# Patient Record
Sex: Male | Born: 1935 | Race: White | Hispanic: No | Marital: Married | Smoking: Former smoker
Health system: Southern US, Community
[De-identification: ages and names within clinical notes are randomized; demographics above are authoritative.]

## PROBLEM LIST (undated history)

## (undated) DIAGNOSIS — K219 Gastro-esophageal reflux disease without esophagitis: Secondary | ICD-10-CM

## (undated) DIAGNOSIS — I639 Cerebral infarction, unspecified: Secondary | ICD-10-CM

## (undated) DIAGNOSIS — R011 Cardiac murmur, unspecified: Secondary | ICD-10-CM

## (undated) DIAGNOSIS — F0390 Unspecified dementia without behavioral disturbance: Secondary | ICD-10-CM

## (undated) DIAGNOSIS — R319 Hematuria, unspecified: Secondary | ICD-10-CM

## (undated) DIAGNOSIS — R911 Solitary pulmonary nodule: Secondary | ICD-10-CM

## (undated) DIAGNOSIS — C801 Malignant (primary) neoplasm, unspecified: Secondary | ICD-10-CM

## (undated) HISTORY — DX: Solitary pulmonary nodule: R91.1

---

## 1968-08-31 HISTORY — PX: HERNIA REPAIR: SHX51

## 2004-08-31 HISTORY — PX: OTHER SURGICAL HISTORY: SHX169

## 2011-11-06 DIAGNOSIS — M5430 Sciatica, unspecified side: Secondary | ICD-10-CM | POA: Diagnosis not present

## 2011-11-06 DIAGNOSIS — M999 Biomechanical lesion, unspecified: Secondary | ICD-10-CM | POA: Diagnosis not present

## 2011-11-18 DIAGNOSIS — M9981 Other biomechanical lesions of cervical region: Secondary | ICD-10-CM | POA: Diagnosis not present

## 2011-11-18 DIAGNOSIS — M545 Low back pain: Secondary | ICD-10-CM | POA: Diagnosis not present

## 2011-11-18 DIAGNOSIS — M999 Biomechanical lesion, unspecified: Secondary | ICD-10-CM | POA: Diagnosis not present

## 2012-01-20 DIAGNOSIS — F068 Other specified mental disorders due to known physiological condition: Secondary | ICD-10-CM | POA: Diagnosis not present

## 2012-01-20 DIAGNOSIS — R454 Irritability and anger: Secondary | ICD-10-CM | POA: Diagnosis not present

## 2012-01-20 DIAGNOSIS — R5381 Other malaise: Secondary | ICD-10-CM | POA: Diagnosis not present

## 2012-01-21 DIAGNOSIS — C61 Malignant neoplasm of prostate: Secondary | ICD-10-CM | POA: Diagnosis not present

## 2012-01-21 DIAGNOSIS — R310 Gross hematuria: Secondary | ICD-10-CM | POA: Diagnosis not present

## 2012-04-06 DIAGNOSIS — H269 Unspecified cataract: Secondary | ICD-10-CM | POA: Diagnosis not present

## 2012-04-21 DIAGNOSIS — C61 Malignant neoplasm of prostate: Secondary | ICD-10-CM | POA: Diagnosis not present

## 2012-04-22 DIAGNOSIS — M999 Biomechanical lesion, unspecified: Secondary | ICD-10-CM | POA: Diagnosis not present

## 2012-04-22 DIAGNOSIS — M9981 Other biomechanical lesions of cervical region: Secondary | ICD-10-CM | POA: Diagnosis not present

## 2012-04-22 DIAGNOSIS — M545 Low back pain: Secondary | ICD-10-CM | POA: Diagnosis not present

## 2012-04-28 DIAGNOSIS — N529 Male erectile dysfunction, unspecified: Secondary | ICD-10-CM | POA: Diagnosis not present

## 2012-04-28 DIAGNOSIS — C61 Malignant neoplasm of prostate: Secondary | ICD-10-CM | POA: Diagnosis not present

## 2012-05-10 DIAGNOSIS — H2510 Age-related nuclear cataract, unspecified eye: Secondary | ICD-10-CM | POA: Diagnosis not present

## 2012-05-10 DIAGNOSIS — H52229 Regular astigmatism, unspecified eye: Secondary | ICD-10-CM | POA: Diagnosis not present

## 2012-05-23 DIAGNOSIS — H2510 Age-related nuclear cataract, unspecified eye: Secondary | ICD-10-CM | POA: Diagnosis not present

## 2012-06-02 DIAGNOSIS — H2510 Age-related nuclear cataract, unspecified eye: Secondary | ICD-10-CM | POA: Diagnosis not present

## 2012-06-29 DIAGNOSIS — M545 Low back pain: Secondary | ICD-10-CM | POA: Diagnosis not present

## 2012-06-29 DIAGNOSIS — M999 Biomechanical lesion, unspecified: Secondary | ICD-10-CM | POA: Diagnosis not present

## 2012-06-29 DIAGNOSIS — M9981 Other biomechanical lesions of cervical region: Secondary | ICD-10-CM | POA: Diagnosis not present

## 2012-09-09 DIAGNOSIS — N390 Urinary tract infection, site not specified: Secondary | ICD-10-CM | POA: Diagnosis not present

## 2012-09-09 DIAGNOSIS — E7800 Pure hypercholesterolemia, unspecified: Secondary | ICD-10-CM | POA: Diagnosis not present

## 2012-09-21 DIAGNOSIS — E785 Hyperlipidemia, unspecified: Secondary | ICD-10-CM | POA: Diagnosis not present

## 2012-09-21 DIAGNOSIS — Z0000 Encounter for general adult medical examination without abnormal findings: Secondary | ICD-10-CM | POA: Diagnosis not present

## 2012-09-21 DIAGNOSIS — R319 Hematuria, unspecified: Secondary | ICD-10-CM | POA: Diagnosis not present

## 2012-09-21 DIAGNOSIS — F068 Other specified mental disorders due to known physiological condition: Secondary | ICD-10-CM | POA: Diagnosis not present

## 2012-10-21 DIAGNOSIS — H01019 Ulcerative blepharitis unspecified eye, unspecified eyelid: Secondary | ICD-10-CM | POA: Diagnosis not present

## 2012-11-17 DIAGNOSIS — R42 Dizziness and giddiness: Secondary | ICD-10-CM | POA: Diagnosis not present

## 2012-11-17 DIAGNOSIS — R300 Dysuria: Secondary | ICD-10-CM | POA: Diagnosis not present

## 2012-11-17 DIAGNOSIS — N390 Urinary tract infection, site not specified: Secondary | ICD-10-CM | POA: Diagnosis not present

## 2013-01-03 DIAGNOSIS — H6980 Other specified disorders of Eustachian tube, unspecified ear: Secondary | ICD-10-CM | POA: Diagnosis not present

## 2013-01-04 DIAGNOSIS — H15059 Scleromalacia perforans, unspecified eye: Secondary | ICD-10-CM | POA: Diagnosis not present

## 2013-01-13 DIAGNOSIS — R269 Unspecified abnormalities of gait and mobility: Secondary | ICD-10-CM | POA: Diagnosis not present

## 2013-01-13 DIAGNOSIS — H903 Sensorineural hearing loss, bilateral: Secondary | ICD-10-CM | POA: Diagnosis not present

## 2013-01-13 DIAGNOSIS — J309 Allergic rhinitis, unspecified: Secondary | ICD-10-CM | POA: Diagnosis not present

## 2013-02-07 DIAGNOSIS — R1310 Dysphagia, unspecified: Secondary | ICD-10-CM | POA: Diagnosis not present

## 2013-02-20 DIAGNOSIS — L821 Other seborrheic keratosis: Secondary | ICD-10-CM | POA: Diagnosis not present

## 2013-02-20 DIAGNOSIS — D235 Other benign neoplasm of skin of trunk: Secondary | ICD-10-CM | POA: Diagnosis not present

## 2013-02-20 DIAGNOSIS — D485 Neoplasm of uncertain behavior of skin: Secondary | ICD-10-CM | POA: Diagnosis not present

## 2013-03-13 DIAGNOSIS — F0280 Dementia in other diseases classified elsewhere without behavioral disturbance: Secondary | ICD-10-CM | POA: Diagnosis not present

## 2013-03-25 DIAGNOSIS — H1130 Conjunctival hemorrhage, unspecified eye: Secondary | ICD-10-CM | POA: Diagnosis not present

## 2013-03-25 DIAGNOSIS — H01029 Squamous blepharitis unspecified eye, unspecified eyelid: Secondary | ICD-10-CM | POA: Diagnosis not present

## 2013-04-19 DIAGNOSIS — C61 Malignant neoplasm of prostate: Secondary | ICD-10-CM | POA: Diagnosis not present

## 2013-04-26 DIAGNOSIS — C61 Malignant neoplasm of prostate: Secondary | ICD-10-CM | POA: Diagnosis not present

## 2013-04-26 DIAGNOSIS — R310 Gross hematuria: Secondary | ICD-10-CM | POA: Diagnosis not present

## 2013-04-26 DIAGNOSIS — R351 Nocturia: Secondary | ICD-10-CM | POA: Diagnosis not present

## 2013-04-26 DIAGNOSIS — N529 Male erectile dysfunction, unspecified: Secondary | ICD-10-CM | POA: Diagnosis not present

## 2013-05-08 DIAGNOSIS — R1310 Dysphagia, unspecified: Secondary | ICD-10-CM | POA: Diagnosis not present

## 2013-05-09 DIAGNOSIS — Z23 Encounter for immunization: Secondary | ICD-10-CM | POA: Diagnosis not present

## 2013-05-15 DIAGNOSIS — H01009 Unspecified blepharitis unspecified eye, unspecified eyelid: Secondary | ICD-10-CM | POA: Diagnosis not present

## 2013-06-05 DIAGNOSIS — Z23 Encounter for immunization: Secondary | ICD-10-CM | POA: Diagnosis not present

## 2013-06-14 DIAGNOSIS — M545 Low back pain: Secondary | ICD-10-CM | POA: Diagnosis not present

## 2013-06-14 DIAGNOSIS — M9981 Other biomechanical lesions of cervical region: Secondary | ICD-10-CM | POA: Diagnosis not present

## 2013-06-14 DIAGNOSIS — M999 Biomechanical lesion, unspecified: Secondary | ICD-10-CM | POA: Diagnosis not present

## 2013-09-11 DIAGNOSIS — E7800 Pure hypercholesterolemia, unspecified: Secondary | ICD-10-CM | POA: Diagnosis not present

## 2013-09-20 DIAGNOSIS — R3129 Other microscopic hematuria: Secondary | ICD-10-CM | POA: Diagnosis not present

## 2013-09-20 DIAGNOSIS — R310 Gross hematuria: Secondary | ICD-10-CM | POA: Diagnosis not present

## 2013-09-20 DIAGNOSIS — C61 Malignant neoplasm of prostate: Secondary | ICD-10-CM | POA: Diagnosis not present

## 2013-09-20 DIAGNOSIS — R339 Retention of urine, unspecified: Secondary | ICD-10-CM | POA: Diagnosis not present

## 2013-09-25 DIAGNOSIS — G252 Other specified forms of tremor: Secondary | ICD-10-CM | POA: Diagnosis not present

## 2013-09-25 DIAGNOSIS — G250 Essential tremor: Secondary | ICD-10-CM | POA: Diagnosis not present

## 2013-09-25 DIAGNOSIS — E785 Hyperlipidemia, unspecified: Secondary | ICD-10-CM | POA: Diagnosis not present

## 2013-09-25 DIAGNOSIS — R011 Cardiac murmur, unspecified: Secondary | ICD-10-CM | POA: Diagnosis not present

## 2013-09-25 DIAGNOSIS — Z0000 Encounter for general adult medical examination without abnormal findings: Secondary | ICD-10-CM | POA: Diagnosis not present

## 2013-10-18 DIAGNOSIS — H01009 Unspecified blepharitis unspecified eye, unspecified eyelid: Secondary | ICD-10-CM | POA: Diagnosis not present

## 2013-12-25 DIAGNOSIS — R011 Cardiac murmur, unspecified: Secondary | ICD-10-CM | POA: Diagnosis not present

## 2013-12-25 DIAGNOSIS — E7800 Pure hypercholesterolemia, unspecified: Secondary | ICD-10-CM | POA: Diagnosis not present

## 2013-12-25 DIAGNOSIS — F0280 Dementia in other diseases classified elsewhere without behavioral disturbance: Secondary | ICD-10-CM | POA: Diagnosis not present

## 2013-12-25 DIAGNOSIS — G309 Alzheimer's disease, unspecified: Secondary | ICD-10-CM | POA: Diagnosis not present

## 2014-01-17 DIAGNOSIS — H01009 Unspecified blepharitis unspecified eye, unspecified eyelid: Secondary | ICD-10-CM | POA: Diagnosis not present

## 2014-01-23 DIAGNOSIS — H16229 Keratoconjunctivitis sicca, not specified as Sjogren's, unspecified eye: Secondary | ICD-10-CM | POA: Diagnosis not present

## 2014-04-27 ENCOUNTER — Other Ambulatory Visit: Payer: Self-pay | Admitting: Geriatric Medicine

## 2014-04-27 DIAGNOSIS — R1310 Dysphagia, unspecified: Secondary | ICD-10-CM | POA: Diagnosis not present

## 2014-04-30 ENCOUNTER — Ambulatory Visit
Admission: RE | Admit: 2014-04-30 | Discharge: 2014-04-30 | Disposition: A | Discharge status: Home / Self Care | Payer: Medicare Other | Source: Ambulatory Visit | Attending: Geriatric Medicine | Admitting: Geriatric Medicine

## 2014-04-30 DIAGNOSIS — K449 Diaphragmatic hernia without obstruction or gangrene: Secondary | ICD-10-CM | POA: Diagnosis not present

## 2014-04-30 DIAGNOSIS — R1310 Dysphagia, unspecified: Secondary | ICD-10-CM

## 2014-06-19 DIAGNOSIS — C61 Malignant neoplasm of prostate: Secondary | ICD-10-CM | POA: Diagnosis not present

## 2014-06-26 DIAGNOSIS — E780 Pure hypercholesterolemia: Secondary | ICD-10-CM | POA: Diagnosis not present

## 2014-06-26 DIAGNOSIS — Z79899 Other long term (current) drug therapy: Secondary | ICD-10-CM | POA: Diagnosis not present

## 2014-06-26 DIAGNOSIS — Z0000 Encounter for general adult medical examination without abnormal findings: Secondary | ICD-10-CM | POA: Diagnosis not present

## 2014-06-26 DIAGNOSIS — F0280 Dementia in other diseases classified elsewhere without behavioral disturbance: Secondary | ICD-10-CM | POA: Diagnosis not present

## 2014-06-26 DIAGNOSIS — R319 Hematuria, unspecified: Secondary | ICD-10-CM | POA: Diagnosis not present

## 2014-06-26 DIAGNOSIS — C61 Malignant neoplasm of prostate: Secondary | ICD-10-CM | POA: Diagnosis not present

## 2014-06-26 DIAGNOSIS — G301 Alzheimer's disease with late onset: Secondary | ICD-10-CM | POA: Diagnosis not present

## 2014-06-26 DIAGNOSIS — K219 Gastro-esophageal reflux disease without esophagitis: Secondary | ICD-10-CM | POA: Diagnosis not present

## 2014-06-26 DIAGNOSIS — Z1389 Encounter for screening for other disorder: Secondary | ICD-10-CM | POA: Diagnosis not present

## 2014-06-26 DIAGNOSIS — N210 Calculus in bladder: Secondary | ICD-10-CM | POA: Diagnosis not present

## 2014-06-27 ENCOUNTER — Other Ambulatory Visit: Payer: Self-pay | Admitting: Gastroenterology

## 2014-06-27 DIAGNOSIS — Z23 Encounter for immunization: Secondary | ICD-10-CM | POA: Diagnosis not present

## 2014-06-27 DIAGNOSIS — K222 Esophageal obstruction: Secondary | ICD-10-CM | POA: Diagnosis not present

## 2014-07-03 ENCOUNTER — Ambulatory Visit (HOSPITAL_COMMUNITY)
Admission: RE | Admit: 2014-07-03 | Discharge: 2014-07-03 | Disposition: A | Discharge status: Home / Self Care | Payer: Medicare Other | Source: Ambulatory Visit | Attending: Gastroenterology | Admitting: Gastroenterology

## 2014-07-03 ENCOUNTER — Encounter (HOSPITAL_COMMUNITY)
Admission: RE | Disposition: A | Discharge status: Home / Self Care | Payer: Self-pay | Source: Ambulatory Visit | Attending: Gastroenterology

## 2014-07-03 ENCOUNTER — Encounter (HOSPITAL_COMMUNITY): Payer: Self-pay | Admitting: *Deleted

## 2014-07-03 DIAGNOSIS — K222 Esophageal obstruction: Secondary | ICD-10-CM | POA: Diagnosis not present

## 2014-07-03 DIAGNOSIS — R1319 Other dysphagia: Secondary | ICD-10-CM | POA: Diagnosis not present

## 2014-07-03 DIAGNOSIS — G309 Alzheimer's disease, unspecified: Secondary | ICD-10-CM | POA: Diagnosis not present

## 2014-07-03 DIAGNOSIS — K219 Gastro-esophageal reflux disease without esophagitis: Secondary | ICD-10-CM | POA: Diagnosis not present

## 2014-07-03 DIAGNOSIS — K449 Diaphragmatic hernia without obstruction or gangrene: Secondary | ICD-10-CM | POA: Insufficient documentation

## 2014-07-03 DIAGNOSIS — Z8546 Personal history of malignant neoplasm of prostate: Secondary | ICD-10-CM | POA: Diagnosis not present

## 2014-07-03 DIAGNOSIS — F0280 Dementia in other diseases classified elsewhere without behavioral disturbance: Secondary | ICD-10-CM | POA: Insufficient documentation

## 2014-07-03 DIAGNOSIS — E780 Pure hypercholesterolemia: Secondary | ICD-10-CM | POA: Insufficient documentation

## 2014-07-03 HISTORY — PX: ESOPHAGOGASTRODUODENOSCOPY: SHX5428

## 2014-07-03 HISTORY — DX: Gastro-esophageal reflux disease without esophagitis: K21.9

## 2014-07-03 HISTORY — DX: Unspecified dementia, unspecified severity, without behavioral disturbance, psychotic disturbance, mood disturbance, and anxiety: F03.90

## 2014-07-03 HISTORY — DX: Cardiac murmur, unspecified: R01.1

## 2014-07-03 HISTORY — DX: Cerebral infarction, unspecified: I63.9

## 2014-07-03 HISTORY — DX: Malignant (primary) neoplasm, unspecified: C80.1

## 2014-07-03 HISTORY — PX: BALLOON DILATION: SHX5330

## 2014-07-03 SURGERY — EGD (ESOPHAGOGASTRODUODENOSCOPY)
Anesthesia: Moderate Sedation

## 2014-07-03 MED ORDER — MIDAZOLAM HCL 10 MG/2ML IJ SOLN
INTRAMUSCULAR | Status: AC
  Filled 2014-07-03: qty 2

## 2014-07-03 MED ORDER — FENTANYL CITRATE 0.05 MG/ML IJ SOLN
INTRAMUSCULAR | Status: DC | PRN
  Administered 2014-07-03: 25 ug via INTRAVENOUS

## 2014-07-03 MED ORDER — BUTAMBEN-TETRACAINE-BENZOCAINE 2-2-14 % EX AERO
INHALATION_SPRAY | CUTANEOUS | Status: DC | PRN
Start: 2014-07-03 — End: 2014-07-03
  Administered 2014-07-03: 2 via TOPICAL

## 2014-07-03 MED ORDER — SODIUM CHLORIDE 0.9 % IV SOLN
INTRAVENOUS | Status: DC
Start: 2014-07-03 — End: 2014-07-03

## 2014-07-03 MED ORDER — MIDAZOLAM HCL 10 MG/2ML IJ SOLN
INTRAMUSCULAR | Status: DC | PRN
Start: 2014-07-03 — End: 2014-07-03
  Administered 2014-07-03: 2.5 mg via INTRAVENOUS

## 2014-07-03 MED ORDER — FENTANYL CITRATE 0.05 MG/ML IJ SOLN
INTRAMUSCULAR | Status: AC
  Filled 2014-07-03: qty 2

## 2014-07-03 NOTE — H&P (Signed)
  Problem: Esophageal dysphagia. 04/30/2014 barium esophagram showed a distal esophageal stricture above a hiatal hernia. Alzheimer's type dementia.  History: The patient is an 80 year old male born in 09/03/1925. He has Alzheimer's type dementia. He required esophageal dilation over 15 years ago.  When the patient developed esophageal dysphagia without odynophagia, he was referred to the radiology suite for a barium esophagram which showed a distal esophageal stricture which the 13 mm barium tablet did not traverse.  The patient is scheduled to undergo diagnostic esophagogastroduodenoscopy with esophageal stricture dilation.  Past medical history: Hypercholesterolemia. Alzheimer's type dementia. Gastroesophageal reflux. Prostate cancer. Bilateral inguinal hernia repair. Rotator cuff surgery. Prostate cancer surgery. Traumatic loss of the left third finger.  Medication allergies: None  Exam: The patient is alert and lying comfortably on the endoscopy stretcher. Lungs are clear to auscultation. Cardiac exam reveals a regular rhythm. Abdomen is soft and nontender to palpation.  Plan: Proceed with diagnostic esophagogastroduodenoscopy with esophageal stricture dilation

## 2014-07-03 NOTE — Op Note (Signed)
Problem: Esophageal dysphagia. 04/30/2014 barium esophagram showed a distal esophageal stricture  Endoscopist: Earle Gell  Premedication: Versed 2.5 mg. Fentanyl 25 g.  Procedure: Diagnostic esophagogastroduodenoscopy with esophageal balloon dilation of the benign stricture at the esophagogastric junction The patient was placed in the left lateral decubitus position. The Pentax gastroscope was passed through the posterior hypopharynx into the proximal esophagus without difficulty. The hypopharynx, larynx, and vocal cords appeared normal.  Esophagoscopy: The proximal and mid segments of the esophageal mucosa appeared normal. The squamocolumnar junction was noted at 40 cm from the incisor teeth. At the esophagogastric junction there was a benign stricture extending less than 1 cm in length. I was easily able to traverse the stricture with the gastroscope and entered the stomach.  Gastroscopy: There was a small hiatal hernia. Retroflexed view of the gastric cardia and fundus was normal. The gastric body, antrum, and pylorus appeared normal.  Duodenoscopy: The duodenal bulb and descending duodenum appeared normal.  Esophageal stricture dilation. Using the esophageal balloon dilator, the benign stricture at the esophagogastric junction was dilated from 15 mm to 16.5 mm without apparent complications.  Assessment: Hiatal hernia associated with a benign stricture at the esophagogastric junction dilated to 16.5 mm using the esophageal balloon dilator.

## 2014-07-03 NOTE — Op Note (Signed)
Procedure: Diagnostic esophagogastroduodenoscopy to be sure the patient did not swallow his partial dentures  Endoscopist: Earle Gell  Premedication: None  Procedure: The patient was placed in the left lateral decubitus position. The Pentax gastroscope was passed through the posterior hypopharynx into the proximal esophagus without difficulty. The hypopharynx, larynx, and vocal cords appeared normal. Close inspection of the form sinuses showed no foreign bodies.  Esophagoscopy: The proximal, mid, and lower segments of the esophageal mucosa appeared normal post dilation of the benign stricture at the esophagogastric junction noted at 40 cm from the incisor teeth. No active  bleeding or apparent perforation at the dilation site.  Gastroscopy: There is a small hiatal hernia. Retroflexed view of the gastric cardia and fundus was normal. The gastric body, antrum, and pylorus appeared normal. No foreign bodies were noted in the stomach.  Duodenoscopy: The duodenal bulb and descending duodenum appeared normal.  Assessment: Normal esophagogastroduodenoscopy post benign distal esophageal stricture dilation.

## 2014-07-03 NOTE — Discharge Instructions (Signed)
Esophagogastroduodenoscopy °Care After °Refer to this sheet in the next few weeks. These instructions provide you with information on caring for yourself after your procedure. Your caregiver may also give you more specific instructions. Your treatment has been planned according to current medical practices, but problems sometimes occur. Call your caregiver if you have any problems or questions after your procedure.  °HOME CARE INSTRUCTIONS °· Do not eat or drink anything until the numbing medicine (local anesthetic) has worn off and your gag reflex has returned. You will know that the local anesthetic has worn off when you can swallow comfortably. °· Do not drive for 12 hours after the procedure or as directed by your caregiver. °· Only take medicines as directed by your caregiver. °SEEK MEDICAL CARE IF:  °· You cannot stop coughing. °· You are not urinating at all or less than usual. °SEEK IMMEDIATE MEDICAL CARE IF: °· You have difficulty swallowing. °· You cannot eat or drink. °· You have worsening throat or chest pain. °· You have dizziness, lightheadedness, or you faint. °· You have nausea or vomiting. °· You have chills. °· You have a fever. °· You have severe abdominal pain. °· You have black, tarry, or bloody stools. °Document Released: 08/03/2012 Document Reviewed: 08/03/2012 °[employer]® Patient Information ©2015 [employer]. This information is not intended to replace advice given to you by your health care provider. Make sure you discuss any questions you have with your health care provider. ° °

## 2014-07-04 ENCOUNTER — Encounter (HOSPITAL_COMMUNITY): Payer: Self-pay | Admitting: Gastroenterology

## 2014-08-06 DIAGNOSIS — C61 Malignant neoplasm of prostate: Secondary | ICD-10-CM | POA: Diagnosis not present

## 2014-08-06 DIAGNOSIS — R310 Gross hematuria: Secondary | ICD-10-CM | POA: Diagnosis not present

## 2014-08-10 DIAGNOSIS — L821 Other seborrheic keratosis: Secondary | ICD-10-CM | POA: Diagnosis not present

## 2014-08-10 DIAGNOSIS — L720 Epidermal cyst: Secondary | ICD-10-CM | POA: Diagnosis not present

## 2014-08-17 ENCOUNTER — Observation Stay (HOSPITAL_COMMUNITY): Payer: Medicare Other

## 2014-08-17 ENCOUNTER — Observation Stay (HOSPITAL_COMMUNITY)
Admission: EM | Admit: 2014-08-17 | Discharge: 2014-08-19 | Disposition: A | Discharge status: Home Health | Payer: Medicare Other | Attending: Internal Medicine | Admitting: Internal Medicine

## 2014-08-17 ENCOUNTER — Emergency Department (HOSPITAL_COMMUNITY): Payer: Medicare Other

## 2014-08-17 ENCOUNTER — Encounter (HOSPITAL_COMMUNITY): Payer: Self-pay | Admitting: Emergency Medicine

## 2014-08-17 DIAGNOSIS — R911 Solitary pulmonary nodule: Secondary | ICD-10-CM

## 2014-08-17 DIAGNOSIS — K219 Gastro-esophageal reflux disease without esophagitis: Secondary | ICD-10-CM | POA: Diagnosis not present

## 2014-08-17 DIAGNOSIS — I350 Nonrheumatic aortic (valve) stenosis: Secondary | ICD-10-CM | POA: Diagnosis not present

## 2014-08-17 DIAGNOSIS — R001 Bradycardia, unspecified: Secondary | ICD-10-CM | POA: Diagnosis not present

## 2014-08-17 DIAGNOSIS — Z66 Do not resuscitate: Secondary | ICD-10-CM | POA: Insufficient documentation

## 2014-08-17 DIAGNOSIS — R55 Syncope and collapse: Secondary | ICD-10-CM | POA: Diagnosis not present

## 2014-08-17 DIAGNOSIS — R42 Dizziness and giddiness: Secondary | ICD-10-CM | POA: Insufficient documentation

## 2014-08-17 DIAGNOSIS — R61 Generalized hyperhidrosis: Secondary | ICD-10-CM | POA: Diagnosis not present

## 2014-08-17 DIAGNOSIS — Z8546 Personal history of malignant neoplasm of prostate: Secondary | ICD-10-CM | POA: Insufficient documentation

## 2014-08-17 DIAGNOSIS — E43 Unspecified severe protein-calorie malnutrition: Secondary | ICD-10-CM | POA: Diagnosis not present

## 2014-08-17 DIAGNOSIS — F0280 Dementia in other diseases classified elsewhere without behavioral disturbance: Secondary | ICD-10-CM | POA: Insufficient documentation

## 2014-08-17 DIAGNOSIS — F0390 Unspecified dementia without behavioral disturbance: Secondary | ICD-10-CM | POA: Diagnosis not present

## 2014-08-17 DIAGNOSIS — R634 Abnormal weight loss: Secondary | ICD-10-CM | POA: Diagnosis not present

## 2014-08-17 DIAGNOSIS — Z8673 Personal history of transient ischemic attack (TIA), and cerebral infarction without residual deficits: Secondary | ICD-10-CM | POA: Insufficient documentation

## 2014-08-17 DIAGNOSIS — I6782 Cerebral ischemia: Secondary | ICD-10-CM | POA: Diagnosis not present

## 2014-08-17 DIAGNOSIS — R918 Other nonspecific abnormal finding of lung field: Secondary | ICD-10-CM | POA: Diagnosis not present

## 2014-08-17 DIAGNOSIS — R0602 Shortness of breath: Secondary | ICD-10-CM | POA: Diagnosis not present

## 2014-08-17 DIAGNOSIS — G309 Alzheimer's disease, unspecified: Secondary | ICD-10-CM | POA: Insufficient documentation

## 2014-08-17 HISTORY — DX: Solitary pulmonary nodule: R91.1

## 2014-08-17 LAB — BASIC METABOLIC PANEL
Anion gap: 10 (ref 5–15)
BUN: 12 mg/dL (ref 6–23)
CO2: 28 meq/L (ref 19–32)
CREATININE: 0.71 mg/dL (ref 0.50–1.35)
Calcium: 9.6 mg/dL (ref 8.4–10.5)
Chloride: 104 mEq/L (ref 96–112)
GFR calc Af Amer: >90 mL/min (ref >90)
GFR calc non Af Amer: 83 mL/min — ABNORMAL LOW (ref >90)
Glucose, Bld: 94 mg/dL (ref 70–99)
POTASSIUM: 4 meq/L (ref 3.7–5.3)
Sodium: 142 mEq/L (ref 137–147)

## 2014-08-17 LAB — CBC WITH DIFFERENTIAL/PLATELET
Basophils Absolute: 0 10*3/uL (ref 0.0–0.1)
Basophils Relative: 0 % (ref 0–1)
EOS PCT: 1 % (ref 0–5)
Eosinophils Absolute: 0 10*3/uL (ref 0.0–0.7)
HCT: 47.7 % (ref 39.0–52.0)
Hemoglobin: 15.9 g/dL (ref 13.0–17.0)
LYMPHS ABS: 1.2 10*3/uL (ref 0.7–4.0)
LYMPHS PCT: 18 % (ref 12–46)
MCH: 30.2 pg (ref 26.0–34.0)
MCHC: 33.3 g/dL (ref 30.0–36.0)
MCV: 90.7 fL (ref 78.0–100.0)
Monocytes Absolute: 0.5 10*3/uL (ref 0.1–1.0)
Monocytes Relative: 7 % (ref 3–12)
NEUTROS ABS: 4.8 10*3/uL (ref 1.7–7.7)
NEUTROS PCT: 74 % (ref 43–77)
Platelets: 161 10*3/uL (ref 150–400)
RBC: 5.26 MIL/uL (ref 4.22–5.81)
RDW: 13.1 % (ref 11.5–15.5)
WBC: 6.5 10*3/uL (ref 4.0–10.5)

## 2014-08-17 LAB — URINALYSIS, ROUTINE W REFLEX MICROSCOPIC
Bilirubin Urine: NEGATIVE
Glucose, UA: NEGATIVE mg/dL
KETONES UR: NEGATIVE mg/dL
LEUKOCYTES UA: NEGATIVE
Nitrite: NEGATIVE
PH: 7 (ref 5.0–8.0)
Protein, ur: NEGATIVE mg/dL
Specific Gravity, Urine: 1.011 (ref 1.005–1.030)
Urobilinogen, UA: 0.2 mg/dL (ref 0.0–1.0)

## 2014-08-17 LAB — URINE MICROSCOPIC-ADD ON

## 2014-08-17 LAB — I-STAT TROPONIN, ED: Troponin i, poc: 0 ng/mL (ref 0.00–0.08)

## 2014-08-17 LAB — TSH: TSH: 3.19 u[IU]/mL (ref 0.350–4.500)

## 2014-08-17 LAB — TROPONIN I: Troponin I: <0.3 ng/mL (ref <0.30)

## 2014-08-17 MED ORDER — ENOXAPARIN SODIUM 40 MG/0.4ML ~~LOC~~ SOLN
40.0000 mg | SUBCUTANEOUS | Status: DC
  Administered 2014-08-18: 40 mg via SUBCUTANEOUS
  Filled 2014-08-17 (×2): qty 0.4

## 2014-08-17 MED ORDER — MEMANTINE HCL 10 MG PO TABS
10.0000 mg | ORAL_TABLET | Freq: Two times a day (BID) | ORAL | Status: DC
  Administered 2014-08-17 – 2014-08-19 (×4): 10 mg via ORAL
  Filled 2014-08-17 (×5): qty 1

## 2014-08-17 MED ORDER — SODIUM CHLORIDE 0.9 % IJ SOLN: 3.0000 mL | INTRAMUSCULAR | Status: DC | PRN

## 2014-08-17 MED ORDER — ACETAMINOPHEN 325 MG PO TABS: 650.0000 mg | ORAL_TABLET | Freq: Four times a day (QID) | ORAL | Status: DC | PRN

## 2014-08-17 MED ORDER — SODIUM CHLORIDE 0.9 % IJ SOLN
3.0000 mL | Freq: Two times a day (BID) | INTRAMUSCULAR | Status: DC
  Administered 2014-08-17 – 2014-08-18 (×3): 3 mL via INTRAVENOUS

## 2014-08-17 MED ORDER — ALBUTEROL SULFATE (2.5 MG/3ML) 0.083% IN NEBU: 2.5000 mg | INHALATION_SOLUTION | RESPIRATORY_TRACT | Status: DC | PRN

## 2014-08-17 MED ORDER — VITAMIN B-12 100 MCG PO TABS
100.0000 ug | ORAL_TABLET | Freq: Every day | ORAL | Status: DC
  Administered 2014-08-18 – 2014-08-19 (×2): 100 ug via ORAL
  Filled 2014-08-17 (×2): qty 1

## 2014-08-17 MED ORDER — SODIUM CHLORIDE 0.9 % IJ SOLN
3.0000 mL | Freq: Two times a day (BID) | INTRAMUSCULAR | Status: DC
  Administered 2014-08-17 – 2014-08-19 (×2): 3 mL via INTRAVENOUS

## 2014-08-17 MED ORDER — ENOXAPARIN SODIUM 40 MG/0.4ML ~~LOC~~ SOLN
40.0000 mg | SUBCUTANEOUS | Status: DC
  Administered 2014-08-17: 40 mg via SUBCUTANEOUS
  Filled 2014-08-17: qty 0.4

## 2014-08-17 MED ORDER — SODIUM CHLORIDE 0.9 % IV SOLN: 250.0000 mL | INTRAVENOUS | Status: DC | PRN

## 2014-08-17 MED ORDER — PANTOPRAZOLE SODIUM 40 MG PO TBEC
40.0000 mg | DELAYED_RELEASE_TABLET | Freq: Every day | ORAL | Status: DC
  Administered 2014-08-18 – 2014-08-19 (×2): 40 mg via ORAL
  Filled 2014-08-17 (×2): qty 1

## 2014-08-17 MED ORDER — ONDANSETRON HCL 4 MG/2ML IJ SOLN: 4.0000 mg | Freq: Four times a day (QID) | INTRAMUSCULAR | Status: DC | PRN

## 2014-08-17 MED ORDER — SIMVASTATIN 40 MG PO TABS
40.0000 mg | ORAL_TABLET | Freq: Every day | ORAL | Status: DC
  Administered 2014-08-17 – 2014-08-18 (×2): 40 mg via ORAL
  Filled 2014-08-17 (×4): qty 1

## 2014-08-17 MED ORDER — ONDANSETRON HCL 4 MG PO TABS: 4.0000 mg | ORAL_TABLET | Freq: Four times a day (QID) | ORAL | Status: DC | PRN

## 2014-08-17 MED ORDER — ACETAMINOPHEN 650 MG RE SUPP: 650.0000 mg | Freq: Four times a day (QID) | RECTAL | Status: DC | PRN

## 2014-08-17 NOTE — ED Notes (Signed)
Admitting MD at bedside.

## 2014-08-17 NOTE — ED Notes (Signed)
Pt c/o dizziness and diaphoresis with htn today after lunch

## 2014-08-17 NOTE — ED Provider Notes (Signed)
CSN: [id number]     Arrival date & time 08/17/14  1312 History   First MD Initiated Contact with Patient 08/17/14 1348     Chief Complaint  Patient presents with  . Dizziness   Level V caveat due to dementia  (Consider location/radiation/quality/duration/timing/severity/associated sxs/prior Treatment) Patient is a 80 y.o. male presenting with dizziness. The history is provided by the patient.  Dizziness patient with episode of flushing of his face and diaphoresis while he was eating today.  Reportedly had high blood pressure with it. Did not feel like his previous vertigo. No chest pain. No lightheadedness. Reportedly had his face wiped and had to ring out the rag. No numbness weakness. He feels better now. It lasted around an hour. He's been doing well otherwise. He does have a history of Alzheimer's.  Past Medical History  Diagnosis Date  . GERD (gastroesophageal reflux disease)   . Heart murmur     systolic murmur no further eval d/t Alzheimers dx  . Stroke     TIA  . Dementia     Alzheimers  . Cancer     Prostate   Past Surgical History  Procedure Laterality Date  . Esophagogastroduodenoscopy [name] 07/03/2014    Procedure: ESOPHAGOGASTRODUODENOSCOPY (EGD);  Surgeon: Garlan Fair, MD;  Location: [facility];  Service: Endoscopy;  Laterality: [name];  . Balloon dilation [name] 07/03/2014    Procedure: BALLOON DILATION;  Surgeon: Garlan Fair, MD;  Location: [facility];  Service: Endoscopy;  Laterality: [name];   History reviewed. No pertinent family history. History  Substance Use Topics  . Smoking status: Never Smoker   . Smokeless tobacco: Not on file  . Alcohol Use: No    Review of Systems  Unable to perform ROS Neurological: Positive for dizziness.      Allergies  Review of patient's allergies indicates no known allergies.  Home Medications   Prior to Admission medications   Medication Sig Start Date End Date Taking? Authorizing Provider  beta carotene  w/minerals (OCUVITE) tablet Take 1 tablet by mouth daily.   Yes Historical Provider, MD  co-enzyme Q-10 30 MG capsule Take 30 mg by mouth daily.   Yes Historical Provider, MD  cyanocobalamin 1000 MCG tablet Take 100 mcg by mouth daily.   Yes Historical Provider, MD  donepezil (ARICEPT) 10 MG tablet Take 10 mg by mouth at bedtime.   Yes Historical Provider, MD  LOTEMAX 0.5 % GEL Place 1 drop into the left eye as needed (pain).  08/05/14  Yes Historical Provider, MD  memantine (NAMENDA) 10 MG tablet Take 10 mg by mouth 2 (two) times daily.   Yes Historical Provider, MD  omeprazole (PRILOSEC) 20 MG capsule Take 20 mg by mouth daily.   Yes Historical Provider, MD  simvastatin (ZOCOR) 80 MG tablet Take 40 mg by mouth daily.  08/06/14  Yes Historical Provider, MD   BP 150/69 mmHg  Pulse 57  Temp(Src) 97.3 F (36.3 C) (Oral)  Resp 18  SpO2 100% Physical Exam  Constitutional: He appears well-developed and well-nourished.  HENT:  Head: Normocephalic.  Neck: Neck supple.  Cardiovascular:  Mild bradycardia  Pulmonary/Chest: Effort normal.  Abdominal: Soft. Bowel sounds are normal.  Musculoskeletal: Normal range of motion.  Neurological: He is alert.  Awake and appropriate with some dementia.  Skin: Skin is warm.    ED Course  Procedures (including critical care time) Labs Review Labs Reviewed  BASIC METABOLIC PANEL - Abnormal; Notable for the following:    GFR calc  non Af Amer 83 (*)    All other components within normal limits  CBC WITH DIFFERENTIAL  URINALYSIS, ROUTINE W REFLEX MICROSCOPIC  I-STAT TROPOININ, ED    Imaging Review Dg Chest 2 View  08/17/2014   CLINICAL DATA:  Dizziness, no chest complaints, shortness of breath or cough at times. Diaphoresis.  EXAM: CHEST  2 VIEW  COMPARISON:  None.  FINDINGS: 8 mm nodular opacity projecting over the left lower lung. There is no focal parenchymal opacity, pleural effusion, or pneumothorax. The heart and mediastinal contours are  unremarkable.  The osseous structures are unremarkable.  IMPRESSION: No active cardiopulmonary disease.  8 mm nodular opacity projecting over the left lower lung. Recommend further evaluation with a nonemergent CT of the chest.   Electronically Signed   By: Kathreen Devoid   On: 08/17/2014 15:13     EKG Interpretation   Date/Time:  Friday August 17 2014 13:29:59 EST Ventricular Rate:  49 PR Interval:  218 QRS Duration: 86 QT Interval:  442 QTC Calculation: 399 R Axis:   40 Text Interpretation:  Sinus bradycardia with 1st degree A-V block  Otherwise normal ECG Confirmed by Alvino Chapel  MD, Ovid Curd [phone]) on  08/17/2014 1:47:41 PM      MDM   Final diagnoses:  Diaphoresis    Patient with diaphoresis. EKg reassuring. No real reason found. Labs reassuring. Will obs patient for monitoring.    Jasper Riling. Alvino Chapel, MD 08/17/14 [phone]

## 2014-08-17 NOTE — H&P (Signed)
[facility] History and Physical  PACO CISLO ZOX:[id number] DOB: Jan 30, 1926 DOA: 08/17/2014   PCP: Mathews Argyle, MD  Specialists: Patient is followed by urology for history of prostate cancer. Followed by gastroenterology. He underwent EGD with esophageal dilatation for stricture in November.  Chief Complaint: "I had a hot flash"  HPI: [name] is a 80 y.o. male with a past history of for dementia, who lives with his wife. And, who goes to an adult care facility for exercises and social activities. He was in this facility this morning when he was sitting and then he felt as if he was having a hot flash. He felt diaphoretic. He was sweating. He got up. Didn't feel any better. Felt dizzy. Felt as if he was going to pass out. He sat back down. Denies any chest pain or shortness of breath. No focal weakness. History is limited due to his dementia. He also mentioned that he at times he saw flashing lights. Denies any other visual disturbances. No headaches. No nausea, vomiting. No discomfort with urination. He does have a history of hematuria for which he has been seen by urology in the past. He says he did not miss his breakfast this morning. Denies any fever or chills. No recent travel. No sick contacts. No palpitations. Denies having had this kind of problem before. He is accompanied by his wife. He and his wife mentioned that he's lost about 6-7 pounds over the last few months.  Home Medications: Prior to Admission medications   Medication Sig Start Date End Date Taking? Authorizing Provider  beta carotene w/minerals (OCUVITE) tablet Take 1 tablet by mouth daily.   Yes Historical Provider, MD  co-enzyme Q-10 30 MG capsule Take 30 mg by mouth daily.   Yes Historical Provider, MD  cyanocobalamin 1000 MCG tablet Take 100 mcg by mouth daily.   Yes Historical Provider, MD  donepezil (ARICEPT) 10 MG tablet Take 10 mg by mouth at bedtime.   Yes Historical Provider, MD  LOTEMAX  0.5 % GEL Place 1 drop into the left eye as needed (pain).  08/05/14  Yes Historical Provider, MD  memantine (NAMENDA) 10 MG tablet Take 10 mg by mouth 2 (two) times daily.   Yes Historical Provider, MD  omeprazole (PRILOSEC) 20 MG capsule Take 20 mg by mouth daily.   Yes Historical Provider, MD  simvastatin (ZOCOR) 80 MG tablet Take 40 mg by mouth daily.  08/06/14  Yes Historical Provider, MD    Allergies: No Known Allergies  Past Medical History: Past Medical History  Diagnosis Date  . GERD (gastroesophageal reflux disease)   . Heart murmur     systolic murmur no further eval d/t Alzheimers dx  . Stroke     TIA  . Dementia     Alzheimers  . Cancer     Prostate    Past Surgical History  Procedure Laterality Date  . Esophagogastroduodenoscopy [name] 07/03/2014    Procedure: ESOPHAGOGASTRODUODENOSCOPY (EGD);  Surgeon: Garlan Fair, MD;  Location: [facility];  Service: Endoscopy;  Laterality: [name];  . Balloon dilation [name] 07/03/2014    Procedure: BALLOON DILATION;  Surgeon: Garlan Fair, MD;  Location: [facility];  Service: Endoscopy;  Laterality: [name];    Social History: He lives in [place] with his wife. Quit smoking about 40-50 years ago. Very occasional beer intake. Doesn't need any assistive devices, but his level of activity has reduced over the last many months. He does have advance directives and is a  DO NOT RESUSCITATE. He has living will as well.  Family History:  Family History  Problem Relation Age of Onset  . Heart disease Mother      Review of Systems - unable to do due to his dementia  Physical Examination  Temperature 97.3. Heart rate 57. Respiratory rate 18. Blood pressure 150/69. Saturations 100% on room air.  General appearance: alert, cooperative, appears stated age, distracted and no distress Head: Normocephalic, without obvious abnormality, atraumatic Eyes: conjunctivae/corneas clear. PERRL, EOM's intact. Throat: lips, mucosa, and tongue  normal; teeth and gums normal Neck: no adenopathy, no carotid bruit, no JVD, supple, symmetrical, trachea midline and thyroid not enlarged, symmetric, no tenderness/mass/nodules Resp: clear to auscultation bilaterally Cardio: S1, S2, is bradycardic, regular. No S3, S4. Systolic murmur appreciated over the precordium. No pedal edema. No rubs, or bruit. GI: soft, non-tender; bowel sounds normal; no masses,  no organomegaly Extremities: extremities normal, atraumatic, no cyanosis or edema Pulses: 2+ and symmetric Skin: Skin color, texture, turgor normal. No rashes or lesions Neurologic: He is alert. He is oriented to his wife. No cranial nerve deficits 2-12. Motor strength is equal bilateral upper and lower extremities.  Laboratory Data: Results for orders placed or performed during the hospital encounter of 08/17/14 (from the past 48 hour(s))  Basic metabolic panel     Status: Abnormal   Collection Time: 08/17/14  1:27 PM  Result Value Ref Range   Sodium 142 137 - 147 mEq/L   Potassium 4.0 3.7 - 5.3 mEq/L   Chloride 104 96 - 112 mEq/L   CO2 28 19 - 32 mEq/L   Glucose, Bld 94 70 - 99 mg/dL   BUN 12 6 - 23 mg/dL   Creatinine, Ser 0.71 0.50 - 1.35 mg/dL   Calcium 9.6 8.4 - 10.5 mg/dL   GFR calc non Af Amer 83 (L) >90 mL/min   GFR calc Af Amer >90 >90 mL/min    Comment: (NOTE) The eGFR has been calculated using the CKD EPI equation. This calculation has not been validated in all clinical situations. eGFR's persistently <90 mL/min signify possible Chronic Kidney Disease.    Anion gap 10 5 - 15  CBC with Differential     Status: None   Collection Time: 08/17/14  1:27 PM  Result Value Ref Range   WBC 6.5 4.0 - 10.5 K/uL   RBC 5.26 4.22 - 5.81 MIL/uL   Hemoglobin 15.9 13.0 - 17.0 g/dL   HCT 47.7 39.0 - 52.0 %   MCV 90.7 78.0 - 100.0 fL   MCH 30.2 26.0 - 34.0 pg   MCHC 33.3 30.0 - 36.0 g/dL   RDW 13.1 11.5 - 15.5 %   Platelets 161 150 - 400 K/uL   Neutrophils Relative % 74 43 - 77 %     Neutro Abs 4.8 1.7 - 7.7 K/uL   Lymphocytes Relative 18 12 - 46 %   Lymphs Abs 1.2 0.7 - 4.0 K/uL   Monocytes Relative 7 3 - 12 %   Monocytes Absolute 0.5 0.1 - 1.0 K/uL   Eosinophils Relative 1 0 - 5 %   Eosinophils Absolute 0.0 0.0 - 0.7 K/uL   Basophils Relative 0 0 - 1 %   Basophils Absolute 0.0 0.0 - 0.1 K/uL  I-stat troponin, ED (not at [facility])     Status: None   Collection Time: 08/17/14  2:20 PM  Result Value Ref Range   Troponin i, poc 0.00 0.00 - 0.08 ng/mL   Comment 3  Comment: Due to the release kinetics of cTnI, a negative result within the first hours of the onset of symptoms does not rule out myocardial infarction with certainty. If myocardial infarction is still suspected, repeat the test at appropriate intervals.   Urinalysis, Routine w reflex microscopic     Status: Abnormal   Collection Time: 08/17/14  3:20 PM  Result Value Ref Range   Color, Urine YELLOW YELLOW   APPearance CLEAR CLEAR   Specific Gravity, Urine 1.011 1.005 - 1.030   pH 7.0 5.0 - 8.0   Glucose, UA NEGATIVE NEGATIVE mg/dL   Hgb urine dipstick LARGE (A) NEGATIVE   Bilirubin Urine NEGATIVE NEGATIVE   Ketones, ur NEGATIVE NEGATIVE mg/dL   Protein, ur NEGATIVE NEGATIVE mg/dL   Urobilinogen, UA 0.2 0.0 - 1.0 mg/dL   Nitrite NEGATIVE NEGATIVE   Leukocytes, UA NEGATIVE NEGATIVE  Urine microscopic-add on     Status: None   Collection Time: 08/17/14  3:20 PM  Result Value Ref Range   Squamous Epithelial / LPF RARE RARE   WBC, UA 0-2 <3 WBC/hpf   RBC / HPF 21-50 <3 RBC/hpf   Bacteria, UA RARE RARE    Radiology Reports: Dg Chest 2 View  08/17/2014   CLINICAL DATA:  Dizziness, no chest complaints, shortness of breath or cough at times. Diaphoresis.  EXAM: CHEST  2 VIEW  COMPARISON:  None.  FINDINGS: 8 mm nodular opacity projecting over the left lower lung. There is no focal parenchymal opacity, pleural effusion, or pneumothorax. The heart and mediastinal contours are unremarkable.  The  osseous structures are unremarkable.  IMPRESSION: No active cardiopulmonary disease.  8 mm nodular opacity projecting over the left lower lung. Recommend further evaluation with a nonemergent CT of the chest.   Electronically Signed   By: Kathreen Devoid   On: 08/17/2014 15:13    Electrocardiogram: Sinus bradycardia at 49 bpm. First degree AV block. Normal axis. No concerning ST or T-wave changes are noted.  Problem List  Principal Problem:   Near syncope Active Problems:   Diaphoresis   Sinus bradycardia   Dementia   Assessment: This is a 80 year old Caucasian male with a past medical history of dementia who presents with what appears to be a possible near syncopal episode. Exact circumstances of this event is not known due to his dementia. There are reports that he was eating lunch when this happened, but the patient denies eating at the lunch table at that time. And he said that he was actually sitting down elsewhere when this occurred. Orthostatic hypotension cannot be ruled out either. He also reports flashing lights, significance of which is unclear. He does not have any focal neurological deficits at this time. He is noted to be bradycardic. Bradycardia could have accounted for his symptoms. He is on Aricept, which can cause AV block and bradycardia.  Plan: #1 Near syncope: Etiology unclear. Will check orthostatics. Check echocardiogram. Due to these vague symptoms of flashing lights we'll also get a CT head. We'll monitor him on telemetry due to bradycardia. Have him evaluated by PT and OT. Unlikely this is ACS. Check Troponins.  #2 Sinus bradycardia: Blood pressure is stable currently. Neither he nor his wife know if his heart rate typically runs slow. I don't have any previous notes in our electronic health record with the mention of his heart rate in the past. Aricept is known to cause bradycardia. We will hold this medication for now. We will check TSH. Echocardiogram may provide some  input as well.  #3 history of dementia: Continue just with Namenda. Hold his Aricept.  #4 Microscopic hematuria: According to his wife this has been evaluated by his urologist. He does have a history of prostate cancer and is followed closely. Do not anticipate any further workup while he is hospitalized  #5 Left Pulmonary Nodule: Incidentally noted on chest x-ray. Considering his dementia will defer further management to his PCP.  #6 Weight Loss: Nutritionist to see. Defer further management to PCP.  DVT Prophylaxis: Lovenox Code Status: DO NOT RESUSCITATE Family Communication: Discussed with the patient and his wife  Disposition Plan: Observe to telemetry   Further management decisions will depend on results of further testing and patient's response to treatment.   [facility]  [facility] Pager [phone]  If 7PM-7AM, please contact night-coverage [email or web address] Password [facility]  08/17/2014, 4:43 PM

## 2014-08-18 DIAGNOSIS — F0390 Unspecified dementia without behavioral disturbance: Secondary | ICD-10-CM | POA: Diagnosis not present

## 2014-08-18 DIAGNOSIS — E43 Unspecified severe protein-calorie malnutrition: Secondary | ICD-10-CM | POA: Diagnosis not present

## 2014-08-18 DIAGNOSIS — R55 Syncope and collapse: Secondary | ICD-10-CM | POA: Diagnosis not present

## 2014-08-18 DIAGNOSIS — I359 Nonrheumatic aortic valve disorder, unspecified: Secondary | ICD-10-CM | POA: Diagnosis not present

## 2014-08-18 DIAGNOSIS — R001 Bradycardia, unspecified: Secondary | ICD-10-CM | POA: Diagnosis not present

## 2014-08-18 LAB — COMPREHENSIVE METABOLIC PANEL
ALBUMIN: 3.1 g/dL — AB (ref 3.5–5.2)
ALK PHOS: 57 U/L (ref 39–117)
ALT: 15 U/L (ref 0–53)
AST: 19 U/L (ref 0–37)
Anion gap: 9 (ref 5–15)
BILIRUBIN TOTAL: 0.4 mg/dL (ref 0.3–1.2)
BUN: 13 mg/dL (ref 6–23)
CO2: 25 mEq/L (ref 19–32)
CREATININE: 0.81 mg/dL (ref 0.50–1.35)
Calcium: 8.8 mg/dL (ref 8.4–10.5)
Chloride: 107 mEq/L (ref 96–112)
GFR calc Af Amer: >90 mL/min (ref >90)
GFR calc non Af Amer: 78 mL/min — ABNORMAL LOW (ref >90)
Glucose, Bld: 91 mg/dL (ref 70–99)
POTASSIUM: 4.3 meq/L (ref 3.7–5.3)
SODIUM: 141 meq/L (ref 137–147)
Total Protein: 5.8 g/dL — ABNORMAL LOW (ref 6.0–8.3)

## 2014-08-18 LAB — TROPONIN I
Troponin I: <0.3 ng/mL (ref <0.30)
Troponin I: <0.3 ng/mL (ref <0.30)

## 2014-08-18 LAB — CBC
HEMATOCRIT: 42.6 % (ref 39.0–52.0)
Hemoglobin: 14.2 g/dL (ref 13.0–17.0)
MCH: 30.5 pg (ref 26.0–34.0)
MCHC: 33.3 g/dL (ref 30.0–36.0)
MCV: 91.6 fL (ref 78.0–100.0)
Platelets: 153 10*3/uL (ref 150–400)
RBC: 4.65 MIL/uL (ref 4.22–5.81)
RDW: 13.1 % (ref 11.5–15.5)
WBC: 5.3 10*3/uL (ref 4.0–10.5)

## 2014-08-18 MED ORDER — BOOST PLUS PO LIQD
237.0000 mL | Freq: Two times a day (BID) | ORAL | Status: DC
  Administered 2014-08-18 – 2014-08-19 (×3): 237 mL via ORAL
  Filled 2014-08-18 (×6): qty 237

## 2014-08-18 NOTE — Progress Notes (Signed)
UR completed 

## 2014-08-18 NOTE — Evaluation (Signed)
Physical Therapy Evaluation Patient Details Name: [name] MRN: [id number] DOB: 1926/03/07 Today's Date: 08/18/2014   History of Present Illness  80 y.o. male with history of dementia admitted with near syncopal episode, and sinus bradycardia.   Clinical Impression  Pt admitted with the above. Pt currently with functional limitations due to the deficits listed below (see PT Problem List). Ambulates up to 300 feet today without an assistive device, requiring min assist for loss of balance on occasion; will greatly benefit from cane use. Reported feeling "wobbly" while ambulating; BP 123/87 HR 78 in standing after ambulating (orthostatics available in vitals tab). Mod I with bed mobility and transfers. Pt will benefit from skilled PT to increase their independence and safety with mobility to allow discharge to the venue listed below.       Follow Up Recommendations Home health PT;Supervision/Assistance - 24 hour    Equipment Recommendations  Cane    Recommendations for Other Services       Precautions / Restrictions Precautions Precautions: Fall Precaution Comments: near syncope Restrictions Weight Bearing Restrictions: No      Mobility  Bed Mobility Overal bed mobility: Modified Independent                Transfers Overall transfer level: Modified independent                  Ambulation/Gait Ambulation/Gait assistance: Min assist Ambulation Distance (Feet): 300 Feet Assistive device: None Gait Pattern/deviations: Step-through pattern;Decreased stride length;Scissoring;Staggering left;Staggering right;Drifts right/left;Narrow base of support Gait velocity: decreased   General Gait Details: Required min assist intermittently for loss of balance to right and left. States he does feel "wobbly." VC for forward gaze. Cues to keep wide base of support to prevent scissoring at times.  Stairs            Wheelchair Mobility    Modified Rankin (Stroke  Patients Only)       Balance Overall balance assessment: Needs assistance Sitting-balance support: No upper extremity supported;Feet supported Sitting balance-Leahy Scale: Good     Standing balance support: No upper extremity supported Standing balance-Leahy Scale: Fair                               Pertinent Vitals/Pain Pain Assessment: No/denies pain    Home Living Family/patient expects to be discharged to:: Private residence Living Arrangements: Spouse/significant other Available Help at Discharge: Family;Available 24 hours/day Type of Home: House Home Access: Level entry     Home Layout: One level Home Equipment: Other (comment) (possibly has shower seat but unsure)      Prior Function Level of Independence: Independent               Hand Dominance   Dominant Hand: Right    Extremity/Trunk Assessment   Upper Extremity Assessment: Defer to OT evaluation           Lower Extremity Assessment: Overall WFL for tasks assessed         Communication   Communication: No difficulties  Cognition Arousal/Alertness: Awake/alert Behavior During Therapy: WFL for tasks assessed/performed Overall Cognitive Status: History of cognitive impairments - at baseline                      General Comments General comments (skin integrity, edema, etc.): normal heel to shin and finger to nose test.    Exercises        Assessment/Plan  PT Assessment Patient needs continued PT services  PT Diagnosis Difficulty walking;Abnormality of gait   PT Problem List Decreased activity tolerance;Decreased balance;Decreased mobility;Decreased knowledge of use of DME  PT Treatment Interventions DME instruction;Gait training;Functional mobility training;Therapeutic activities;Therapeutic exercise;Balance training;Neuromuscular re-education;Patient/family education   PT Goals (Current goals can be found in the Care Plan section) Acute Rehab PT  Goals Patient Stated Goal: none stated PT Goal Formulation: With patient Time For Goal Achievement: 09/01/14 Potential to Achieve Goals: Good    Frequency Min 3X/week   Barriers to discharge        Co-evaluation               End of Session Equipment Utilized During Treatment: Gait belt Activity Tolerance: Patient tolerated treatment well Patient left: in chair;with call bell/phone within reach Nurse Communication: Mobility status    Functional Assessment Tool Used: clinical observation Functional Limitation: Mobility: Walking and moving around Mobility: Walking and Moving Around Current Status [phone]): At least 1 percent but less than 20 percent impaired, limited or restricted Mobility: Walking and Moving Around Goal Status [phone]): 0 percent impaired, limited or restricted    Time: 1013-1045 PT Time Calculation (min) (ACUTE ONLY): 32 min   Charges:   PT Evaluation $Initial PT Evaluation Tier I: 1 Procedure PT Treatments $Gait Training: 8-22 mins   PT G Codes:   Functional Assessment Tool Used: clinical observation Functional Limitation: Mobility: Walking and moving around    Ellouise Newer 08/18/2014, 11:15 AM Elayne Snare, [place]

## 2014-08-18 NOTE — Progress Notes (Signed)
INITIAL NUTRITION ASSESSMENT  Pt meets criteria for SEVERE MALNUTRITION in the context of chronic illness as evidenced by severe fat and muscle mass loss.  DOCUMENTATION CODES Per approved criteria  -Severe malnutrition in the context of chronic illness   INTERVENTION: Provide Boost Plus po BID, each supplement provides 360 kcal and 14 grams of protein.  Encourage adequate PO intake.  NUTRITION DIAGNOSIS: Malnutrition related to chronic illness as evidenced by severe fat and muscle mass loss.   Goal: Pt to meet >/= 90% of their estimated nutrition needs   Monitor:  PO intake, weight trends, labs, I/O's  Reason for Assessment: MD consult  80 y.o. male  Admitting Dx: Near syncope  ASSESSMENT: Pt with a past history prostate cancer, dementia. He was in this facility this morning when he was sitting and then he felt as if he was having a hot flash. Felt as if he was going to pass out. He and his wife mentioned that he's lost about 6-7 pounds over the last few months.  Pt reports his appetite is good currently and PTA at home eating 3 meals a day. Diet recall includes cereal and milk for breakfast and a meal consisting of protein, starch, and vegetable at both lunch and dinner. Current meal completion this AM was 75%. Pt does report weight loss with his usual body weight of 157 lbs where he reports last weighing around 2 months ago. He reports the weight loss is from having difficulties swallowing (esophageal stricture) with associated vomiting. Pt had currently underwent EDG esophageal dilation in November and reports he has no difficulties now. Pt drinks Boost at home once daily and would like some ordered. RD to order. Pt was educated to continue oral supplement drinks at home to aid in calorie and protein needs to further prevent weight loss. Pt encouraged to eat his food at meals.  Nutrition Focused Physical Exam:  Subcutaneous Fat:  Orbital Region: [name] Upper Arm Region: Severe  depletion Thoracic and Lumbar Region: Severe depletion  Muscle:  Temple Region: [name] Clavicle Bone Region: Severe depletion Clavicle and Acromion Bone Region: Severe depletion Scapular Bone Region: [name] Dorsal Hand: [name] Patellar Region: WNL Anterior Thigh Region: WNL Posterior Calf Region: Moderate depletion  Edema: none   Height: Ht Readings from Last 1 Encounters:  07/03/14 5\' 8"  (1.727 m)    Weight: Wt Readings from Last 1 Encounters:  07/03/14 147 lb (66.679 kg)  Pt reports he currently weight ~142 lbs.   Ideal Body Weight: 154 lbs  % Ideal Body Weight: 95%  Wt Readings from Last 10 Encounters:  07/03/14 147 lb (66.679 kg)    Usual Body Weight: 157 lbs (pt reports 2 months ago)  % Usual Body Weight: 94% (6.3% weight loss in 2 months)  BMI:  Body Mass Index: 22.57 kg/(m^2)  Estimated Nutritional Needs: Kcal: 1850-2050 Protein: 80-90 grams Fluid: 1.85 - 2.05 L/day  Skin: intact  Diet Order: Diet regular  EDUCATION NEEDS: -Education needs addressed   Intake/Output Summary (Last 24 hours) at 08/18/14 0913 Last data filed at 08/18/14 0700  Gross per 24 hour  Intake    120 ml  Output      0 ml  Net    120 ml    Last BM: 12/16  Labs:   Recent Labs Lab 08/17/14 1327 08/18/14 0528  NA 142 141  K 4.0 4.3  CL 104 107  CO2 28 25  BUN 12 13  CREATININE 0.71 0.81  CALCIUM 9.6 8.8  GLUCOSE 94 91    CBG (last 3)  No results for input(s): GLUCAP in the last 72 hours.  Scheduled Meds: . enoxaparin (LOVENOX) injection  40 mg Subcutaneous Q24H  . memantine  10 mg Oral BID  . pantoprazole  40 mg Oral Daily  . simvastatin  40 mg Oral q1800  . sodium chloride  3 mL Intravenous Q12H  . sodium chloride  3 mL Intravenous Q12H  . cyanocobalamin  100 mcg Oral Daily    Continuous Infusions:   Past Medical History  Diagnosis Date  . GERD (gastroesophageal reflux disease)   . Heart murmur     systolic murmur no further eval d/t Alzheimers dx  .  Stroke     TIA  . Dementia     Alzheimers  . Cancer     Prostate    Past Surgical History  Procedure Laterality Date  . Esophagogastroduodenoscopy [name] 07/03/2014    Procedure: ESOPHAGOGASTRODUODENOSCOPY (EGD);  Surgeon: Garlan Fair, MD;  Location: [facility];  Service: Endoscopy;  Laterality: [name];  . Balloon dilation [name] 07/03/2014    Procedure: BALLOON DILATION;  Surgeon: Garlan Fair, MD;  Location: [facility];  Service: Endoscopy;  Laterality: [name];    Kallie Locks, MS, RD, LDN Pager # [phone] After hours/ weekend pager # [phone]

## 2014-08-18 NOTE — Progress Notes (Signed)
PROGRESS NOTE  [name] WJX:[id number] DOB: [birth year] DOA: 08/17/2014 PCP: Mathews Argyle, MD  Assessment/Plan: Near syncope: Etiology unclear.  -orthostatics. -echocardiogram. -CT head ok.  -bradycardia improved.  PT/OT eval.  Sinus bradycardia: Blood pressure is stable currently. Neither he nor his wife know if his heart rate typically runs slow. I don't have any previous notes in our electronic health record with the mention of his heart rate in the past. Aricept is known to cause bradycardia. We will hold this medication for now.  TSH ok - Echocardiogram may provide some input as well.  history of dementia: Continue just with Namenda. Hold his Aricept.  Microscopic hematuria: According to his wife this has been evaluated by his urologist. He does have a history of prostate cancer and is followed closely. Do not anticipate any further workup while he is hospitalized  Left Pulmonary Nodule: Incidentally noted on chest x-ray. Considering his dementia will defer further management to his PCP.  Weight Loss with severe malnutrition:  Nutritionist to see. Defer further management to PCP.  Code Status: DNR Family Communication: called wife- [phone]- no answer Disposition Plan:    Consultants:     HPI/Subjective: Patient was a little unsteady when he got up with PT per patient No further "hot flashes"  Objective: Filed Vitals:   08/18/14 0703  BP: 100/55  Pulse: 61  Temp: 98.2 F (36.8 C)  Resp: 18    Intake/Output Summary (Last 24 hours) at 08/18/14 1012 Last data filed at 08/18/14 0700  Gross per 24 hour  Intake    120 ml  Output      0 ml  Net    120 ml   There were no vitals filed for this visit.  Exam:   General:  Pleasant/cooperative  Cardiovascular: rrr  Respiratory: clear  Abdomen: +BS, soft  Musculoskeletal: no edema   Data Reviewed: Basic Metabolic Panel:  Recent Labs Lab 08/17/14 1327 08/18/14 0528  NA 142 141  K 4.0  4.3  CL 104 107  CO2 28 25  GLUCOSE 94 91  BUN 12 13  CREATININE 0.71 0.81  CALCIUM 9.6 8.8   Liver Function Tests:  Recent Labs Lab 08/18/14 0528  AST 19  ALT 15  ALKPHOS 57  BILITOT 0.4  PROT 5.8*  ALBUMIN 3.1*   No results for input(s): LIPASE, AMYLASE in the last 168 hours. No results for input(s): AMMONIA in the last 168 hours. CBC:  Recent Labs Lab 08/17/14 1327 08/18/14 0528  WBC 6.5 5.3  NEUTROABS 4.8  --   HGB 15.9 14.2  HCT 47.7 42.6  MCV 90.7 91.6  PLT 161 153   Cardiac Enzymes:  Recent Labs Lab 08/17/14 1851 08/17/14 2325 08/18/14 0528  TROPONINI <0.30 <0.30 <0.30   BNP (last 3 results) No results for input(s): PROBNP in the last 8760 hours. CBG: No results for input(s): GLUCAP in the last 168 hours.  No results found for this or any previous visit (from the past 240 hour(s)).   Studies: Dg Chest 2 View  08/17/2014   CLINICAL DATA:  Dizziness, no chest complaints, shortness of breath or cough at times. Diaphoresis.  EXAM: CHEST  2 VIEW  COMPARISON:  None.  FINDINGS: 8 mm nodular opacity projecting over the left lower lung. There is no focal parenchymal opacity, pleural effusion, or pneumothorax. The heart and mediastinal contours are unremarkable.  The osseous structures are unremarkable.  IMPRESSION: No active cardiopulmonary disease.  8 mm nodular opacity projecting over the left  lower lung. Recommend further evaluation with a nonemergent CT of the chest.   Electronically Signed   By: Kathreen Devoid   On: 08/17/2014 15:13   Ct Head Wo Contrast  08/17/2014   CLINICAL DATA:  Single episode of near-syncope around noon today. Initial encounter.  EXAM: CT HEAD WITHOUT CONTRAST  TECHNIQUE: Contiguous axial images were obtained from the base of the skull through the vertex without intravenous contrast.  COMPARISON:  None.  FINDINGS: There is no evidence of acute infarction, mass lesion, or intra- or extra-axial hemorrhage on CT.  Prominence of the  ventricles and sulci reflects moderate cortical volume loss. Diffuse periventricular and subcortical white matter change likely reflects small vessel ischemic microangiopathy cerebellar atrophy is noted.  The brainstem and fourth ventricle are within normal limits. The basal ganglia are unremarkable in appearance. The cerebral hemispheres demonstrate grossly normal gray-white differentiation. No mass effect or midline shift is seen.  There is no evidence of fracture; visualized osseous structures are unremarkable in appearance. The orbits are within normal limits. The paranasal sinuses and mastoid air cells are well-aerated. No significant soft tissue abnormalities are seen.  IMPRESSION: 1. No acute intracranial pathology seen on CT. 2. Moderate cortical volume loss and diffuse small vessel ischemic microangiopathy.   Electronically Signed   By: Garald Balding M.D.   On: 08/17/2014 21:15    Scheduled Meds: . enoxaparin (LOVENOX) injection  40 mg Subcutaneous Q24H  . memantine  10 mg Oral BID  . pantoprazole  40 mg Oral Daily  . simvastatin  40 mg Oral q1800  . sodium chloride  3 mL Intravenous Q12H  . sodium chloride  3 mL Intravenous Q12H  . cyanocobalamin  100 mcg Oral Daily   Continuous Infusions:  Antibiotics Given (last 72 hours)    None      Principal Problem:   Near syncope Active Problems:   Diaphoresis   Sinus bradycardia   Dementia   Pulmonary nodule    Time spent: 35 min    VANN, [facility] Pager [phone]. If 7PM-7AM, please contact night-coverage at [email or web address], password [facility] 08/18/2014, 10:12 AM  LOS: 1 day

## 2014-08-18 NOTE — Progress Notes (Signed)
  Echocardiogram 2D Echocardiogram has been performed.  [name] 08/18/2014, 1:24 PM

## 2014-08-19 DIAGNOSIS — E43 Unspecified severe protein-calorie malnutrition: Secondary | ICD-10-CM | POA: Diagnosis not present

## 2014-08-19 DIAGNOSIS — R55 Syncope and collapse: Secondary | ICD-10-CM | POA: Diagnosis not present

## 2014-08-19 DIAGNOSIS — F0390 Unspecified dementia without behavioral disturbance: Secondary | ICD-10-CM | POA: Diagnosis not present

## 2014-08-19 DIAGNOSIS — R911 Solitary pulmonary nodule: Secondary | ICD-10-CM | POA: Diagnosis not present

## 2014-08-19 NOTE — Progress Notes (Signed)
Utilization Review Completed.   Anyjah Roundtree, RN, BSN Nurse Case Manager  

## 2014-08-19 NOTE — Discharge Summary (Signed)
Physician Discharge Summary  [name] HTD:[id number] DOB: Jul 20, 1926 DOA: 08/17/2014  PCP: Mathews Argyle, MD  Admit date: 08/17/2014 Discharge date: 08/19/2014  Time spent: 35 minutes  Recommendations for Outpatient Follow-up:  1. D/c'd aricept due to bradycardia Home health PT 24 hour assistance Left pulm nodule-please follow  Discharge Diagnoses:  Principal Problem:   Near syncope Active Problems:   Diaphoresis   Sinus bradycardia   Dementia   Pulmonary nodule   Protein-calorie malnutrition, severe   Discharge Condition: improved  Diet recommendation: regular  There were no vitals filed for this visit.  History of present illness:  [name] is a 80 y.o. male with a past history of for dementia, who lives with his wife. And, who goes to an adult care facility for exercises and social activities. He was in this facility this morning when he was sitting and then he felt as if he was having a hot flash. He felt diaphoretic. He was sweating. He got up. Didn't feel any better. Felt dizzy. Felt as if he was going to pass out. He sat back down. Denies any chest pain or shortness of breath. No focal weakness. History is limited due to his dementia. He also mentioned that he at times he saw flashing lights. Denies any other visual disturbances. No headaches. No nausea, vomiting. No discomfort with urination. He does have a history of hematuria for which he has been seen by urology in the past. He says he did not miss his breakfast this morning. Denies any fever or chills. No recent travel. No sick contacts. No palpitations. Denies having had this kind of problem before. He is accompanied by his wife. He and his wife mentioned that he's lost about 6-7 pounds over the last few months  Hospital Course:  Near syncope: Etiology unclear.  -orthostatics ok -patient feels better -echocardiogram as below -CT head ok.  -bradycardia improved off aricept PT/OT eval- home  health PT- 24 hour supervision  Sinus bradycardia:   Aricept is known to cause bradycardia. We will hold this medication for now.  TSH ok - Echocardiogram below  history of dementia: Continue just with Namenda. Hold his Aricept.  Microscopic hematuria: According to his wife this has been evaluated by his urologist. He does have a history of prostate cancer and is followed closely.   Left Pulmonary Nodule: Incidentally noted on chest x-ray. Considering his dementia will defer further management to his PCP.  Weight Loss with severe malnutrition: Nutritionist to see. Defer further management to PCP  Procedures:  Echo: Study Conclusions  - Left ventricle: The cavity size was normal. Wall thickness was increased in a pattern of mild LVH. Systolic function was vigorous. The estimated ejection fraction was in the range of 65% to 70%. Wall motion was normal; there were no regional wall motion abnormalities. Doppler parameters are consistent with abnormal left ventricular relaxation (grade 1 diastolic dysfunction). The E/e&' ratio is between 8-15, suggesting indeterminate LV filling pressure. - Aortic valve: Mildly calcified leaflets. Peak and mean gradients of 34 mmHg and 19 mmHg. The calculated AVA is 1.8 cm2, consistent with mild aortic stenosis, based on an LVOT diameter of 2.5 cm. There was trivial regurgitation. Valve area (VTI): 2.12 cm^2. Valve area (Vmax): 1.76 cm^2. Valve area (Vmean): 1.83 cm^2. - Mitral valve: Calcified annulus. Mildly thickened leaflets . There was mild regurgitation. - Left atrium: The atrium was at the upper limits of normal in size.  Impressions:  - LVEF 65-70%, mild LVH, diastolic dysfunction,  indeterminate filling pressure, mild aortic stenosis AVA 1.8 cm2.  Consultations:  none  Discharge Exam: Filed Vitals:   08/18/14 2100  BP: 126/64  Pulse: 51  Temp: 98 F (36.7 C)  Resp: 18    General:  pleasant/cooperative- called wife, NA Cardiovascular: rrr Respiratory: clear  Discharge Instructions You were cared for by a hospitalist during your hospital stay. If you have any questions about your discharge medications or the care you received while you were in the hospital after you are discharged, you can call the unit and asked to speak with the hospitalist on call if the hospitalist that took care of you is not available. Once you are discharged, your primary care physician will handle any further medical issues. Please note that NO REFILLS for any discharge medications will be authorized once you are discharged, as it is imperative that you return to your primary care physician (or establish a relationship with a primary care physician if you do not have one) for your aftercare needs so that they can reassess your need for medications and monitor your lab values.  Discharge Instructions    Diet general    Complete by:  As directed      Discharge instructions    Complete by:  As directed   Home health PT 24 hour supervision     Increase activity slowly    Complete by:  As directed           Current Discharge Medication List    CONTINUE these medications which have NOT CHANGED   Details  beta carotene w/minerals (OCUVITE) tablet Take 1 tablet by mouth daily.    co-enzyme Q-10 30 MG capsule Take 30 mg by mouth daily.    cyanocobalamin 1000 MCG tablet Take 100 mcg by mouth daily.    LOTEMAX 0.5 % GEL Place 1 drop into the left eye as needed (pain).     memantine (NAMENDA) 10 MG tablet Take 10 mg by mouth 2 (two) times daily.    omeprazole (PRILOSEC) 20 MG capsule Take 20 mg by mouth daily.    simvastatin (ZOCOR) 80 MG tablet Take 40 mg by mouth daily.       STOP taking these medications     donepezil (ARICEPT) 10 MG tablet        No Known Allergies Follow-up Information    Follow up with Mathews Argyle, MD In 1 week.   Specialty:  Internal Medicine   Contact  information:   [address] [place] [address]        The results of significant diagnostics from this hospitalization (including imaging, microbiology, ancillary and laboratory) are listed below for reference.    Significant Diagnostic Studies: Dg Chest 2 View  08/17/2014   CLINICAL DATA:  Dizziness, no chest complaints, shortness of breath or cough at times. Diaphoresis.  EXAM: CHEST  2 VIEW  COMPARISON:  None.  FINDINGS: 8 mm nodular opacity projecting over the left lower lung. There is no focal parenchymal opacity, pleural effusion, or pneumothorax. The heart and mediastinal contours are unremarkable.  The osseous structures are unremarkable.  IMPRESSION: No active cardiopulmonary disease.  8 mm nodular opacity projecting over the left lower lung. Recommend further evaluation with a nonemergent CT of the chest.   Electronically Signed   By: Kathreen Devoid   On: 08/17/2014 15:13   Ct Head Wo Contrast  08/17/2014   CLINICAL DATA:  Single episode of near-syncope around noon today. Initial encounter.  EXAM: CT HEAD WITHOUT CONTRAST  TECHNIQUE: Contiguous axial images were obtained from the base of the skull through the vertex without intravenous contrast.  COMPARISON:  None.  FINDINGS: There is no evidence of acute infarction, mass lesion, or intra- or extra-axial hemorrhage on CT.  Prominence of the ventricles and sulci reflects moderate cortical volume loss. Diffuse periventricular and subcortical white matter change likely reflects small vessel ischemic microangiopathy cerebellar atrophy is noted.  The brainstem and fourth ventricle are within normal limits. The basal ganglia are unremarkable in appearance. The cerebral hemispheres demonstrate grossly normal gray-white differentiation. No mass effect or midline shift is seen.  There is no evidence of fracture; visualized osseous structures are unremarkable in appearance. The orbits are within normal limits. The paranasal sinuses  and mastoid air cells are well-aerated. No significant soft tissue abnormalities are seen.  IMPRESSION: 1. No acute intracranial pathology seen on CT. 2. Moderate cortical volume loss and diffuse small vessel ischemic microangiopathy.   Electronically Signed   By: Garald Balding M.D.   On: 08/17/2014 21:15    Microbiology: No results found for this or any previous visit (from the past 240 hour(s)).   Labs: Basic Metabolic Panel:  Recent Labs Lab 08/17/14 1327 08/18/14 0528  NA 142 141  K 4.0 4.3  CL 104 107  CO2 28 25  GLUCOSE 94 91  BUN 12 13  CREATININE 0.71 0.81  CALCIUM 9.6 8.8   Liver Function Tests:  Recent Labs Lab 08/18/14 0528  AST 19  ALT 15  ALKPHOS 57  BILITOT 0.4  PROT 5.8*  ALBUMIN 3.1*   No results for input(s): LIPASE, AMYLASE in the last 168 hours. No results for input(s): AMMONIA in the last 168 hours. CBC:  Recent Labs Lab 08/17/14 1327 08/18/14 0528  WBC 6.5 5.3  NEUTROABS 4.8  --   HGB 15.9 14.2  HCT 47.7 42.6  MCV 90.7 91.6  PLT 161 153   Cardiac Enzymes:  Recent Labs Lab 08/17/14 1851 08/17/14 2325 08/18/14 0528  TROPONINI <0.30 <0.30 <0.30   BNP: BNP (last 3 results) No results for input(s): PROBNP in the last 8760 hours. CBG: No results for input(s): GLUCAP in the last 168 hours.     SignedEulogio Bear  [facility] 08/19/2014, 10:21 AM

## 2014-08-19 NOTE — Care Management Note (Addendum)
  Page 2 of 2   08/19/2014     12:08:54 PM CARE MANAGEMENT NOTE 08/19/2014  Patient:  [name], [name]   Account Number:  [id number]  Date Initiated:  08/19/2014  Documentation initiated by:  [email or web address],PVXY  Subjective/Objective Assessment:   Near syncope     Action/Plan:   Discharge Planning   Anticipated DC Date:  08/19/2014   Anticipated DC Plan:  [place]  CM consult      [facility] Choice  HOME HEALTH   Choice offered to / List presented to:  C-3 Spouse   DME arranged  [facility]      DME agency  [place] arranged  [place].   Status of service:  Completed, signed off Medicare Important Message given?   (If response is "NO", the following Medicare IM given date fields will be blank) Date Medicare IM given:   Medicare IM given by:   Date Additional Medicare IM given:   Additional Medicare IM given by:    Discharge Disposition:  [place]  Per UR Regulation:    If discussed at Long Length of Stay Meetings, dates discussed:    Comments:  Jamse Arn RN  BSN 11.10AM   Met patient at bedside.[employer]pgraph[employer] verified in EPIC.Patient to be discharged home with a cane / PT services.   He resides with his wife at home.Choice list provided to patient so Home health PT can be set up for DME needs.Physical  Therapy recommend a cane and PT services at home.Patient provided his verabl consent for this CM to call his wife to  Discuss patients discharge plan.CM spoke with patients wife and clarified the discharge plan.Patient and wife elect [facility]   To provide DME/ Physical therapy services.CM contacted [facility] rep to place DME referral .Kasandra Knudsen will be delivered  To patients room prior to patients discharge home.CM spoke with [facility] regarding Physical therapy order -DME  No further CM needs at this time.  Jamse Arn RN/ BSN [phone].

## 2014-08-20 DIAGNOSIS — R911 Solitary pulmonary nodule: Secondary | ICD-10-CM | POA: Diagnosis not present

## 2014-08-20 DIAGNOSIS — R001 Bradycardia, unspecified: Secondary | ICD-10-CM | POA: Diagnosis not present

## 2014-08-20 NOTE — Progress Notes (Signed)
Assessment unchanged. Discussed D/C instructions with pt including f/u appointments and new medications. Verbalized understanding. RX given to pt. IV and tele removed. Pt left with belongings accompanied by RN.

## 2014-08-22 DIAGNOSIS — R634 Abnormal weight loss: Secondary | ICD-10-CM | POA: Diagnosis not present

## 2014-08-22 DIAGNOSIS — R911 Solitary pulmonary nodule: Secondary | ICD-10-CM | POA: Diagnosis not present

## 2014-08-22 DIAGNOSIS — E43 Unspecified severe protein-calorie malnutrition: Secondary | ICD-10-CM | POA: Diagnosis not present

## 2014-08-22 DIAGNOSIS — F0391 Unspecified dementia with behavioral disturbance: Secondary | ICD-10-CM | POA: Diagnosis not present

## 2014-08-22 DIAGNOSIS — T506X5S Adverse effect of antidotes and chelating agents, sequela: Secondary | ICD-10-CM | POA: Diagnosis not present

## 2014-08-28 DIAGNOSIS — R319 Hematuria, unspecified: Secondary | ICD-10-CM | POA: Diagnosis not present

## 2014-08-28 DIAGNOSIS — R310 Gross hematuria: Secondary | ICD-10-CM | POA: Diagnosis not present

## 2014-08-28 DIAGNOSIS — K449 Diaphragmatic hernia without obstruction or gangrene: Secondary | ICD-10-CM | POA: Diagnosis not present

## 2014-08-30 DIAGNOSIS — R911 Solitary pulmonary nodule: Secondary | ICD-10-CM | POA: Diagnosis not present

## 2014-08-30 DIAGNOSIS — F0391 Unspecified dementia with behavioral disturbance: Secondary | ICD-10-CM | POA: Diagnosis not present

## 2014-08-30 DIAGNOSIS — T506X5S Adverse effect of antidotes and chelating agents, sequela: Secondary | ICD-10-CM | POA: Diagnosis not present

## 2014-08-30 DIAGNOSIS — R634 Abnormal weight loss: Secondary | ICD-10-CM | POA: Diagnosis not present

## 2014-08-30 DIAGNOSIS — E43 Unspecified severe protein-calorie malnutrition: Secondary | ICD-10-CM | POA: Diagnosis not present

## 2014-08-31 DIAGNOSIS — E43 Unspecified severe protein-calorie malnutrition: Secondary | ICD-10-CM | POA: Diagnosis not present

## 2014-08-31 DIAGNOSIS — R911 Solitary pulmonary nodule: Secondary | ICD-10-CM | POA: Diagnosis not present

## 2014-08-31 DIAGNOSIS — T506X5S Adverse effect of antidotes and chelating agents, sequela: Secondary | ICD-10-CM | POA: Diagnosis not present

## 2014-08-31 DIAGNOSIS — F0391 Unspecified dementia with behavioral disturbance: Secondary | ICD-10-CM | POA: Diagnosis not present

## 2014-08-31 DIAGNOSIS — R634 Abnormal weight loss: Secondary | ICD-10-CM | POA: Diagnosis not present

## 2014-09-05 DIAGNOSIS — E43 Unspecified severe protein-calorie malnutrition: Secondary | ICD-10-CM | POA: Diagnosis not present

## 2014-09-05 DIAGNOSIS — R911 Solitary pulmonary nodule: Secondary | ICD-10-CM | POA: Diagnosis not present

## 2014-09-05 DIAGNOSIS — T506X5S Adverse effect of antidotes and chelating agents, sequela: Secondary | ICD-10-CM | POA: Diagnosis not present

## 2014-09-05 DIAGNOSIS — F0391 Unspecified dementia with behavioral disturbance: Secondary | ICD-10-CM | POA: Diagnosis not present

## 2014-09-05 DIAGNOSIS — R634 Abnormal weight loss: Secondary | ICD-10-CM | POA: Diagnosis not present

## 2014-09-13 DIAGNOSIS — R911 Solitary pulmonary nodule: Secondary | ICD-10-CM | POA: Diagnosis not present

## 2014-09-13 DIAGNOSIS — T506X5S Adverse effect of antidotes and chelating agents, sequela: Secondary | ICD-10-CM | POA: Diagnosis not present

## 2014-09-13 DIAGNOSIS — R634 Abnormal weight loss: Secondary | ICD-10-CM | POA: Diagnosis not present

## 2014-09-13 DIAGNOSIS — E43 Unspecified severe protein-calorie malnutrition: Secondary | ICD-10-CM | POA: Diagnosis not present

## 2014-09-13 DIAGNOSIS — F0391 Unspecified dementia with behavioral disturbance: Secondary | ICD-10-CM | POA: Diagnosis not present

## 2014-09-20 DIAGNOSIS — C61 Malignant neoplasm of prostate: Secondary | ICD-10-CM | POA: Diagnosis not present

## 2014-09-20 DIAGNOSIS — N200 Calculus of kidney: Secondary | ICD-10-CM | POA: Diagnosis not present

## 2014-09-20 DIAGNOSIS — R310 Gross hematuria: Secondary | ICD-10-CM | POA: Diagnosis not present

## 2014-10-18 DIAGNOSIS — H04123 Dry eye syndrome of bilateral lacrimal glands: Secondary | ICD-10-CM | POA: Diagnosis not present

## 2014-10-18 DIAGNOSIS — H01001 Unspecified blepharitis right upper eyelid: Secondary | ICD-10-CM | POA: Diagnosis not present

## 2014-10-18 DIAGNOSIS — H01004 Unspecified blepharitis left upper eyelid: Secondary | ICD-10-CM | POA: Diagnosis not present

## 2014-10-18 DIAGNOSIS — H02052 Trichiasis without entropian right lower eyelid: Secondary | ICD-10-CM | POA: Diagnosis not present

## 2014-10-20 ENCOUNTER — Emergency Department (HOSPITAL_COMMUNITY)
Admission: EM | Admit: 2014-10-20 | Discharge: 2014-10-20 | Disposition: A | Discharge status: Home / Self Care | Payer: Medicare Other | Attending: Emergency Medicine | Admitting: Emergency Medicine

## 2014-10-20 ENCOUNTER — Encounter (HOSPITAL_COMMUNITY): Payer: Self-pay | Admitting: Emergency Medicine

## 2014-10-20 DIAGNOSIS — G309 Alzheimer's disease, unspecified: Secondary | ICD-10-CM | POA: Diagnosis not present

## 2014-10-20 DIAGNOSIS — R319 Hematuria, unspecified: Secondary | ICD-10-CM | POA: Diagnosis present

## 2014-10-20 DIAGNOSIS — Z79899 Other long term (current) drug therapy: Secondary | ICD-10-CM | POA: Diagnosis not present

## 2014-10-20 DIAGNOSIS — R011 Cardiac murmur, unspecified: Secondary | ICD-10-CM | POA: Insufficient documentation

## 2014-10-20 DIAGNOSIS — K219 Gastro-esophageal reflux disease without esophagitis: Secondary | ICD-10-CM | POA: Insufficient documentation

## 2014-10-20 DIAGNOSIS — N3091 Cystitis, unspecified with hematuria: Secondary | ICD-10-CM | POA: Diagnosis not present

## 2014-10-20 DIAGNOSIS — F0280 Dementia in other diseases classified elsewhere without behavioral disturbance: Secondary | ICD-10-CM | POA: Insufficient documentation

## 2014-10-20 DIAGNOSIS — Z8546 Personal history of malignant neoplasm of prostate: Secondary | ICD-10-CM | POA: Insufficient documentation

## 2014-10-20 LAB — URINALYSIS, ROUTINE W REFLEX MICROSCOPIC
Bilirubin Urine: NEGATIVE
GLUCOSE, UA: NEGATIVE mg/dL
Ketones, ur: NEGATIVE mg/dL
NITRITE: NEGATIVE
SPECIFIC GRAVITY, URINE: 1.025 (ref 1.005–1.030)
Urobilinogen, UA: 1 mg/dL (ref 0.0–1.0)
pH: 6.5 (ref 5.0–8.0)

## 2014-10-20 LAB — URINE MICROSCOPIC-ADD ON

## 2014-10-20 MED ORDER — CEPHALEXIN 500 MG PO CAPS: 500.0000 mg | ORAL_CAPSULE | Freq: Three times a day (TID) | ORAL | Status: DC

## 2014-10-20 MED ORDER — LIDOCAINE HCL 1 % IJ SOLN
INTRAMUSCULAR | Status: AC
  Administered 2014-10-20: 2.1 mL
  Filled 2014-10-20: qty 20

## 2014-10-20 MED ORDER — CEFTRIAXONE SODIUM 1 G IJ SOLR
1.0000 g | Freq: Once | INTRAMUSCULAR | Status: AC
  Administered 2014-10-20: 1 g via INTRAMUSCULAR
  Filled 2014-10-20: qty 10

## 2014-10-20 NOTE — ED Provider Notes (Signed)
CSN: [id number]     Arrival date & time 10/20/14  1241 History   First MD Initiated Contact with Patient 10/20/14 1321     Chief Complaint  Patient presents with  . Hematuria     (Consider location/radiation/quality/duration/timing/severity/associated sxs/prior Treatment) Patient is a 81 y.o. male presenting with hematuria. The history is provided by the patient and the spouse. The history is limited by the condition of the patient.  Hematuria Pertinent negatives include no chest pain, no abdominal pain, no headaches and no shortness of breath.  pt with hx dementia, hx uti/hematuria, c/o hematuria onset this morning. ?mild dysuria. No back or flank pain. Pt indicates he feels he is able to empty bladder. No abd distension. No nv. Having normal bms. No other abn bruising or bleeding. No anticoag use. No scrotal or testicular pain. Normal appetite. No nv. No recent injury, trauma or fall.      Past Medical History  Diagnosis Date  . GERD (gastroesophageal reflux disease)   . Heart murmur     systolic murmur no further eval d/t Alzheimers dx  . Stroke     TIA  . Dementia     Alzheimers  . Cancer     Prostate   Past Surgical History  Procedure Laterality Date  . Esophagogastroduodenoscopy [name] 07/03/2014    Procedure: ESOPHAGOGASTRODUODENOSCOPY (EGD);  Surgeon: Garlan Fair, MD;  Location: [facility];  Service: Endoscopy;  Laterality: [name];  . Balloon dilation [name] 07/03/2014    Procedure: BALLOON DILATION;  Surgeon: Garlan Fair, MD;  Location: [facility];  Service: Endoscopy;  Laterality: [name];   Family History  Problem Relation Age of Onset  . Heart disease Mother    History  Substance Use Topics  . Smoking status: Never Smoker   . Smokeless tobacco: Not on file  . Alcohol Use: No    Review of Systems  Constitutional: Negative for fever and chills.  HENT: Negative for nosebleeds.   Eyes: Negative for redness.  Respiratory: Negative for shortness of breath.    Cardiovascular: Negative for chest pain.  Gastrointestinal: Negative for vomiting, abdominal pain, diarrhea and blood in stool.  Endocrine: Negative for polyuria.  Genitourinary: Positive for hematuria. Negative for flank pain.  Musculoskeletal: Negative for back pain and neck pain.  Skin: Negative for rash.  Neurological: Negative for syncope, light-headedness and headaches.  Hematological: Does not bruise/bleed easily.  Psychiatric/Behavioral: Negative for confusion.      Allergies  Review of patient's allergies indicates no known allergies.  Home Medications   Prior to Admission medications   Medication Sig Start Date End Date Taking? Authorizing Provider  beta carotene w/minerals (OCUVITE) tablet Take 1 tablet by mouth every morning.    Yes Historical Provider, MD  co-enzyme Q-10 30 MG capsule Take 30 mg by mouth every morning.    Yes Historical Provider, MD  cyanocobalamin 1000 MCG tablet Take 100 mcg by mouth every morning.    Yes Historical Provider, MD  LOTEMAX 0.5 % GEL Place 1 drop into the left eye as needed (pain).  08/05/14  Yes Historical Provider, MD  memantine (NAMENDA) 10 MG tablet Take 5 mg by mouth 2 (two) times daily.    Yes Historical Provider, MD  omeprazole (PRILOSEC) 20 MG capsule Take 20 mg by mouth every morning.    Yes Historical Provider, MD  simvastatin (ZOCOR) 40 MG tablet Take 40 mg by mouth every evening.   Yes Historical Provider, MD   BP 158/115 mmHg  Pulse 78  Temp(Src) 97.9 F (36.6 C) (Oral)  Resp 18  SpO2 100% Physical Exam  Constitutional: He appears well-developed and well-nourished. No distress.  HENT:  Mouth/Throat: Oropharynx is clear and moist.  Eyes: Conjunctivae are normal.  Neck: Neck supple. No tracheal deviation present.  Cardiovascular: Normal rate, regular rhythm, normal heart sounds and intact distal pulses.   Pulmonary/Chest: Effort normal and breath sounds normal. No accessory muscle usage. No respiratory distress.   Abdominal: Soft. Bowel sounds are normal. He exhibits no distension and no mass. There is no tenderness. There is no rebound and no guarding.  Genitourinary:  Normal ext genitalia. No scrotal or testicular pain or tenderness. No cva tenderness.   Musculoskeletal: Normal range of motion. He exhibits no edema or tenderness.  Neurological: He is alert.  Skin: Skin is warm and dry. No rash noted.  No petechia.   Psychiatric: He has a normal mood and affect.  Nursing note and vitals reviewed.   ED Course  Procedures (including critical care time) Labs Review  Results for orders placed or performed during the hospital encounter of 10/20/14  Urinalysis, Routine w reflex microscopic  Result Value Ref Range   Color, Urine RED (A) YELLOW   APPearance TURBID (A) CLEAR   Specific Gravity, Urine 1.025 1.005 - 1.030   pH 6.5 5.0 - 8.0   Glucose, UA NEGATIVE NEGATIVE mg/dL   Hgb urine dipstick LARGE (A) NEGATIVE   Bilirubin Urine NEGATIVE NEGATIVE   Ketones, ur NEGATIVE NEGATIVE mg/dL   Protein, ur >300 (A) NEGATIVE mg/dL   Urobilinogen, UA 1.0 0.0 - 1.0 mg/dL   Nitrite NEGATIVE NEGATIVE   Leukocytes, UA TRACE (A) NEGATIVE  Urine microscopic-add on  Result Value Ref Range   RBC / HPF TOO NUMEROUS TO COUNT <3 RBC/hpf   Urine-Other URINALYSIS PERFORMED ON SUPERNATANT       MDM   Labs.  Foley cath placed by nursing, bladder drained, only approximately 100 cc, bloody urine. No large clots.  Pt/spouse note hx similar symptoms in past associated w uti.    Pt feels he is able to urinate on own and empty bladder. No suprapubic or abd distension.  No fever or chills.   Pt able to urinate on own. No recurrent earlier symptoms.   Pt currently appears stable for d/c.   u cx pending.      Mirna Mires, MD 10/20/14 [phone]

## 2014-10-20 NOTE — ED Notes (Addendum)
Patient comes from [place] walk-in clinic with c/o hematuria.  Patient states he has been able to pass urine today.  Wife confirms this, but states patient has had little output.  According to wife, patient "soaked," his underwear and jeans and had to change into paper scrubs at [place].  Patient denies trauma to penis or scrotum.  Patient denies pain.  Patient denies dizziness, SOB, chest pain and N/V/D and fever.  On exam, patient's abdomen is soft and non-tender to palpation.  Bladder not palpable.  Bowel sounds hypoactive.  No blood noted from meatus.  No trauma noted to penis or scrotum.  Lung sounds clear in all lobes, heart sounds S1/S2, no murmur.  +2 radial pulses, +1 pedal pulses.  No peripheral edema noted.  Buccal mucosa and lower inner conjunctiva appear well vascularized.

## 2014-10-20 NOTE — Discharge Instructions (Signed)
It was our pleasure to provide your ER care today - we hope that you feel better.  Drink plenty of fluids.  Take keflex (antibiotic) as prescribed.  Follow up with your urologist in the next couple days - call office Monday to arrange follow up appointment.  A urine culture was sent the results of which will be back in 2-3 days time - have your doctor follow up on that result then.  Return to ER right away if worse, unable to void, fevers, vomiting, weak/faint, severe abdominal pain, other concern.        Hematuria Hematuria is blood in your urine. It can be caused by a bladder infection, kidney infection, prostate infection, kidney stone, or cancer of your urinary tract. Infections can usually be treated with medicine, and a kidney stone usually will pass through your urine. If neither of these is the cause of your hematuria, further workup to find out the reason may be needed. It is very important that you tell your health care provider about any blood you see in your urine, even if the blood stops without treatment or happens without causing pain. Blood in your urine that happens and then stops and then happens again can be a symptom of a very serious condition. Also, pain is not a symptom in the initial stages of many urinary cancers. HOME CARE INSTRUCTIONS   Drink lots of fluid, 3-4 quarts a day. If you have been diagnosed with an infection, cranberry juice is especially recommended, in addition to large amounts of water.  Avoid caffeine, tea, and carbonated beverages because they tend to irritate the bladder.  Avoid alcohol because it may irritate the prostate.  Take all medicines as directed by your health care provider.  If you were prescribed an antibiotic medicine, finish it all even if you start to feel better.  If you have been diagnosed with a kidney stone, follow your health care provider's instructions regarding straining your urine to catch the stone.  Empty your  bladder often. Avoid holding urine for long periods of time.  After a bowel movement, women should cleanse front to back. Use each tissue only once.  Empty your bladder before and after sexual intercourse if you are a male. SEEK MEDICAL CARE IF:  You develop back pain.  You have a fever.  You have a feeling of sickness in your stomach (nausea) or vomiting.  Your symptoms are not better in 3 days. Return sooner if you are getting worse. SEEK IMMEDIATE MEDICAL CARE IF:   You develop severe vomiting and are unable to keep the medicine down.  You develop severe back or abdominal pain despite taking your medicines.  You begin passing a large amount of blood or clots in your urine.  You feel extremely weak or faint, or you pass out. MAKE SURE YOU:   Understand these instructions.  Will watch your condition.  Will get help right away if you are not doing well or get worse. Document Released: 08/17/2005 Document Revised: 01/01/2014 Document Reviewed: 04/17/2013 [facility] Patient Information 2015 [place], [place]. This information is not intended to replace advice given to you by your health care provider. Make sure you discuss any questions you have with your health care provider.    Urinary Tract Infection Urinary tract infections (UTIs) can develop anywhere along your urinary tract. Your urinary tract is your body's drainage system for removing wastes and extra water. Your urinary tract includes two kidneys, two ureters, a bladder, and a urethra.  Your kidneys are a pair of bean-shaped organs. Each kidney is about the size of your fist. They are located below your ribs, one on each side of your spine. CAUSES Infections are caused by microbes, which are microscopic organisms, including fungi, viruses, and bacteria. These organisms are so small that they can only be seen through a microscope. Bacteria are the microbes that most commonly cause UTIs. SYMPTOMS  Symptoms of UTIs may vary  by age and gender of the patient and by the location of the infection. Symptoms in young women typically include a frequent and intense urge to urinate and a painful, burning feeling in the bladder or urethra during urination. Older women and men are more likely to be tired, shaky, and weak and have muscle aches and abdominal pain. A fever may mean the infection is in your kidneys. Other symptoms of a kidney infection include pain in your back or sides below the ribs, nausea, and vomiting. DIAGNOSIS To diagnose a UTI, your caregiver will ask you about your symptoms. Your caregiver also will ask to provide a urine sample. The urine sample will be tested for bacteria and white blood cells. White blood cells are made by your body to help fight infection. TREATMENT  Typically, UTIs can be treated with medication. Because most UTIs are caused by a bacterial infection, they usually can be treated with the use of antibiotics. The choice of antibiotic and length of treatment depend on your symptoms and the type of bacteria causing your infection. HOME CARE INSTRUCTIONS  If you were prescribed antibiotics, take them exactly as your caregiver instructs you. Finish the medication even if you feel better after you have only taken some of the medication.  Drink enough water and fluids to keep your urine clear or pale yellow.  Avoid caffeine, tea, and carbonated beverages. They tend to irritate your bladder.  Empty your bladder often. Avoid holding urine for long periods of time.  Empty your bladder before and after sexual intercourse.  After a bowel movement, women should cleanse from front to back. Use each tissue only once. SEEK MEDICAL CARE IF:   You have back pain.  You develop a fever.  Your symptoms do not begin to resolve within 3 days. SEEK IMMEDIATE MEDICAL CARE IF:   You have severe back pain or lower abdominal pain.  You develop chills.  You have nausea or vomiting.  You have continued  burning or discomfort with urination. MAKE SURE YOU:   Understand these instructions.  Will watch your condition.  Will get help right away if you are not doing well or get worse. Document Released: 05/27/2005 Document Revised: 02/16/2012 Document Reviewed: 09/25/2011 [facility] Patient Information 2015 [place], [place]. This information is not intended to replace advice given to you by your health care provider. Make sure you discuss any questions you have with your health care provider.

## 2014-10-20 NOTE — ED Notes (Addendum)
Pt from home c/o bleeding from penis since this am. Also c/o dysuria. HX of prostate CA.

## 2014-10-20 NOTE — ED Notes (Signed)
Bladder scan performed at bedside.  Patient has @ 139 ml in bladder post cath.  In and out completed - blood clots noted.

## 2014-10-21 LAB — URINE CULTURE
Colony Count: NO GROWTH
Culture: NO GROWTH

## 2014-10-30 ENCOUNTER — Other Ambulatory Visit: Payer: Self-pay | Admitting: Urology

## 2014-10-30 DIAGNOSIS — C61 Malignant neoplasm of prostate: Secondary | ICD-10-CM | POA: Diagnosis not present

## 2014-10-30 DIAGNOSIS — N200 Calculus of kidney: Secondary | ICD-10-CM | POA: Diagnosis not present

## 2014-10-30 DIAGNOSIS — R310 Gross hematuria: Secondary | ICD-10-CM | POA: Diagnosis not present

## 2014-10-31 NOTE — Patient Instructions (Addendum)
COUGAR IMEL  10/31/2014   Your procedure is scheduled on: Tuesday February 9th, 2016  Report to [facility]  Entrance and follow signs to               [place] at 100 PM  Call this number if you have problems the morning of surgery [phone]   Remember:  Do not eat food  :After Midnight, may have clear liquids from midnight night before surgery until 900 am day of surgery, no clear after 900 am day of surgery.     Take these medicines the morning of surgery with A SIP OF WATER: Prilosec, Namenda, eye drop if needed                               You may not have any metal on your body including hair pins and              piercings  Do not wear jewelry, make-up, lotions, powders or perfumes.             Do not wear nail polish.  Do not shave  48 hours prior to surgery.              Men may shave face and neck.   Do not bring valuables to the hospital. [place].  Contacts, dentures or bridgework may not be worn into surgery.  Leave suitcase in the car. After surgery it may be brought to your room.                  Please read over the following fact sheets you were given: _____________________________________________________________________                CLEAR LIQUID DIET   Foods Allowed                                                                     Foods Excluded  Coffee and tea, regular and decaf                             liquids that you cannot  Plain Jell-O in any flavor                                             see through such as: Fruit ices (not with fruit pulp)                                     milk, soups, orange juice  Iced Popsicles                                    All solid food Carbonated  beverages, regular and diet                                    Cranberry, grape and apple juices Sports drinks like Gatorade Lightly seasoned clear broth or consume(fat  free) Sugar, honey syrup  Sample Menu Breakfast                                Lunch                                     Supper Cranberry juice                    Beef broth                            Chicken broth Jell-O                                     Grape juice                           Apple juice Coffee or tea                        Jell-O                                      Popsicle                                                Coffee or tea                        Coffee or tea  _____________________________________________________________________  [facility] - Preparing for Surgery Before surgery, you can play an important role.  Because skin is not sterile, your skin needs to be as free of germs as possible.  You can reduce the number of germs on your skin by washing with CHG (chlorahexidine gluconate) soap before surgery.  CHG is an antiseptic cleaner which kills germs and bonds with the skin to continue killing germs even after washing. Please DO NOT use if you have an allergy to CHG or antibacterial soaps.  If your skin becomes reddened/irritated stop using the CHG and inform your nurse when you arrive at Short Stay. Do not shave (including legs and underarms) for at least 48 hours prior to the first CHG shower.  You may shave your face/neck. Please follow these instructions carefully:  1.  Shower with CHG Soap the night before surgery and the  morning of Surgery.  2.  If you choose to wash your hair, wash your hair first as usual with your  normal  shampoo.  3.  After you shampoo, rinse your hair and body thoroughly to remove the  shampoo.  4.  Use CHG as you would any other liquid soap.  You can apply chg directly  to the skin and wash                       Gently with a scrungie or clean washcloth.  5.  Apply the CHG Soap to your body ONLY FROM THE NECK DOWN.   Do not use on face/ open                           Wound or open sores. Avoid contact  with eyes, ears mouth and genitals (private parts).                       Wash face,  Genitals (private parts) with your normal soap.             6.  Wash thoroughly, paying special attention to the area where your surgery  will be performed.  7.  Thoroughly rinse your body with warm water from the neck down.  8.  DO NOT shower/wash with your normal soap after using and rinsing off  the CHG Soap.                9.  Pat yourself dry with a clean towel.            10.  Wear clean pajamas.            11.  Place clean sheets on your bed the night of your first shower and do not  sleep with pets. Day of Surgery : Do not apply any lotions/deodorants the morning of surgery.  Please wear clean clothes to the hospital/surgery center.  FAILURE TO FOLLOW THESE INSTRUCTIONS MAY RESULT IN THE CANCELLATION OF YOUR SURGERY PATIENT SIGNATURE_________________________________  NURSE SIGNATURE__________________________________  ________________________________________________________________________

## 2014-10-31 NOTE — Progress Notes (Signed)
Chest xray 08-17-14 epic 2 d echo 10-19-13 epic ekg 09-17-13 epic

## 2014-11-02 ENCOUNTER — Encounter (HOSPITAL_COMMUNITY)
Admission: RE | Admit: 2014-11-02 | Discharge: 2014-11-02 | Disposition: A | Discharge status: Home / Self Care | Payer: Medicare Other | Source: Ambulatory Visit | Attending: Urology | Admitting: Urology

## 2014-11-02 ENCOUNTER — Encounter (HOSPITAL_COMMUNITY): Payer: Self-pay

## 2014-11-02 DIAGNOSIS — Z01812 Encounter for preprocedural laboratory examination: Secondary | ICD-10-CM | POA: Diagnosis not present

## 2014-11-02 DIAGNOSIS — N3090 Cystitis, unspecified without hematuria: Secondary | ICD-10-CM | POA: Insufficient documentation

## 2014-11-02 HISTORY — DX: Hematuria, unspecified: R31.9

## 2014-11-02 LAB — BASIC METABOLIC PANEL
Anion gap: 6 (ref 5–15)
BUN: 13 mg/dL (ref 6–23)
CALCIUM: 9.6 mg/dL (ref 8.4–10.5)
CO2: 31 mmol/L (ref 19–32)
CREATININE: 0.79 mg/dL (ref 0.50–1.35)
Chloride: 106 mmol/L (ref 96–112)
GFR calc Af Amer: >90 mL/min (ref >90)
GFR calc non Af Amer: 79 mL/min — ABNORMAL LOW (ref >90)
Glucose, Bld: 90 mg/dL (ref 70–99)
Potassium: 4.5 mmol/L (ref 3.5–5.1)
Sodium: 143 mmol/L (ref 135–145)

## 2014-11-02 LAB — CBC
HCT: 46.6 % (ref 39.0–52.0)
Hemoglobin: 15.3 g/dL (ref 13.0–17.0)
MCH: 30.7 pg (ref 26.0–34.0)
MCHC: 32.8 g/dL (ref 30.0–36.0)
MCV: 93.6 fL (ref 78.0–100.0)
PLATELETS: 160 10*3/uL (ref 150–400)
RBC: 4.98 MIL/uL (ref 4.22–5.81)
RDW: 13.2 % (ref 11.5–15.5)
WBC: 6.8 10*3/uL (ref 4.0–10.5)

## 2014-11-05 ENCOUNTER — Encounter (HOSPITAL_COMMUNITY): Payer: Self-pay

## 2014-11-06 MED ORDER — GENTAMICIN SULFATE 40 MG/ML IJ SOLN
5.0000 mg/kg | INTRAMUSCULAR | Status: AC
  Administered 2014-11-07: 340 mg via INTRAVENOUS
  Filled 2014-11-06: qty 8.5

## 2014-11-07 ENCOUNTER — Ambulatory Visit (HOSPITAL_COMMUNITY): Payer: Medicare Other | Admitting: Registered Nurse

## 2014-11-07 ENCOUNTER — Encounter (HOSPITAL_COMMUNITY): Payer: Self-pay | Admitting: *Deleted

## 2014-11-07 ENCOUNTER — Encounter (HOSPITAL_COMMUNITY)
Admission: AD | Disposition: A | Discharge status: Home / Self Care | Payer: Self-pay | Source: Ambulatory Visit | Attending: Urology

## 2014-11-07 ENCOUNTER — Ambulatory Visit (HOSPITAL_COMMUNITY)
Admission: AD | Admit: 2014-11-07 | Discharge: 2014-11-08 | Disposition: A | Discharge status: Home / Self Care | Payer: Medicare Other | Source: Ambulatory Visit | Attending: Urology | Admitting: Urology

## 2014-11-07 DIAGNOSIS — Z8546 Personal history of malignant neoplasm of prostate: Secondary | ICD-10-CM | POA: Diagnosis not present

## 2014-11-07 DIAGNOSIS — R310 Gross hematuria: Secondary | ICD-10-CM | POA: Diagnosis not present

## 2014-11-07 DIAGNOSIS — R319 Hematuria, unspecified: Secondary | ICD-10-CM | POA: Diagnosis present

## 2014-11-07 DIAGNOSIS — N210 Calculus in bladder: Secondary | ICD-10-CM | POA: Diagnosis not present

## 2014-11-07 DIAGNOSIS — Z8673 Personal history of transient ischemic attack (TIA), and cerebral infarction without residual deficits: Secondary | ICD-10-CM | POA: Insufficient documentation

## 2014-11-07 DIAGNOSIS — Z87891 Personal history of nicotine dependence: Secondary | ICD-10-CM | POA: Insufficient documentation

## 2014-11-07 DIAGNOSIS — N3041 Irradiation cystitis with hematuria: Secondary | ICD-10-CM | POA: Insufficient documentation

## 2014-11-07 DIAGNOSIS — Z79899 Other long term (current) drug therapy: Secondary | ICD-10-CM | POA: Diagnosis not present

## 2014-11-07 DIAGNOSIS — N400 Enlarged prostate without lower urinary tract symptoms: Secondary | ICD-10-CM | POA: Diagnosis not present

## 2014-11-07 DIAGNOSIS — K219 Gastro-esophageal reflux disease without esophagitis: Secondary | ICD-10-CM | POA: Diagnosis not present

## 2014-11-07 DIAGNOSIS — F0280 Dementia in other diseases classified elsewhere without behavioral disturbance: Secondary | ICD-10-CM | POA: Insufficient documentation

## 2014-11-07 DIAGNOSIS — N3289 Other specified disorders of bladder: Secondary | ICD-10-CM | POA: Diagnosis not present

## 2014-11-07 DIAGNOSIS — G309 Alzheimer's disease, unspecified: Secondary | ICD-10-CM | POA: Insufficient documentation

## 2014-11-07 DIAGNOSIS — F0390 Unspecified dementia without behavioral disturbance: Secondary | ICD-10-CM | POA: Diagnosis not present

## 2014-11-07 DIAGNOSIS — N3040 Irradiation cystitis without hematuria: Secondary | ICD-10-CM | POA: Diagnosis not present

## 2014-11-07 HISTORY — PX: CYSTOSCOPY: SHX5120

## 2014-11-07 HISTORY — PX: TRANSURETHRAL RESECTION OF PROSTATE: SHX73

## 2014-11-07 SURGERY — TRANSURETHRAL RESECTION OF THE PROSTATE WITH GYRUS INSTRUMENTS
Anesthesia: Spinal

## 2014-11-07 MED ORDER — FENTANYL CITRATE 0.05 MG/ML IJ SOLN
INTRAMUSCULAR | Status: AC
  Filled 2014-11-07: qty 2

## 2014-11-07 MED ORDER — TRAMADOL HCL 50 MG PO TABS
50.0000 mg | ORAL_TABLET | Freq: Four times a day (QID) | ORAL | Status: DC | PRN
Start: 2014-11-07 — End: 2014-11-08

## 2014-11-07 MED ORDER — ONDANSETRON HCL 4 MG/2ML IJ SOLN: 4.0000 mg | INTRAMUSCULAR | Status: DC | PRN

## 2014-11-07 MED ORDER — PROPOFOL 10 MG/ML IV BOLUS
INTRAVENOUS | Status: AC
Start: 2014-11-07 — End: 2014-11-07
  Filled 2014-11-07: qty 20

## 2014-11-07 MED ORDER — STERILE WATER FOR IRRIGATION IR SOLN
Status: DC | PRN
  Administered 2014-11-07: 500 mL

## 2014-11-07 MED ORDER — 0.9 % SODIUM CHLORIDE (POUR BTL) OPTIME
TOPICAL | Status: DC | PRN
  Administered 2014-11-07: 1000 mL

## 2014-11-07 MED ORDER — MEMANTINE HCL 5 MG PO TABS
5.0000 mg | ORAL_TABLET | Freq: Two times a day (BID) | ORAL | Status: DC
  Administered 2014-11-08: 5 mg via ORAL
  Filled 2014-11-07 (×2): qty 1

## 2014-11-07 MED ORDER — VITAMIN B-12 1000 MCG PO TABS
1000.0000 ug | ORAL_TABLET | Freq: Every morning | ORAL | Status: DC
  Administered 2014-11-08: 1000 ug via ORAL
  Filled 2014-11-07: qty 1

## 2014-11-07 MED ORDER — SIMVASTATIN 20 MG PO TABS
20.0000 mg | ORAL_TABLET | Freq: Every evening | ORAL | Status: DC
  Filled 2014-11-07 (×2): qty 1

## 2014-11-07 MED ORDER — PROPOFOL 10 MG/ML IV BOLUS
INTRAVENOUS | Status: AC
  Filled 2014-11-07: qty 20

## 2014-11-07 MED ORDER — PROPOFOL 10 MG/ML IV BOLUS
INTRAVENOUS | Status: DC | PRN
  Administered 2014-11-07: 20 mg via INTRAVENOUS

## 2014-11-07 MED ORDER — HYDROMORPHONE HCL 1 MG/ML IJ SOLN: 0.5000 mg | INTRAMUSCULAR | Status: DC | PRN

## 2014-11-07 MED ORDER — KCL IN DEXTROSE-NACL 20-5-0.45 MEQ/L-%-% IV SOLN
INTRAVENOUS | Status: DC
  Administered 2014-11-07: 18:00:00 via INTRAVENOUS
  Filled 2014-11-07 (×2): qty 1000

## 2014-11-07 MED ORDER — PANTOPRAZOLE SODIUM 40 MG PO TBEC
40.0000 mg | DELAYED_RELEASE_TABLET | Freq: Every day | ORAL | Status: DC
  Administered 2014-11-08: 40 mg via ORAL
  Filled 2014-11-07: qty 1

## 2014-11-07 MED ORDER — LACTATED RINGERS IV SOLN
INTRAVENOUS | Status: DC
  Administered 2014-11-07: 1000 mL via INTRAVENOUS

## 2014-11-07 MED ORDER — FENTANYL CITRATE 0.05 MG/ML IJ SOLN: 25.0000 ug | INTRAMUSCULAR | Status: DC | PRN

## 2014-11-07 MED ORDER — SENNOSIDES-DOCUSATE SODIUM 8.6-50 MG PO TABS
1.0000 | ORAL_TABLET | Freq: Two times a day (BID) | ORAL | Status: DC
  Administered 2014-11-07 – 2014-11-08 (×2): 1 via ORAL
  Filled 2014-11-07 (×2): qty 1

## 2014-11-07 MED ORDER — FENTANYL CITRATE 0.05 MG/ML IJ SOLN
INTRAMUSCULAR | Status: DC | PRN
  Administered 2014-11-07: 50 ug via INTRAVENOUS

## 2014-11-07 MED ORDER — SODIUM CHLORIDE 0.9 % IR SOLN
Status: DC | PRN
  Administered 2014-11-07: 12000 mL via INTRAVESICAL

## 2014-11-07 MED ORDER — ACETAMINOPHEN 325 MG PO TABS
650.0000 mg | ORAL_TABLET | ORAL | Status: DC | PRN
  Administered 2014-11-07: 650 mg via ORAL
  Filled 2014-11-07: qty 2

## 2014-11-07 MED ORDER — BUPIVACAINE IN DEXTROSE 0.75-8.25 % IT SOLN
INTRATHECAL | Status: DC | PRN
  Administered 2014-11-07: 1.6 mL via INTRATHECAL

## 2014-11-07 MED ORDER — PROPOFOL INFUSION 10 MG/ML OPTIME
INTRAVENOUS | Status: DC | PRN
  Administered 2014-11-07: 75 ug/kg/min via INTRAVENOUS

## 2014-11-07 SURGICAL SUPPLY — 20 items
BAG URINE DRAINAGE (UROLOGICAL SUPPLIES) ×3 IMPLANT
BAG URO CATCHER STRL LF (DRAPE) ×3 IMPLANT
CATH FOLEY 3WAY 30CC 22FR (CATHETERS) ×3 IMPLANT
CLOTH BEACON ORANGE TIMEOUT ST (SAFETY) ×3 IMPLANT
DRAPE CAMERA CLOSED 9X96 (DRAPES) ×3 IMPLANT
ELECT BIVAP BIPO 22/24 DONUT (ELECTROSURGICAL) ×3
ELECTRD BIVAP BIPO 22/24 DONUT (ELECTROSURGICAL) ×1 IMPLANT
GLOVE BIOGEL M STRL SZ7.5 (GLOVE) ×3 IMPLANT
GOWN STRL REUS W/TWL LRG LVL3 (GOWN DISPOSABLE) ×6 IMPLANT
HOLDER FOLEY CATH W/STRAP (MISCELLANEOUS) IMPLANT
IV NS IRRIG 3000ML ARTHROMATIC (IV SOLUTION) ×6 IMPLANT
KIT ASPIRATION TUBING (SET/KITS/TRAYS/PACK) IMPLANT
LOOP CUT BIPOLAR 24F LRG (ELECTROSURGICAL) ×3 IMPLANT
MANIFOLD NEPTUNE II (INSTRUMENTS) ×3 IMPLANT
PACK CYSTO (CUSTOM PROCEDURE TRAY) ×3 IMPLANT
PLUG CATH AND CAP STER (CATHETERS) ×3 IMPLANT
SYR 30ML LL (SYRINGE) IMPLANT
SYRINGE IRR TOOMEY STRL 70CC (SYRINGE) ×3 IMPLANT
TUBING CONNECTING 10 (TUBING) ×2 IMPLANT
TUBING CONNECTING 10' (TUBING) ×1

## 2014-11-07 NOTE — H&P (Signed)
[name] is an 81 y.o. male.    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 Very remote smoker. Seeing blood on / off x several mos. CT Urogram 08/2014 unremarkable (brachy prostate seeds in good position). Cysto 08/2014 with radiation prostatitis changes, no masses. UCX negative during episode.   2 - Prostate Cancer - s/p brachytherapy in [facility] years ago. Unknown grade / stage, this was performed after TURP. Recent Surveillance: 07/2014 DRE 40gm / PSA 0.22  3 - Lower Urinary Tract Symptoms - s/p TURP 1998 per report, now minimal bother.  4 - Nephrolithiasis - s/p prior endoscopic removal x 1 in 1990s per report. CT 2016 stone free.   PMH sig for advanced Alzheimer's Dementia (on meds, adult daycare 3 days/week, wife take primary care of him). His PCP is Jacob Manes MD with [facility] at [place].  Today "[name]" is seen to proceed with transurethral vaporization / ablation of radiation cystitis lesions for goal of decreasing his recurrent gross hematuria. Most recent UCX negative.   Past Medical History  Diagnosis Date  . GERD (gastroesophageal reflux disease)   . Heart murmur     systolic murmur no further eval d/t Alzheimers dx  . Dementia     Alzheimers  . Cancer     Prostate  . Stroke     TIA  . Hematuria     Past Surgical History  Procedure Laterality Date  . Esophagogastroduodenoscopy [name] 07/03/2014    Procedure: ESOPHAGOGASTRODUODENOSCOPY (EGD);  Surgeon: Garlan Fair, MD;  Location: [facility];  Service: Endoscopy;  Laterality: [name];  . Balloon dilation [name] 07/03/2014    Procedure: BALLOON DILATION;  Surgeon: Garlan Fair, MD;  Location: [facility];  Service: Endoscopy;  Laterality: [name];  . Seed implant for prostate cancer  2006  . Hernia repair  1970    double    Family History  Problem Relation  Age of Onset  . Heart disease Mother    Social History:  reports that he quit smoking about 46 years ago. His smoking use included Cigarettes. He has a 10 pack-year smoking history. He has never used smokeless tobacco. He reports that he does not drink alcohol or use illicit drugs.  Allergies: No Known Allergies  No prescriptions prior to admission    No results found for this or any previous visit (from the past 48 hour(s)). No results found.  Review of Systems  Constitutional: Negative.  Negative for fever and chills.  HENT: Negative.   Eyes: Negative.   Respiratory: Negative.   Cardiovascular: Negative.   Gastrointestinal: Negative.   Genitourinary: Negative.   Musculoskeletal: Negative.   Skin: Negative.   Neurological: Negative.   Endo/Heme/Allergies: Negative.   Psychiatric/Behavioral: Negative.     There were no vitals taken for this visit. Physical Exam  Constitutional: He appears well-developed.  HENT:  Head: Normocephalic.  Eyes: Pupils are equal, round, and reactive to light.  Neck: Normal range of motion.  Cardiovascular: Normal rate.   Respiratory: Effort normal.  GI: Soft.  Genitourinary: Penis normal.  Musculoskeletal: Normal range of motion.  Neurological: He is alert.  Stigmata of moderate dementia, non-combative  Skin: Skin is warm.  Psychiatric: He has a normal mood and affect. His behavior is normal. Judgment and thought content normal.     Assessment/Plan       1- Gross Hematuria /  Radiation Cystitis- eval wtih labs, exam, imaging, cysto with prostate radiation changes as most likley source. Rediscussed options of observation v. transurethral fulgeration / channel TURP / vaporization v. Hyperbaric O2.   As his hematuria is now recurrent and prompting ER visits, I recommend transurethral vaporization of prostate to cauterize mucosal surface / friable vessels and biopsy any abnormal tissue, they want to proceed. Risks including bleeding,  infection, incontinence, non-cure, need for tubes / catheters, as well as DVT, PE, MI, CVA, Mortality reiterated. Pt and wife voiced understanding and desire to proceed as planned today.   2 - Prostate Cancer - excellent biochemical control, continue yearly surveillance.   3 - Lower Urinary Tract Symptoms - excellent function s/p prior TURP, some regrowth by cysto 2016 but not highly obsctructive.   4 - Nephrolithiasis - stone free by most recent imaging, observe.   [name] 11/07/2014, 6:23 AM

## 2014-11-07 NOTE — Transfer of Care (Signed)
Immediate Anesthesia Transfer of Care Note  Patient: CORRY IHNEN  Procedure(s) Performed: Procedure(s) (LRB): TRANSURETHRAL VAPORIZATION OF THE PROSTATE AND BLADDER LESIONS WITH ERBE (NEW GYRUS) REMOVAL OF BLADDER STONE; PROSTATIC URETHRA BIOPSY ([name]) CYSTOSCOPY ([name])  Patient Location: PACU  Anesthesia Type: Spinal  Level of Consciousness: sedated, patient cooperative and responds to stimulation  Airway & Oxygen Therapy: Patient Spontanous Breathing and Patient connected to face mask oxgen  Post-op Assessment: Report given to PACU RN and Post -op Vital signs reviewed and stable  Post vital signs: Reviewed and stable  Complications: No apparent anesthesia complications

## 2014-11-07 NOTE — Brief Op Note (Signed)
11/07/2014  3:20 PM  PATIENT:  Deniece Portela  81 y.o. male  PRE-OPERATIVE DIAGNOSIS:  RADIATION CYSTITIS, HEMATURIA  POST-OPERATIVE DIAGNOSIS:  RADIATION CYSTITIS, HEMATURIA, Bladder Stone  PROCEDURE:  Procedure(s): TRANSURETHRAL VAPORIZATION OF THE PROSTATE AND BLADDER LESIONS WITH ERBE (NEW GYRUS) REMOVAL OF BLADDER STONE; PROSTATIC URETHRA BIOPSY ([name]) CYSTOSCOPY ([name])  SURGEON:  Surgeon(s) and Role:    * Alexis Frock, MD - Primary  PHYSICIAN ASSISTANT:   ASSISTANTS: none   ANESTHESIA:   spinal  EBL:     BLOOD ADMINISTERED:none  DRAINS: 19F foley to NS irrigation   LOCAL MEDICATIONS USED:  NONE  SPECIMEN:  Source of Specimen:  1 - bladder stone, 2 - prostatic urethra  DISPOSITION OF SPECIMEN:  PATHOLOGY  COUNTS:  YES  TOURNIQUET:  * No tourniquets in log *  DICTATION: .Other Dictation: Dictation Number [id number]  PLAN OF CARE: Admit for overnight observation  PATIENT DISPOSITION:  PACU - hemodynamically stable.   Delay start of Pharmacological VTE agent (>24hrs) due to surgical blood loss or risk of bleeding: yes

## 2014-11-07 NOTE — Anesthesia Preprocedure Evaluation (Addendum)
Anesthesia Evaluation  Patient identified by MRN, date of birth, ID band Patient awake    Reviewed: Allergy & Precautions, NPO status , Patient's Chart, lab work & pertinent test results  Airway Mallampati: II  TM Distance: >3 FB Neck ROM: Full    Dental no notable dental hx.    Pulmonary former smoker,  breath sounds clear to auscultation  Pulmonary exam normal       Cardiovascular negative cardio ROS  Rhythm:Regular Rate:Normal  ECHO: 08-18-14: Normal EF, diastolic dysfunction, mild aortic stenosis.  ECG: SB 49, First degree AVB   Neuro/Psych PSYCHIATRIC DISORDERS CVA    GI/Hepatic Neg liver ROS, GERD-  Medicated,  Endo/Other  negative endocrine ROS  Renal/GU negative Renal ROS  negative genitourinary   Musculoskeletal negative musculoskeletal ROS (+)   Abdominal   Peds negative pediatric ROS (+)  Hematology negative hematology ROS (+)   Anesthesia Other Findings   Reproductive/Obstetrics negative OB ROS                            Anesthesia Physical Anesthesia Plan  ASA: III  Anesthesia Plan: Spinal   Post-op Pain Management:    Induction: Intravenous  Airway Management Planned:   Additional Equipment:   Intra-op Plan:   Post-operative Plan: Extubation in OR  Informed Consent: I have reviewed the patients History and Physical, chart, labs and discussed the procedure including the risks, benefits and alternatives for the proposed anesthesia with the patient or authorized representative who has indicated his/her understanding and acceptance.   Dental advisory given  Plan Discussed with: CRNA  Anesthesia Plan Comments: (Platelets 160K. Discussed spinal and general with patient and patient's wife. She prefers spinal, due to his dementia, I think that a spinal is reasonable.  Discussed risks/benefits of spinal including headache, backache, failure, bleeding, infection, and  nerve damage. Patient consents to spinal. Questions answered. Coagulation studies and platelet count acceptable.)       Anesthesia Quick Evaluation

## 2014-11-07 NOTE — Anesthesia Procedure Notes (Signed)
Spinal Patient location during procedure: OR End time: 11/07/2014 2:37 PM Staffing Anesthesiologist: Franne Grip Resident/CRNA: Lajuana Carry E Performed by: resident/CRNA  Preanesthetic Checklist Completed: patient identified, site marked, surgical consent, pre-op evaluation, timeout performed, IV checked, risks and benefits discussed and monitors and equipment checked Spinal Block Patient position: sitting Prep: Betadine Patient monitoring: heart rate, continuous pulse ox and blood pressure Location: L3-4 Injection technique: single-shot Needle Needle type: Spinocan  Needle gauge: 22 G Needle length: 9 cm Assessment Sensory level: T6 Additional Notes Expiration date of kit checked and confirmed. Second attempt with clear CSF, neg heme, neg paresthesia. Patient tolerated procedure well, without complications, return to supine and moved to OR table with assistance.

## 2014-11-07 NOTE — Anesthesia Postprocedure Evaluation (Signed)
  Anesthesia Post-op Note  Patient: [name]  Procedure(s) Performed: Procedure(s) (LRB): TRANSURETHRAL VAPORIZATION OF THE PROSTATE AND BLADDER LESIONS WITH ERBE (NEW GYRUS) REMOVAL OF BLADDER STONE; PROSTATIC URETHRA BIOPSY ([name]) CYSTOSCOPY ([name])  Patient Location: PACU  Anesthesia Type: Spinal  Level of Consciousness: awake and alert   Airway and Oxygen Therapy: Patient Spontanous Breathing  Post-op Pain: mild  Post-op Assessment: Post-op Vital signs reviewed, Patient's Cardiovascular Status Stable, Respiratory Function Stable, Patent Airway and No signs of Nausea or vomiting  Last Vitals:  Filed Vitals:   11/07/14 1708  BP: 157/69  Pulse: 52  Temp: 36.4 C  Resp: 16    Post-op Vital Signs: stable   Complications: No apparent anesthesia complications

## 2014-11-08 ENCOUNTER — Encounter (HOSPITAL_COMMUNITY): Payer: Self-pay | Admitting: Urology

## 2014-11-08 DIAGNOSIS — N3041 Irradiation cystitis with hematuria: Secondary | ICD-10-CM | POA: Diagnosis not present

## 2014-11-08 MED ORDER — TRAMADOL HCL 50 MG PO TABS: 50.0000 mg | ORAL_TABLET | Freq: Four times a day (QID) | ORAL | Status: DC | PRN

## 2014-11-08 NOTE — Op Note (Signed)
[name], [name] NO.:  [id number]  MEDICAL RECORD NO.:  [id number]  LOCATION:  5409                         FACILITY:  [facility]  PHYSICIAN:  Alexis Frock, MD     DATE OF BIRTH:  November 11, 1925  DATE OF PROCEDURE:  11/07/2014 DATE OF DISCHARGE:                              OPERATIVE REPORT   PREOPERATIVE DIAGNOSES:  Recurrent gross hematuria, radiation cystitis, prostatic regrowth.  POSTOPERATIVE DIAGNOSES:  Recurrent gross hematuria, radiation cystitis, prostatic regrowth plus small bladder stone.  PROCEDURE: 1. Cystolitholapaxy stone less than 2 cm. 2. Transurethral vaporization of prostate. 3. Bladder biopsy with fulguration.  ESTIMATED BLOOD LOSS:  less than 100 mL.  COMPLICATIONS:  None.  SPECIMENS: 1. Bladder/prostatic urethral stone for identification only. 2. Prostatic urethral bladder neck biopsy.  DRAINS:  24-French 3-way Foley catheter normal saline irrigation, efflux light pink.  FINDINGS: 1. Friable erythema at the bladder neck and prostatic urethra     consistent with radiation cystitis. 2. Small bladder stone approximately 1 cm. 3. Some regrowth of lateral prostatic lobes after prior TURP 4. Complete vaporization of all erythematous bladder neck and     prostatic urethral tissue, wide open channel from the verumontanum     to the bladder neck. 5. No evidence of bladder perforation or damage to ureteral orifices     following above.  ANESTHESIA:  Spinal plus sedation.  INDICATION:  [name] is an 81 year old gentleman with advanced dementia, history of prostate cancer status post brachytherapy as well as prostatic hypertrophy status post prior transurethral resection of the prostate.  He presented with recurrent gross hematuria with clots that was initially very infrequent and small volume.  He underwent office cystoscopy that corroborated likely radiation cystitis changes, unfortunately symptoms progressed requiring several ER  visits with worsening hematuria and clots as well as progression of his radiation cystitis.  Options were discussed for management including surveillance only versus medical therapy versus hyperbaric oxygen versus transurethral ablation and biopsy, and he wished to proceed with the latter.  Informed consent was obtained and placed in medical record.  PROCEDURE IN DETAIL:  The patient being [name] was verified. Procedure being transurethral resection of the prostate with biopsy was confirmed.  Procedure was carried out.  Time-out was performed. Intravenous antibiotics were administered.  Spinal anesthesia with sedation was administered.  The patient was placed into a low lithotomy position.  Sterile field was created by prepping the patient's penis, perineum, and proximal thighs using iodine x3.  Next, cystourethroscopy was performed using 26-French resectoscope sheath with visual obturator and 30-degree offset lens.  Inspection of anterior and posterior urethra revealed friable erythematous tissue in the area of prostatic urethra and bladder neck.  There was also a small stone in this location approximately 1 cm.  There was also some lateral lobe growth of the prostate.  There were no obvious focal masses.  The bladder stone was grasped with flexible graspers and removed in its entirety, set aside for ID only.  On biopsy, there is an area of the erythematous tissue and prostatic urethra was also performed using cold cup forceps, this was set aside, labeled prostatic urethral biopsy, history of radiation to the  prostate, this bladder neck and prostatic urethral tissue was quite friable and felt to be likely the source of this recurrent hematuria, also aggravated by the small stone, which had not been previously noted. As such, the button electrode was used and using bipolar current and normal saline irrigation very careful vaporization was performed of the entire prostatic urethral  mucosa as well as some purposeful ablation of the lateral lobe regrowth.  Additional coagulation current was applied to several areas of radiation cystitis changes of the bladder neck taking great care to avoid any injury to the ureteral orifices, which did not occur.  Following these maneuvers, there was excellent hemostasis, complete resolution of all erythematous likely reactive tissue, there was wide open urinary channel from the membranous urethra towards the bladder neck.  There was no evidence of any bladder injury, perforation, or injury to the perforation of the prostatic urethra.  As such, the resectoscope was exchanged for a new 24-French 3-way Foley catheter with 10 mL of sterile water in the balloon.  This connected to normal saline irrigation with the efflux being essentially clear. Procedure was terminated.  The patient tolerated the procedure well. There were no immediate periprocedural complications.  The patient was taken to the postanesthesia care unit in stable condition.          ______________________________ Alexis Frock, MD     TM/MEDQ  D:  11/07/2014  T:  11/08/2014  Job:  [id number]

## 2014-11-08 NOTE — Discharge Summary (Signed)
Physician Discharge Summary  Patient ID: [name] MRN: [id number] DOB/AGE: 79-Nov-1927 79 y.o.  Admit date: 11/07/2014 Discharge date: 11/08/2014  Admission Diagnoses: Radiation Cystitis, Bladder Stone, Prostate Cancer  Discharge Diagnoses:   Radiation Cystitis, Bladder Stone, Prostate Cancer  Discharged Condition: good  Hospital Course:   1- Radiation Cystitis, Bladder Stone, Prostate Cancer - s/p cysto, transurethral vaporization of prostate, bladder biopsy, and cystolithalopexy on 11/07/14, the day of admission, without acute complications. On POD 1, the day of discharge, he is ambulatory, tollerating diet, pain controlled on PO meds and passed voiding trial with instillation of 200cc NS and voiding back 150cc.     Consults: None  Significant Diagnostic Studies: labs: bladder biopsy - penidng  Treatments: surgery: cysto, transurethral vaporization of prostate, bladder biopsy, and cystolithalopexy on 11/07/14  Discharge Exam: Blood pressure 118/63, pulse 54, temperature 98.3 F (36.8 C), temperature source Oral, resp. rate 16, height 5\' 8"  (1.727 m), weight 67.388 kg (148 lb 9 oz), SpO2 98 %. General appearance: alert, cooperative, appears stated age and wife at bedsie, at baseline in terms of moderate dementia, non-combative.  Eyes: negative Throat: lips, mucosa, and tongue normal; teeth and gums normal Neck: supple, symmetrical, trachea midline Back: symmetric, no curvature. ROM normal. No CVA tenderness. Resp: clear to auscultation bilaterally Cardio: Nl rate GI: soft, non-tender; bowel sounds normal; no masses,  no organomegaly Male genitalia: normal, fill and pull perofremd as per above.  Extremities: extremities normal, atraumatic, no cyanosis or edema Lymph nodes: Cervical, supraclavicular, and axillary nodes normal. Neurologic: Grossly normal  Disposition: 01-Home or Self Care     Medication List    ASK your doctor about these medications        beta  carotene w/minerals tablet  Take 1 tablet by mouth every morning.     cephALEXin 500 MG capsule  Commonly known as:  KEFLEX  Take 1 capsule (500 mg total) by mouth 3 (three) times daily.     Co Q 10 100 MG Caps  Take 1 capsule by mouth daily.     cyanocobalamin 1000 MCG tablet  Take 1,000 mcg by mouth every morning.     LOTEMAX 0.5 % Gel  Generic drug:  Loteprednol Etabonate  Place 1 drop into the left eye as needed (pain).     memantine 10 MG tablet  Commonly known as:  NAMENDA  Take 5 mg by mouth 2 (two) times daily.     multivitamin with minerals Tabs tablet  Take 1 tablet by mouth daily.     OMEGA 3 PO  Take 1 capsule by mouth daily.     omeprazole 20 MG capsule  Commonly known as:  PRILOSEC  Take 20 mg by mouth every morning.     Red Yeast Rice 600 MG Caps  Take 1 capsule by mouth daily.     simvastatin 40 MG tablet  Commonly known as:  ZOCOR  Take 20 mg by mouth every evening.     SYSTANE NIGHTTIME OP  Apply 1 application to eye at bedtime.     VITAMIN C PO  Take 1 capsule by mouth daily.           Follow-up Information    Follow up with Alexis Frock, MD On 11/27/2014.   Specialty:  Urology   Why:  at 1 pm for MD visit   Contact information:   [place] [address] [phone]       Signed: Alexis Frock 11/08/2014, 7:39 AM

## 2014-11-08 NOTE — Care Management Note (Signed)
    Page 1 of 1   11/08/2014     11:21:17 AM CARE MANAGEMENT NOTE 11/08/2014  Patient:  [name], [name]   Account Number:  [id number]  Date Initiated:  11/08/2014  Documentation initiated by:  Dessa Phi  Subjective/Objective Assessment:   81 y/o m admitted w/hematuria.[id number].     Action/Plan:   From home w/family.   Anticipated DC Date:  11/08/2014   Anticipated DC Plan:  [place]  CM consult      Choice offered to / List presented to:             Status of service:  Completed, signed off Medicare Important Message given?   (If response is "NO", the following Medicare IM given date fields will be blank) Date Medicare IM given:   Medicare IM given by:   Date Additional Medicare IM given:   Additional Medicare IM given by:    Discharge Disposition:  HOME/SELF CARE  Per UR Regulation:  Reviewed for med. necessity/level of care/duration of stay  If discussed at [place] of Stay Meetings, dates discussed:    Comments:  11/08/14 Dessa Phi RN BSN NCM [phone] d/c home.No orders or needs.

## 2014-11-08 NOTE — Discharge Instructions (Signed)
1 - You may have urinary urgency (bladder spasms) and bloody urine on / off x few days. This is normal. ° °2 - Call MD or go to ER for fever >102, severe pain / nausea / vomiting not relieved by medications, or acute change in medical status ° °

## 2014-11-19 DIAGNOSIS — C61 Malignant neoplasm of prostate: Secondary | ICD-10-CM | POA: Diagnosis not present

## 2014-11-19 DIAGNOSIS — N400 Enlarged prostate without lower urinary tract symptoms: Secondary | ICD-10-CM | POA: Diagnosis not present

## 2014-12-25 DIAGNOSIS — G301 Alzheimer's disease with late onset: Secondary | ICD-10-CM | POA: Diagnosis not present

## 2014-12-25 DIAGNOSIS — I350 Nonrheumatic aortic (valve) stenosis: Secondary | ICD-10-CM | POA: Diagnosis not present

## 2015-05-29 ENCOUNTER — Other Ambulatory Visit: Payer: Self-pay | Admitting: Gastroenterology

## 2015-05-30 ENCOUNTER — Encounter (HOSPITAL_COMMUNITY): Payer: Self-pay | Admitting: *Deleted

## 2015-06-03 ENCOUNTER — Ambulatory Visit (HOSPITAL_COMMUNITY)
Admission: RE | Admit: 2015-06-03 | Discharge: 2015-06-03 | Disposition: A | Discharge status: Home / Self Care | Payer: Medicare Other | Source: Ambulatory Visit | Attending: Gastroenterology | Admitting: Gastroenterology

## 2015-06-03 ENCOUNTER — Encounter (HOSPITAL_COMMUNITY): Payer: Self-pay

## 2015-06-03 ENCOUNTER — Ambulatory Visit (HOSPITAL_COMMUNITY): Payer: Medicare Other | Admitting: Certified Registered Nurse Anesthetist

## 2015-06-03 ENCOUNTER — Encounter (HOSPITAL_COMMUNITY)
Admission: RE | Disposition: A | Discharge status: Home / Self Care | Payer: Self-pay | Source: Ambulatory Visit | Attending: Gastroenterology

## 2015-06-03 DIAGNOSIS — G309 Alzheimer's disease, unspecified: Secondary | ICD-10-CM | POA: Insufficient documentation

## 2015-06-03 DIAGNOSIS — K219 Gastro-esophageal reflux disease without esophagitis: Secondary | ICD-10-CM | POA: Insufficient documentation

## 2015-06-03 DIAGNOSIS — I350 Nonrheumatic aortic (valve) stenosis: Secondary | ICD-10-CM | POA: Diagnosis not present

## 2015-06-03 DIAGNOSIS — Z8673 Personal history of transient ischemic attack (TIA), and cerebral infarction without residual deficits: Secondary | ICD-10-CM | POA: Diagnosis not present

## 2015-06-03 DIAGNOSIS — R911 Solitary pulmonary nodule: Secondary | ICD-10-CM | POA: Diagnosis not present

## 2015-06-03 DIAGNOSIS — K222 Esophageal obstruction: Secondary | ICD-10-CM | POA: Diagnosis not present

## 2015-06-03 DIAGNOSIS — K229 Disease of esophagus, unspecified: Secondary | ICD-10-CM | POA: Diagnosis not present

## 2015-06-03 DIAGNOSIS — Z8546 Personal history of malignant neoplasm of prostate: Secondary | ICD-10-CM | POA: Diagnosis not present

## 2015-06-03 DIAGNOSIS — Z89022 Acquired absence of left finger(s): Secondary | ICD-10-CM | POA: Insufficient documentation

## 2015-06-03 DIAGNOSIS — Z87891 Personal history of nicotine dependence: Secondary | ICD-10-CM | POA: Insufficient documentation

## 2015-06-03 DIAGNOSIS — K449 Diaphragmatic hernia without obstruction or gangrene: Secondary | ICD-10-CM | POA: Insufficient documentation

## 2015-06-03 DIAGNOSIS — E7800 Pure hypercholesterolemia, unspecified: Secondary | ICD-10-CM | POA: Insufficient documentation

## 2015-06-03 DIAGNOSIS — R1310 Dysphagia, unspecified: Secondary | ICD-10-CM | POA: Diagnosis not present

## 2015-06-03 HISTORY — PX: ESOPHAGOGASTRODUODENOSCOPY (EGD) WITH PROPOFOL: SHX5813

## 2015-06-03 HISTORY — PX: BALLOON DILATION: SHX5330

## 2015-06-03 SURGERY — ESOPHAGOGASTRODUODENOSCOPY (EGD) WITH PROPOFOL
Anesthesia: Monitor Anesthesia Care

## 2015-06-03 MED ORDER — PROPOFOL 10 MG/ML IV BOLUS
INTRAVENOUS | Status: DC | PRN
  Administered 2015-06-03 (×2): 40 mg via INTRAVENOUS

## 2015-06-03 MED ORDER — LACTATED RINGERS IV SOLN: INTRAVENOUS | Status: DC

## 2015-06-03 MED ORDER — SODIUM CHLORIDE 0.9 % IV SOLN: INTRAVENOUS | Status: DC

## 2015-06-03 MED ORDER — PROPOFOL 10 MG/ML IV BOLUS
INTRAVENOUS | Status: AC
  Filled 2015-06-03: qty 20

## 2015-06-03 MED ORDER — LACTATED RINGERS IV SOLN
INTRAVENOUS | Status: DC | PRN
  Administered 2015-06-03: 07:00:00 via INTRAVENOUS

## 2015-06-03 SURGICAL SUPPLY — 14 items

## 2015-06-03 NOTE — Transfer of Care (Signed)
Immediate Anesthesia Transfer of Care Note  Patient: [name]  Procedure(s) Performed: Procedure(s): ESOPHAGOGASTRODUODENOSCOPY (EGD) WITH PROPOFOL ([name]) BALLOON DILATION ([name])  Patient Location: PACU and Endoscopy Unit  Anesthesia Type:MAC  Level of Consciousness: patient cooperative, lethargic and responds to stimulation  Airway & Oxygen Therapy: Patient Spontanous Breathing and Patient connected to nasal cannula oxygen  Post-op Assessment: Report given to RN, Post -op Vital signs reviewed and stable and Patient moving all extremities  Post vital signs: Reviewed and stable  Last Vitals:  Filed Vitals:   06/03/15 0633  BP: 136/72  Pulse: 57  Temp: 36.6 C  Resp: 17    Complications: No apparent anesthesia complications

## 2015-06-03 NOTE — Anesthesia Postprocedure Evaluation (Signed)
  Anesthesia Post-op Note  Patient: [name]  Procedure(s) Performed: Procedure(s) (LRB): ESOPHAGOGASTRODUODENOSCOPY (EGD) WITH PROPOFOL ([name]) BALLOON DILATION ([name])  Patient Location: PACU  Anesthesia Type: MAC  Level of Consciousness: awake and alert   Airway and Oxygen Therapy: Patient Spontanous Breathing  Post-op Pain: mild  Post-op Assessment: Post-op Vital signs reviewed, Patient's Cardiovascular Status Stable, Respiratory Function Stable, Patent Airway and No signs of Nausea or vomiting  Last Vitals:  Filed Vitals:   06/03/15 0810  BP: 149/64  Pulse: 54  Temp:   Resp: 13    Post-op Vital Signs: stable   Complications: No apparent anesthesia complications

## 2015-06-03 NOTE — Discharge Instructions (Addendum)
Esophagogastroduodenoscopy °Esophagogastroduodenoscopy (EGD) is a procedure to examine the lining of the esophagus, stomach, and first part of the small intestine (duodenum). A long, flexible, lighted tube with a camera attached (endoscope) is inserted down the throat to view these organs. This procedure is done to detect problems or abnormalities, such as inflammation, bleeding, ulcers, or growths, in order to treat them. The procedure lasts about 5-20 minutes. It is usually an outpatient procedure, but it may need to be performed in emergency cases in the hospital. °LET YOUR CAREGIVER KNOW ABOUT:  °· Allergies to food or medicine. °· All medicines you are taking, including vitamins, herbs, eyedrops, and over-the-counter medicines and creams. °· Use of steroids (by mouth or creams). °· Previous problems you or members of your family have had with the use of anesthetics. °· Any blood disorders you have. °· Previous surgeries you have had. °· Other health problems you have. °· Possibility of pregnancy, if this applies. °RISKS AND COMPLICATIONS  °Generally, EGD is a safe procedure. However, as with any procedure, complications can occur. Possible complications include: °· Infection. °· Bleeding. °· Tearing (perforation) of the esophagus, stomach, or duodenum. °· Difficulty breathing or not being able to breath. °· Excessive sweating. °· Spasms of the larynx. °· Slowed heartbeat. °· Low blood pressure. °BEFORE THE PROCEDURE °· Do not eat or drink anything for 6-8 hours before the procedure or as directed by your caregiver. °· Ask your caregiver about changing or stopping your regular medicines. °· If you wear dentures, be prepared to remove them before the procedure. °· Arrange for someone to drive you home after the procedure. °PROCEDURE  °· A vein will be accessed to give medicines and fluids. A medicine to relax you (sedative) and a pain reliever will be given through that access into the vein. °· A numbing medicine  (local anesthetic) may be sprayed on your throat for comfort and to stop you from gagging or coughing. °· A mouth guard may be placed in your mouth to protect your teeth and to keep you from biting on the endoscope. °· You will be asked to lie on your left side. °· The endoscope is inserted down your throat and into the esophagus, stomach, and duodenum. °· Air is put through the endoscope to allow your caregiver to view the lining of your esophagus clearly. °· The esophagus, stomach, and duodenum is then examined. During the exam, your caregiver may: °¨ Remove tissue to be examined under a microscope (biopsy) for inflammation, infection, or other medical problems. °¨ Remove growths. °¨ Remove objects (foreign bodies) that are stuck. °¨ Treat any bleeding with medicines or other devices that stop tissues from bleeding (hot cautery, clipping devices). °¨ Widen (dilate) or stretch narrowed areas of the esophagus and stomach. °· The endoscope will then be withdrawn. °AFTER THE PROCEDURE °· You will be taken to a recovery area to be monitored. You will be able to go home once you are stable and alert. °· Do not eat or drink anything until the local anesthetic and numbing medicines have worn off. You may choke. °· It is normal to feel bloated, have pain with swallowing, or have a sore throat for a short time. This will wear off. °· Your caregiver should be able to discuss his or her findings with you. It will take longer to discuss the test results if any biopsies were taken. °Document Released: 12/18/2004 Document Revised: 01/01/2014 Document Reviewed: 07/20/2012 °ExitCare® Patient Information ©2015 [employer]. This information is not   intended to replace advice given to you by your health care provider. Make sure you discuss any questions you have with your health care provider. ° °

## 2015-06-03 NOTE — Op Note (Signed)
Problem: Esophageal dysphagia associated with a benign stricture at the esophagogastric junction  Endoscopist: Earle Gell  Premedication: Propofol administered by anesthesia  Procedure: Diagnostic esophagogastroduodenoscopy with esophageal balloon dilation of the benign distal esophageal stricture The patient was placed in the left lateral decubitus position. The Pentax gastroscope was passed through the posterior hypopharynx into the proximal esophagus without difficulty. The hypopharynx, larynx, and vocal cords appeared normal.  Esophagoscopy: The proximal and mid segments of the esophageal mucosa appeared normal. The squamocolumnar junction was noted at 40 cm from the incisor teeth. There was a benign stricture at the esophagogastric junction located at 40 cm from the incisor teeth and extending less than 1 cm in length. I was easily able to traverse the stricture with Pentax gastroscope. There was no endoscopic evidence for the presence of erosive esophagitis or Barrett's esophagus. Using the esophageal balloon dilator, the stricture was dilated from 15 mm to  16.5 mm without apparent complications.  Gastroscopy: There was a moderate sized hiatal hernia. Retroflexed view of the gastric cardia and fundus was normal. The gastric body, antrum, and pylorus appeared normal.  Duodenoscopy: The duodenal bulb and descending duodenum appeared normal.  Assessment: Chronic gastroesophageal reflux associated with a hiatal hernia and complicated by a benign stricture at the esophagogastric junction dilated from 15 mm to 16.5 mm using the esophageal balloon dilator. Otherwise normal esophagogastroduodenoscopy.  Recommendation: Continue proton pump inhibitor therapy each morning

## 2015-06-03 NOTE — Anesthesia Preprocedure Evaluation (Signed)
Anesthesia Evaluation  Patient identified by MRN, date of birth, ID band Patient awake    Reviewed: Allergy & Precautions, NPO status , Patient's Chart, lab work & pertinent test results  Airway Mallampati: II  TM Distance: >3 FB Neck ROM: Full    Dental no notable dental hx.    Pulmonary former smoker,    Pulmonary exam normal breath sounds clear to auscultation       Cardiovascular negative cardio ROS Normal cardiovascular exam Rhythm:Regular Rate:Normal  ECHO: 08-18-14: Normal EF, diastolic dysfunction, mild aortic stenosis.  ECG: SB 49, First degree AVB   Neuro/Psych PSYCHIATRIC DISORDERS Dementia  CVA    GI/Hepatic Neg liver ROS, GERD  Medicated,  Endo/Other  negative endocrine ROS  Renal/GU negative Renal ROS  negative genitourinary   Musculoskeletal negative musculoskeletal ROS (+)   Abdominal   Peds negative pediatric ROS (+)  Hematology negative hematology ROS (+)   Anesthesia Other Findings   Reproductive/Obstetrics negative OB ROS                             Anesthesia Physical  Anesthesia Plan  ASA: III  Anesthesia Plan: MAC   Post-op Pain Management:    Induction:   Airway Management Planned: Natural Airway  Additional Equipment:   Intra-op Plan:   Post-operative Plan:   Informed Consent: I have reviewed the patients History and Physical, chart, labs and discussed the procedure including the risks, benefits and alternatives for the proposed anesthesia with the patient or authorized representative who has indicated his/her understanding and acceptance.   Dental advisory given  Plan Discussed with: CRNA  Anesthesia Plan Comments:         Anesthesia Quick Evaluation

## 2015-06-03 NOTE — H&P (Signed)
  Problem: Esophageal dysphagia associated with a benign stricture at the esophagogastric junction  History: The patient is an 81 year old male born in 15-Aug-1926. He has chronic gastroesophageal reflux associated with a hiatal hernia and complicated by a benign stricture at the esophagogastric junction. He takes omeprazole each morning and does not experience heartburn. He has redeveloped esophageal dysphagia. He is scheduled to undergo repeat esophagogastroduodenoscopy with benign distal esophageal stricture dilation today.  Past medical history: Hypercholesterolemia. Alzheimer's type dementia. Gastroesophageal reflux. Mild aortic valve stenosis. Remote transient ischemic attack. Prostate cancer. 8 mm left lower lobe pulmonary nodule seen on chest x-ray in December 2015. Bilateral herniorrhaphies. Rotator cuff surgery. Prostate cancer surgery. Traumatic loss of the left third finger.  Exam: The patient is alert and lying comfortably on the endoscopy stretcher. Abdomen is soft and nontender to palpation. Lungs are clear to auscultation. Cardiac exam reveals a regular rhythm.  Plan: Proceed with diagnostic esophagogastroduodenoscopy with benign distal esophageal stricture dilation

## 2015-06-04 ENCOUNTER — Encounter (HOSPITAL_COMMUNITY): Payer: Self-pay | Admitting: Gastroenterology

## 2015-06-05 DIAGNOSIS — Z23 Encounter for immunization: Secondary | ICD-10-CM | POA: Diagnosis not present

## 2015-07-02 DIAGNOSIS — K222 Esophageal obstruction: Secondary | ICD-10-CM | POA: Diagnosis not present

## 2015-07-02 DIAGNOSIS — Z79899 Other long term (current) drug therapy: Secondary | ICD-10-CM | POA: Diagnosis not present

## 2015-07-02 DIAGNOSIS — C61 Malignant neoplasm of prostate: Secondary | ICD-10-CM | POA: Diagnosis not present

## 2015-07-02 DIAGNOSIS — Z0000 Encounter for general adult medical examination without abnormal findings: Secondary | ICD-10-CM | POA: Diagnosis not present

## 2015-07-02 DIAGNOSIS — G301 Alzheimer's disease with late onset: Secondary | ICD-10-CM | POA: Diagnosis not present

## 2015-07-02 DIAGNOSIS — Z1389 Encounter for screening for other disorder: Secondary | ICD-10-CM | POA: Diagnosis not present

## 2015-07-02 DIAGNOSIS — F0280 Dementia in other diseases classified elsewhere without behavioral disturbance: Secondary | ICD-10-CM | POA: Diagnosis not present

## 2015-07-02 DIAGNOSIS — E7800 Pure hypercholesterolemia, unspecified: Secondary | ICD-10-CM | POA: Diagnosis not present

## 2015-07-02 DIAGNOSIS — K219 Gastro-esophageal reflux disease without esophagitis: Secondary | ICD-10-CM | POA: Diagnosis not present

## 2015-07-03 ENCOUNTER — Other Ambulatory Visit (HOSPITAL_COMMUNITY): Payer: Self-pay | Admitting: Geriatric Medicine

## 2015-07-03 DIAGNOSIS — I350 Nonrheumatic aortic (valve) stenosis: Secondary | ICD-10-CM

## 2015-07-10 ENCOUNTER — Other Ambulatory Visit: Payer: Self-pay

## 2015-07-10 ENCOUNTER — Ambulatory Visit (HOSPITAL_COMMUNITY): Payer: Medicare Other | Attending: Cardiology

## 2015-07-10 DIAGNOSIS — I340 Nonrheumatic mitral (valve) insufficiency: Secondary | ICD-10-CM | POA: Insufficient documentation

## 2015-07-10 DIAGNOSIS — I517 Cardiomegaly: Secondary | ICD-10-CM | POA: Diagnosis not present

## 2015-07-10 DIAGNOSIS — Z87891 Personal history of nicotine dependence: Secondary | ICD-10-CM | POA: Diagnosis not present

## 2015-07-10 DIAGNOSIS — I350 Nonrheumatic aortic (valve) stenosis: Secondary | ICD-10-CM

## 2015-07-10 DIAGNOSIS — Z8249 Family history of ischemic heart disease and other diseases of the circulatory system: Secondary | ICD-10-CM | POA: Diagnosis not present

## 2015-07-10 DIAGNOSIS — I352 Nonrheumatic aortic (valve) stenosis with insufficiency: Secondary | ICD-10-CM | POA: Insufficient documentation

## 2015-10-24 DIAGNOSIS — H01001 Unspecified blepharitis right upper eyelid: Secondary | ICD-10-CM | POA: Diagnosis not present

## 2015-10-24 DIAGNOSIS — Z0100 Encounter for examination of eyes and vision without abnormal findings: Secondary | ICD-10-CM | POA: Diagnosis not present

## 2015-10-24 DIAGNOSIS — H01004 Unspecified blepharitis left upper eyelid: Secondary | ICD-10-CM | POA: Diagnosis not present

## 2015-10-24 DIAGNOSIS — Z961 Presence of intraocular lens: Secondary | ICD-10-CM | POA: Diagnosis not present

## 2015-11-19 DIAGNOSIS — C61 Malignant neoplasm of prostate: Secondary | ICD-10-CM | POA: Diagnosis not present

## 2015-11-26 DIAGNOSIS — N400 Enlarged prostate without lower urinary tract symptoms: Secondary | ICD-10-CM | POA: Diagnosis not present

## 2015-11-26 DIAGNOSIS — C61 Malignant neoplasm of prostate: Secondary | ICD-10-CM | POA: Diagnosis not present

## 2015-11-26 DIAGNOSIS — N200 Calculus of kidney: Secondary | ICD-10-CM | POA: Diagnosis not present

## 2015-11-26 DIAGNOSIS — Z0000 Encounter for general adult medical examination without abnormal findings: Secondary | ICD-10-CM | POA: Diagnosis not present

## 2015-12-02 ENCOUNTER — Ambulatory Visit
Admission: RE | Admit: 2015-12-02 | Discharge: 2015-12-02 | Disposition: A | Discharge status: Home / Self Care | Payer: Medicare Other | Source: Ambulatory Visit | Attending: Geriatric Medicine | Admitting: Geriatric Medicine

## 2015-12-02 ENCOUNTER — Other Ambulatory Visit: Payer: Self-pay | Admitting: Geriatric Medicine

## 2015-12-02 DIAGNOSIS — J209 Acute bronchitis, unspecified: Secondary | ICD-10-CM | POA: Diagnosis not present

## 2015-12-02 DIAGNOSIS — R05 Cough: Secondary | ICD-10-CM | POA: Diagnosis not present

## 2015-12-02 DIAGNOSIS — J40 Bronchitis, not specified as acute or chronic: Secondary | ICD-10-CM

## 2015-12-26 DIAGNOSIS — K222 Esophageal obstruction: Secondary | ICD-10-CM | POA: Diagnosis not present

## 2015-12-31 DIAGNOSIS — G301 Alzheimer's disease with late onset: Secondary | ICD-10-CM | POA: Diagnosis not present

## 2015-12-31 DIAGNOSIS — F0280 Dementia in other diseases classified elsewhere without behavioral disturbance: Secondary | ICD-10-CM | POA: Diagnosis not present

## 2015-12-31 DIAGNOSIS — H6122 Impacted cerumen, left ear: Secondary | ICD-10-CM | POA: Diagnosis not present

## 2016-02-25 DIAGNOSIS — R3915 Urgency of urination: Secondary | ICD-10-CM | POA: Diagnosis not present

## 2016-02-25 DIAGNOSIS — R350 Frequency of micturition: Secondary | ICD-10-CM | POA: Diagnosis not present

## 2016-03-31 DIAGNOSIS — L720 Epidermal cyst: Secondary | ICD-10-CM | POA: Diagnosis not present

## 2016-03-31 DIAGNOSIS — L821 Other seborrheic keratosis: Secondary | ICD-10-CM | POA: Diagnosis not present

## 2016-03-31 DIAGNOSIS — L853 Xerosis cutis: Secondary | ICD-10-CM | POA: Diagnosis not present

## 2016-04-16 DIAGNOSIS — F0280 Dementia in other diseases classified elsewhere without behavioral disturbance: Secondary | ICD-10-CM | POA: Diagnosis not present

## 2016-04-16 DIAGNOSIS — K222 Esophageal obstruction: Secondary | ICD-10-CM | POA: Diagnosis not present

## 2016-04-16 DIAGNOSIS — R296 Repeated falls: Secondary | ICD-10-CM | POA: Diagnosis not present

## 2016-04-16 DIAGNOSIS — K219 Gastro-esophageal reflux disease without esophagitis: Secondary | ICD-10-CM | POA: Diagnosis not present

## 2016-04-16 DIAGNOSIS — I350 Nonrheumatic aortic (valve) stenosis: Secondary | ICD-10-CM | POA: Diagnosis not present

## 2016-04-16 DIAGNOSIS — M6281 Muscle weakness (generalized): Secondary | ICD-10-CM | POA: Diagnosis not present

## 2016-04-16 DIAGNOSIS — R269 Unspecified abnormalities of gait and mobility: Secondary | ICD-10-CM | POA: Diagnosis not present

## 2016-04-16 DIAGNOSIS — G301 Alzheimer's disease with late onset: Secondary | ICD-10-CM | POA: Diagnosis not present

## 2016-04-18 DIAGNOSIS — G309 Alzheimer's disease, unspecified: Secondary | ICD-10-CM | POA: Diagnosis not present

## 2016-04-18 DIAGNOSIS — M6281 Muscle weakness (generalized): Secondary | ICD-10-CM | POA: Diagnosis not present

## 2016-04-18 DIAGNOSIS — F0280 Dementia in other diseases classified elsewhere without behavioral disturbance: Secondary | ICD-10-CM | POA: Diagnosis not present

## 2016-04-18 DIAGNOSIS — Z9181 History of falling: Secondary | ICD-10-CM | POA: Diagnosis not present

## 2016-04-18 DIAGNOSIS — R2689 Other abnormalities of gait and mobility: Secondary | ICD-10-CM | POA: Diagnosis not present

## 2016-04-20 DIAGNOSIS — Z9181 History of falling: Secondary | ICD-10-CM | POA: Diagnosis not present

## 2016-04-20 DIAGNOSIS — G309 Alzheimer's disease, unspecified: Secondary | ICD-10-CM | POA: Diagnosis not present

## 2016-04-20 DIAGNOSIS — F0280 Dementia in other diseases classified elsewhere without behavioral disturbance: Secondary | ICD-10-CM | POA: Diagnosis not present

## 2016-04-20 DIAGNOSIS — R2689 Other abnormalities of gait and mobility: Secondary | ICD-10-CM | POA: Diagnosis not present

## 2016-04-20 DIAGNOSIS — M6281 Muscle weakness (generalized): Secondary | ICD-10-CM | POA: Diagnosis not present

## 2016-04-21 DIAGNOSIS — Z79899 Other long term (current) drug therapy: Secondary | ICD-10-CM | POA: Diagnosis not present

## 2016-04-24 DIAGNOSIS — R2689 Other abnormalities of gait and mobility: Secondary | ICD-10-CM | POA: Diagnosis not present

## 2016-04-24 DIAGNOSIS — G309 Alzheimer's disease, unspecified: Secondary | ICD-10-CM | POA: Diagnosis not present

## 2016-04-24 DIAGNOSIS — F0280 Dementia in other diseases classified elsewhere without behavioral disturbance: Secondary | ICD-10-CM | POA: Diagnosis not present

## 2016-04-24 DIAGNOSIS — Z9181 History of falling: Secondary | ICD-10-CM | POA: Diagnosis not present

## 2016-04-24 DIAGNOSIS — M6281 Muscle weakness (generalized): Secondary | ICD-10-CM | POA: Diagnosis not present

## 2016-04-29 DIAGNOSIS — G309 Alzheimer's disease, unspecified: Secondary | ICD-10-CM | POA: Diagnosis not present

## 2016-04-29 DIAGNOSIS — M6281 Muscle weakness (generalized): Secondary | ICD-10-CM | POA: Diagnosis not present

## 2016-04-29 DIAGNOSIS — R2689 Other abnormalities of gait and mobility: Secondary | ICD-10-CM | POA: Diagnosis not present

## 2016-04-29 DIAGNOSIS — Z9181 History of falling: Secondary | ICD-10-CM | POA: Diagnosis not present

## 2016-04-29 DIAGNOSIS — F0280 Dementia in other diseases classified elsewhere without behavioral disturbance: Secondary | ICD-10-CM | POA: Diagnosis not present

## 2016-05-01 DIAGNOSIS — Z9181 History of falling: Secondary | ICD-10-CM | POA: Diagnosis not present

## 2016-05-01 DIAGNOSIS — G309 Alzheimer's disease, unspecified: Secondary | ICD-10-CM | POA: Diagnosis not present

## 2016-05-01 DIAGNOSIS — R2689 Other abnormalities of gait and mobility: Secondary | ICD-10-CM | POA: Diagnosis not present

## 2016-05-01 DIAGNOSIS — F0280 Dementia in other diseases classified elsewhere without behavioral disturbance: Secondary | ICD-10-CM | POA: Diagnosis not present

## 2016-05-01 DIAGNOSIS — M6281 Muscle weakness (generalized): Secondary | ICD-10-CM | POA: Diagnosis not present

## 2016-05-07 DIAGNOSIS — G309 Alzheimer's disease, unspecified: Secondary | ICD-10-CM | POA: Diagnosis not present

## 2016-05-07 DIAGNOSIS — F0280 Dementia in other diseases classified elsewhere without behavioral disturbance: Secondary | ICD-10-CM | POA: Diagnosis not present

## 2016-05-07 DIAGNOSIS — M6281 Muscle weakness (generalized): Secondary | ICD-10-CM | POA: Diagnosis not present

## 2016-05-07 DIAGNOSIS — Z9181 History of falling: Secondary | ICD-10-CM | POA: Diagnosis not present

## 2016-05-07 DIAGNOSIS — R2689 Other abnormalities of gait and mobility: Secondary | ICD-10-CM | POA: Diagnosis not present

## 2016-05-14 DIAGNOSIS — M6281 Muscle weakness (generalized): Secondary | ICD-10-CM | POA: Diagnosis not present

## 2016-05-14 DIAGNOSIS — Z9181 History of falling: Secondary | ICD-10-CM | POA: Diagnosis not present

## 2016-05-14 DIAGNOSIS — F0280 Dementia in other diseases classified elsewhere without behavioral disturbance: Secondary | ICD-10-CM | POA: Diagnosis not present

## 2016-05-14 DIAGNOSIS — R2689 Other abnormalities of gait and mobility: Secondary | ICD-10-CM | POA: Diagnosis not present

## 2016-05-14 DIAGNOSIS — G309 Alzheimer's disease, unspecified: Secondary | ICD-10-CM | POA: Diagnosis not present

## 2016-05-23 DIAGNOSIS — Z23 Encounter for immunization: Secondary | ICD-10-CM | POA: Diagnosis not present

## 2016-06-04 DIAGNOSIS — M6281 Muscle weakness (generalized): Secondary | ICD-10-CM | POA: Diagnosis not present

## 2016-06-04 DIAGNOSIS — I350 Nonrheumatic aortic (valve) stenosis: Secondary | ICD-10-CM | POA: Diagnosis not present

## 2016-06-04 DIAGNOSIS — G301 Alzheimer's disease with late onset: Secondary | ICD-10-CM | POA: Diagnosis not present

## 2016-06-04 DIAGNOSIS — K222 Esophageal obstruction: Secondary | ICD-10-CM | POA: Diagnosis not present

## 2016-06-04 DIAGNOSIS — R269 Unspecified abnormalities of gait and mobility: Secondary | ICD-10-CM | POA: Diagnosis not present

## 2016-06-04 DIAGNOSIS — F0280 Dementia in other diseases classified elsewhere without behavioral disturbance: Secondary | ICD-10-CM | POA: Diagnosis not present

## 2016-06-04 DIAGNOSIS — K219 Gastro-esophageal reflux disease without esophagitis: Secondary | ICD-10-CM | POA: Diagnosis not present

## 2016-06-04 DIAGNOSIS — R296 Repeated falls: Secondary | ICD-10-CM | POA: Diagnosis not present

## 2016-06-08 DIAGNOSIS — D649 Anemia, unspecified: Secondary | ICD-10-CM | POA: Diagnosis not present

## 2016-06-08 DIAGNOSIS — R69 Illness, unspecified: Secondary | ICD-10-CM | POA: Diagnosis not present

## 2016-06-10 DIAGNOSIS — I350 Nonrheumatic aortic (valve) stenosis: Secondary | ICD-10-CM | POA: Diagnosis not present

## 2016-06-10 DIAGNOSIS — F0280 Dementia in other diseases classified elsewhere without behavioral disturbance: Secondary | ICD-10-CM | POA: Diagnosis not present

## 2016-06-10 DIAGNOSIS — R2689 Other abnormalities of gait and mobility: Secondary | ICD-10-CM | POA: Diagnosis not present

## 2016-06-10 DIAGNOSIS — G301 Alzheimer's disease with late onset: Secondary | ICD-10-CM | POA: Diagnosis not present

## 2016-06-11 DIAGNOSIS — R296 Repeated falls: Secondary | ICD-10-CM | POA: Diagnosis not present

## 2016-06-11 DIAGNOSIS — K219 Gastro-esophageal reflux disease without esophagitis: Secondary | ICD-10-CM | POA: Diagnosis not present

## 2016-06-11 DIAGNOSIS — R269 Unspecified abnormalities of gait and mobility: Secondary | ICD-10-CM | POA: Diagnosis not present

## 2016-06-11 DIAGNOSIS — F0281 Dementia in other diseases classified elsewhere with behavioral disturbance: Secondary | ICD-10-CM | POA: Diagnosis not present

## 2016-06-11 DIAGNOSIS — K222 Esophageal obstruction: Secondary | ICD-10-CM | POA: Diagnosis not present

## 2016-06-11 DIAGNOSIS — I350 Nonrheumatic aortic (valve) stenosis: Secondary | ICD-10-CM | POA: Diagnosis not present

## 2016-06-11 DIAGNOSIS — G301 Alzheimer's disease with late onset: Secondary | ICD-10-CM | POA: Diagnosis not present

## 2016-06-12 DIAGNOSIS — F0280 Dementia in other diseases classified elsewhere without behavioral disturbance: Secondary | ICD-10-CM | POA: Diagnosis not present

## 2016-06-12 DIAGNOSIS — I350 Nonrheumatic aortic (valve) stenosis: Secondary | ICD-10-CM | POA: Diagnosis not present

## 2016-06-12 DIAGNOSIS — G301 Alzheimer's disease with late onset: Secondary | ICD-10-CM | POA: Diagnosis not present

## 2016-06-12 DIAGNOSIS — R2689 Other abnormalities of gait and mobility: Secondary | ICD-10-CM | POA: Diagnosis not present

## 2016-06-15 DIAGNOSIS — R2689 Other abnormalities of gait and mobility: Secondary | ICD-10-CM | POA: Diagnosis not present

## 2016-06-15 DIAGNOSIS — F0280 Dementia in other diseases classified elsewhere without behavioral disturbance: Secondary | ICD-10-CM | POA: Diagnosis not present

## 2016-06-15 DIAGNOSIS — I350 Nonrheumatic aortic (valve) stenosis: Secondary | ICD-10-CM | POA: Diagnosis not present

## 2016-06-15 DIAGNOSIS — G301 Alzheimer's disease with late onset: Secondary | ICD-10-CM | POA: Diagnosis not present

## 2016-06-17 ENCOUNTER — Encounter (HOSPITAL_COMMUNITY): Payer: Self-pay | Admitting: Emergency Medicine

## 2016-06-17 ENCOUNTER — Emergency Department (HOSPITAL_COMMUNITY): Payer: Medicare Other

## 2016-06-17 ENCOUNTER — Emergency Department (HOSPITAL_COMMUNITY)
Admission: EM | Admit: 2016-06-17 | Discharge: 2016-06-17 | Disposition: A | Discharge status: Home / Self Care | Payer: Medicare Other | Attending: Emergency Medicine | Admitting: Emergency Medicine

## 2016-06-17 DIAGNOSIS — M542 Cervicalgia: Secondary | ICD-10-CM | POA: Diagnosis not present

## 2016-06-17 DIAGNOSIS — S199XXA Unspecified injury of neck, initial encounter: Secondary | ICD-10-CM | POA: Diagnosis not present

## 2016-06-17 DIAGNOSIS — S0990XA Unspecified injury of head, initial encounter: Secondary | ICD-10-CM | POA: Diagnosis not present

## 2016-06-17 DIAGNOSIS — Y929 Unspecified place or not applicable: Secondary | ICD-10-CM | POA: Insufficient documentation

## 2016-06-17 DIAGNOSIS — R262 Difficulty in walking, not elsewhere classified: Secondary | ICD-10-CM | POA: Diagnosis not present

## 2016-06-17 DIAGNOSIS — S161XXA Strain of muscle, fascia and tendon at neck level, initial encounter: Secondary | ICD-10-CM | POA: Diagnosis not present

## 2016-06-17 DIAGNOSIS — T148XXA Other injury of unspecified body region, initial encounter: Secondary | ICD-10-CM | POA: Diagnosis not present

## 2016-06-17 DIAGNOSIS — Z87891 Personal history of nicotine dependence: Secondary | ICD-10-CM | POA: Insufficient documentation

## 2016-06-17 DIAGNOSIS — Y999 Unspecified external cause status: Secondary | ICD-10-CM | POA: Diagnosis not present

## 2016-06-17 DIAGNOSIS — R2689 Other abnormalities of gait and mobility: Secondary | ICD-10-CM | POA: Diagnosis not present

## 2016-06-17 DIAGNOSIS — W19XXXA Unspecified fall, initial encounter: Secondary | ICD-10-CM | POA: Insufficient documentation

## 2016-06-17 DIAGNOSIS — Z79899 Other long term (current) drug therapy: Secondary | ICD-10-CM | POA: Diagnosis not present

## 2016-06-17 DIAGNOSIS — M79602 Pain in left arm: Secondary | ICD-10-CM | POA: Diagnosis not present

## 2016-06-17 DIAGNOSIS — Y939 Activity, unspecified: Secondary | ICD-10-CM | POA: Insufficient documentation

## 2016-06-17 DIAGNOSIS — R259 Unspecified abnormal involuntary movements: Secondary | ICD-10-CM | POA: Diagnosis not present

## 2016-06-17 DIAGNOSIS — Z8546 Personal history of malignant neoplasm of prostate: Secondary | ICD-10-CM | POA: Insufficient documentation

## 2016-06-17 NOTE — ED Notes (Signed)
[facility] contacted as well as [employer] Spoke with [facility].

## 2016-06-17 NOTE — ED Notes (Signed)
Bed: [facility] Expected date:  Expected time:  Means of arrival:  Comments: 

## 2016-06-17 NOTE — ED Triage Notes (Signed)
Per EMS, pt comes from nursing home d/t an unwitnessed fall. He was found lying in the hallway around 7am. Pt has a history of dementia and is alert and oriented x 4.   Pt was complaining of left arm pain but now only complains of neck pain.    BP: 118/46 HR: 56 RR: 16 98% on RA CBG: 151

## 2016-06-17 NOTE — ED Provider Notes (Signed)
[facility] DEPT Provider Note   CSN: [id number] Arrival date & time: 06/17/16  [id number]     History   Chief Complaint Chief Complaint  Patient presents with  . Fall    HPI [name] is a 82 y.o. male.  Pt presents to the ED today with a fall.  He has dementia and does not remember falling.  Pt c/o his head hurting.  He does not complain of any other pain.      Past Medical History:  Diagnosis Date  . Cancer [facility])    Prostate  . Dementia    Alzheimers  . GERD (gastroesophageal reflux disease)   . Heart murmur    systolic murmur no further eval d/t Alzheimers dx  . Hematuria   . Stroke [facility])    TIA    Patient Active Problem List   Diagnosis Date Noted  . Hematuria 11/07/2014  . Protein-calorie malnutrition, severe ([place]) 08/18/2014  . Diaphoresis 08/17/2014  . Near syncope 08/17/2014  . Sinus bradycardia 08/17/2014  . Dementia 08/17/2014  . Pulmonary nodule 08/17/2014    Past Surgical History:  Procedure Laterality Date  . BALLOON DILATION [name] 07/03/2014   Procedure: BALLOON DILATION;  Surgeon: Garlan Fair, MD;  Location: [facility];  Service: Endoscopy;  Laterality: [name];  . BALLOON DILATION [name] 06/03/2015   Procedure: BALLOON DILATION;  Surgeon: Garlan Fair, MD;  Location: [facility] ENDOSCOPY;  Service: Endoscopy;  Laterality: [name];  . CYSTOSCOPY [name] 11/07/2014   Procedure: CYSTOSCOPY;  Surgeon: Alexis Frock, MD;  Location: [facility] ORS;  Service: Urology;  Laterality: [name];  . ESOPHAGOGASTRODUODENOSCOPY [name] 07/03/2014   Procedure: ESOPHAGOGASTRODUODENOSCOPY (EGD);  Surgeon: Garlan Fair, MD;  Location: [facility];  Service: Endoscopy;  Laterality: [name];  . ESOPHAGOGASTRODUODENOSCOPY (EGD) WITH PROPOFOL [name] 06/03/2015   Procedure: ESOPHAGOGASTRODUODENOSCOPY (EGD) WITH PROPOFOL;  Surgeon: Garlan Fair, MD;  Location: [facility] ENDOS[facility];  Service: Endoscopy;  Laterality: [name];  . HERNIA REPAIR  1970   double  . seed implant for prostate cancer  2006  .  TRANSURETHRAL RESECTION OF PROSTATE [name] 11/07/2014   Procedure: TRANSURETHRAL VAPORIZATION OF THE PROSTATE AND BLADDER LESIONS WITH ERBE (NEW GYRUS) REMOVAL OF BLADDER STONE; PROSTATIC URETHRA BIOPSY;  Surgeon: Alexis Frock, MD;  Location: [facility] ORS;  Service: Urology;  Laterality: [name];       Home Medications    Prior to Admission medications   Medication Sig Start Date End Date Taking? Authorizing Provider  ALPRAZolam Duanne Moron) 0.5 MG tablet Take 0.5 mg by mouth every 6 (six) hours as needed for anxiety (aggitation).   Yes Historical Provider, MD  Calcium-Vitamin D-Vitamin K (VIACTIV) [id number] MG-UNT-MCG CHEW Chew 2 tablets by mouth 2 (two) times daily.   Yes Historical Provider, MD  citalopram (CELEXA) 20 MG tablet Take 20 mg by mouth at bedtime.   Yes Historical Provider, MD  Coenzyme Q10 (CO Q 10) 100 MG CAPS Take 1 capsule by mouth daily.   Yes Historical Provider, MD  cyanocobalamin 1000 MCG tablet Take 1,000 mcg by mouth every morning.    Yes Historical Provider, MD  memantine (NAMENDA) 10 MG tablet Take 10 mg by mouth 2 (two) times daily.    Yes Historical Provider, MD  omeprazole (PRILOSEC) 20 MG capsule Take 20 mg by mouth every morning.    Yes Historical Provider, MD  RED YEAST RICE EXTRACT PO Take 1,200 mg by mouth daily.   Yes Historical Provider, MD    Family History Family History  Problem Relation Age of  Onset  . Heart disease Mother     Social History Social History  Substance Use Topics  . Smoking status: Former Smoker    Packs/day: 1.00    Years: 10.00    Types: Cigarettes    Quit date: 08/31/1968  . Smokeless tobacco: Never Used  . Alcohol use No     Allergies   Review of patient's allergies indicates no known allergies.   Review of Systems Review of Systems  Unable to perform ROS: Dementia     Physical Exam Updated Vital Signs BP 151/64 (BP Location: Right Arm)   Pulse (!) 54   Temp 97.9 F (36.6 C) (Oral)   Resp 18   Ht 5\' 8"  (1.727 m)   Wt  130 lb (59 kg)   SpO2 99%   BMI 19.77 kg/m   Physical Exam  Constitutional: He appears well-developed and well-nourished.  HENT:  Head: Normocephalic and atraumatic.  Right Ear: External ear normal.  Left Ear: External ear normal.  Nose: Nose normal.  Mouth/Throat: Oropharynx is clear and moist.  Eyes: Conjunctivae and EOM are normal. Pupils are equal, round, and reactive to light.  Neck: Normal range of motion. Neck supple.  Cardiovascular: Normal rate, regular rhythm, normal heart sounds and intact distal pulses.   Pulmonary/Chest: Effort normal and breath sounds normal.  Abdominal: Soft. Bowel sounds are normal.  Musculoskeletal: Normal range of motion.  Neurological: He is alert.  Oriented to person  Skin: Skin is warm.  Psychiatric: He has a normal mood and affect. His behavior is normal. Judgment and thought content normal.  Nursing note and vitals reviewed.    ED Treatments / Results  Labs (all labs ordered are listed, but only abnormal results are displayed) Labs Reviewed - No data to display  EKG  EKG Interpretation None       Radiology Ct Head Wo Contrast  Result Date: 06/17/2016 CLINICAL DATA:  Fall, history of dementia, neck pain EXAM: CT HEAD WITHOUT CONTRAST CT CERVICAL SPINE WITHOUT CONTRAST TECHNIQUE: Multidetector CT imaging of the head and cervical spine was performed following the standard protocol without intravenous contrast. Multiplanar CT image reconstructions of the cervical spine were also generated. COMPARISON:  08/07/2014 FINDINGS: CT HEAD FINDINGS Brain: No intracranial hemorrhage, mass effect or midline shift. Stable atrophy and chronic white matter disease. No acute cortical infarction. No mass lesion is noted on this unenhanced scan. Vascular: Atherosclerotic calcifications of carotid siphon. Skull: No skull fracture is noted. Sinuses/Orbits: Paranasal sinuses and mastoid air cells are unremarkable. No intraorbital hematoma. Other: None CT  CERVICAL SPINE FINDINGS Alignment: Normal alignment. Skull base and vertebrae: No acute fracture or subluxation. Degenerative changes are noted C1-C2 articulation. There is bony fusion of C5 and C6 vertebral body. Soft tissues and spinal canal: No prevertebral soft tissue swelling. Spinal canal is patent. Disc levels: Mild disc space flattening with anterior spurring at C4-C5 level. There is significant disc space flattening with endplate sclerotic changes and mild anterior spurring at C6-C7 level. Mild disc space flattening with mild anterior and mild posterior spurring at C7-T1 level. Upper chest: No pneumothorax at in visualized lung apices. Atherosclerotic calcifications are noted bilateral carotid bifurcation. Other: None IMPRESSION: 1. No acute intracranial abnormality. Stable atrophy and chronic white matter disease. 2. No cervical spine acute fracture or subluxation. Multilevel degenerative changes as described above. There is bony fusion of C5-C6 vertebral body. No prevertebral soft tissue swelling. Electronically Signed   By: Lahoma Crocker M.D.   On: 06/17/2016 10:29  Ct Cervical Spine Wo Contrast  Result Date: 06/17/2016 CLINICAL DATA:  Fall, history of dementia, neck pain EXAM: CT HEAD WITHOUT CONTRAST CT CERVICAL SPINE WITHOUT CONTRAST TECHNIQUE: Multidetector CT imaging of the head and cervical spine was performed following the standard protocol without intravenous contrast. Multiplanar CT image reconstructions of the cervical spine were also generated. COMPARISON:  08/07/2014 FINDINGS: CT HEAD FINDINGS Brain: No intracranial hemorrhage, mass effect or midline shift. Stable atrophy and chronic white matter disease. No acute cortical infarction. No mass lesion is noted on this unenhanced scan. Vascular: Atherosclerotic calcifications of carotid siphon. Skull: No skull fracture is noted. Sinuses/Orbits: Paranasal sinuses and mastoid air cells are unremarkable. No intraorbital hematoma. Other: None CT  CERVICAL SPINE FINDINGS Alignment: Normal alignment. Skull base and vertebrae: No acute fracture or subluxation. Degenerative changes are noted C1-C2 articulation. There is bony fusion of C5 and C6 vertebral body. Soft tissues and spinal canal: No prevertebral soft tissue swelling. Spinal canal is patent. Disc levels: Mild disc space flattening with anterior spurring at C4-C5 level. There is significant disc space flattening with endplate sclerotic changes and mild anterior spurring at C6-C7 level. Mild disc space flattening with mild anterior and mild posterior spurring at C7-T1 level. Upper chest: No pneumothorax at in visualized lung apices. Atherosclerotic calcifications are noted bilateral carotid bifurcation. Other: None IMPRESSION: 1. No acute intracranial abnormality. Stable atrophy and chronic white matter disease. 2. No cervical spine acute fracture or subluxation. Multilevel degenerative changes as described above. There is bony fusion of C5-C6 vertebral body. No prevertebral soft tissue swelling. Electronically Signed   By: Lahoma Crocker M.D.   On: 06/17/2016 10:29    Procedures Procedures (including critical care time)  Medications Ordered in ED Medications - No data to display   Initial Impression / Assessment and Plan / ED Course  I have reviewed the triage vital signs and the nursing notes.  Pertinent labs & imaging results that were available during my care of the patient were reviewed by me and considered in my medical decision making (see chart for details).  Clinical Course      Final Clinical Impressions(s) / ED Diagnoses   Final diagnoses:  Fall, initial encounter  Minor head injury, initial encounter  Cervical strain, acute, initial encounter  Ambulatory dysfunction    New Prescriptions New Prescriptions   No medications on file     Isla Pence, MD 06/17/16 1044

## 2016-06-17 NOTE — ED Notes (Signed)
Patient transported to CT 

## 2016-06-17 NOTE — ED Notes (Signed)
Bed: WA03 Expected date:  Expected time:  Means of arrival:  Comments: EMS 

## 2016-06-18 ENCOUNTER — Emergency Department (HOSPITAL_COMMUNITY): Payer: Medicare Other

## 2016-06-18 ENCOUNTER — Encounter (HOSPITAL_COMMUNITY): Payer: Self-pay | Admitting: Emergency Medicine

## 2016-06-18 ENCOUNTER — Emergency Department (HOSPITAL_COMMUNITY)
Admission: EM | Admit: 2016-06-18 | Discharge: 2016-06-19 | Disposition: A | Discharge status: Home / Self Care | Payer: Medicare Other | Attending: Emergency Medicine | Admitting: Emergency Medicine

## 2016-06-18 DIAGNOSIS — F918 Other conduct disorders: Secondary | ICD-10-CM | POA: Diagnosis present

## 2016-06-18 DIAGNOSIS — S60511A Abrasion of right hand, initial encounter: Secondary | ICD-10-CM | POA: Insufficient documentation

## 2016-06-18 DIAGNOSIS — K219 Gastro-esophageal reflux disease without esophagitis: Secondary | ICD-10-CM | POA: Diagnosis not present

## 2016-06-18 DIAGNOSIS — X58XXXA Exposure to other specified factors, initial encounter: Secondary | ICD-10-CM | POA: Insufficient documentation

## 2016-06-18 DIAGNOSIS — I350 Nonrheumatic aortic (valve) stenosis: Secondary | ICD-10-CM | POA: Diagnosis not present

## 2016-06-18 DIAGNOSIS — Z8546 Personal history of malignant neoplasm of prostate: Secondary | ICD-10-CM | POA: Diagnosis not present

## 2016-06-18 DIAGNOSIS — Y939 Activity, unspecified: Secondary | ICD-10-CM | POA: Insufficient documentation

## 2016-06-18 DIAGNOSIS — Z79899 Other long term (current) drug therapy: Secondary | ICD-10-CM | POA: Diagnosis not present

## 2016-06-18 DIAGNOSIS — F0281 Dementia in other diseases classified elsewhere with behavioral disturbance: Secondary | ICD-10-CM | POA: Insufficient documentation

## 2016-06-18 DIAGNOSIS — F0280 Dementia in other diseases classified elsewhere without behavioral disturbance: Secondary | ICD-10-CM | POA: Diagnosis not present

## 2016-06-18 DIAGNOSIS — Y999 Unspecified external cause status: Secondary | ICD-10-CM | POA: Diagnosis not present

## 2016-06-18 DIAGNOSIS — R2689 Other abnormalities of gait and mobility: Secondary | ICD-10-CM | POA: Diagnosis not present

## 2016-06-18 DIAGNOSIS — Z87891 Personal history of nicotine dependence: Secondary | ICD-10-CM | POA: Insufficient documentation

## 2016-06-18 DIAGNOSIS — Y92129 Unspecified place in nursing home as the place of occurrence of the external cause: Secondary | ICD-10-CM | POA: Insufficient documentation

## 2016-06-18 DIAGNOSIS — R4182 Altered mental status, unspecified: Secondary | ICD-10-CM | POA: Diagnosis not present

## 2016-06-18 DIAGNOSIS — G309 Alzheimer's disease, unspecified: Secondary | ICD-10-CM | POA: Insufficient documentation

## 2016-06-18 DIAGNOSIS — R269 Unspecified abnormalities of gait and mobility: Secondary | ICD-10-CM | POA: Diagnosis not present

## 2016-06-18 DIAGNOSIS — K222 Esophageal obstruction: Secondary | ICD-10-CM | POA: Diagnosis not present

## 2016-06-18 DIAGNOSIS — R296 Repeated falls: Secondary | ICD-10-CM | POA: Diagnosis not present

## 2016-06-18 DIAGNOSIS — G301 Alzheimer's disease with late onset: Secondary | ICD-10-CM | POA: Diagnosis not present

## 2016-06-18 LAB — CBC WITH DIFFERENTIAL/PLATELET
BASOS ABS: 0 10*3/uL (ref 0.0–0.1)
BASOS PCT: 1 %
EOS ABS: 0.1 10*3/uL (ref 0.0–0.7)
Eosinophils Relative: 1 %
HEMATOCRIT: 43.7 % (ref 39.0–52.0)
HEMOGLOBIN: 15 g/dL (ref 13.0–17.0)
Lymphocytes Relative: 14 %
Lymphs Abs: 1 10*3/uL (ref 0.7–4.0)
MCH: 30.3 pg (ref 26.0–34.0)
MCHC: 34.3 g/dL (ref 30.0–36.0)
MCV: 88.3 fL (ref 78.0–100.0)
Monocytes Absolute: 0.7 10*3/uL (ref 0.1–1.0)
Monocytes Relative: 10 %
NEUTROS ABS: 4.9 10*3/uL (ref 1.7–7.7)
NEUTROS PCT: 74 %
Platelets: 203 10*3/uL (ref 150–400)
RBC: 4.95 MIL/uL (ref 4.22–5.81)
RDW: 13.3 % (ref 11.5–15.5)
WBC: 6.6 10*3/uL (ref 4.0–10.5)

## 2016-06-18 LAB — COMPREHENSIVE METABOLIC PANEL
ALBUMIN: 4.2 g/dL (ref 3.5–5.0)
ALK PHOS: 70 U/L (ref 38–126)
ALT: 15 U/L — ABNORMAL LOW (ref 17–63)
ANION GAP: 8 (ref 5–15)
AST: 28 U/L (ref 15–41)
BILIRUBIN TOTAL: 0.8 mg/dL (ref 0.3–1.2)
BUN: 14 mg/dL (ref 6–20)
CALCIUM: 9.4 mg/dL (ref 8.9–10.3)
CO2: 26 mmol/L (ref 22–32)
Chloride: 107 mmol/L (ref 101–111)
Creatinine, Ser: 0.97 mg/dL (ref 0.61–1.24)
GLUCOSE: 109 mg/dL — AB (ref 65–99)
POTASSIUM: 3.3 mmol/L — AB (ref 3.5–5.1)
Sodium: 141 mmol/L (ref 135–145)
TOTAL PROTEIN: 7.4 g/dL (ref 6.5–8.1)

## 2016-06-18 LAB — ETHANOL

## 2016-06-18 MED ORDER — LIDOCAINE HCL 2 % EX GEL
1.0000 "application " | Freq: Once | CUTANEOUS | Status: AC
  Administered 2016-06-19: 1 via URETHRAL
  Filled 2016-06-18: qty 11

## 2016-06-18 NOTE — ED Notes (Signed)
Asked for urine twice, pt can't right now
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
## 2016-06-18 NOTE — ED Notes (Signed)
Wife came to visit pt and spoke with [employer]. [employer] reports the facility told the wife to take out IVC papers for them, but wife does not want to do this.
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
## 2016-06-18 NOTE — ED Notes (Signed)
Bed: [facility] Expected date:  Expected time:  Means of arrival:  Comments: 

## 2016-06-18 NOTE — ED Provider Notes (Signed)
[facility] DEPT Provider Note   CSN: [id number] Arrival date & time: 06/18/16  1742     History   Chief Complaint No chief complaint on file.   HPI [name] is a 82 y.o. male.  HPI evel 5 caveat due to dementia. Patient was brought in for aggressiveness. Reportedly his been aggressive towards staff. Patient has a history of dementia and cannot provide to him much history. States that he was trying to keep people from going into a room. He cannot tell me why. Presents with police. Police tell me that the facility was going to IVC him but it was the end of the staff members shift so she wanted the patient's wife to do it. Patient's wife reportedly cannot do this because she does not drive at night. He was seen in the ER yesterday after fall and had negative head CT at that time. He is cooperative at this time. Past Medical History:  Diagnosis Date  . Cancer [facility])    Prostate  . Dementia    Alzheimers  . GERD (gastroesophageal reflux disease)   . Heart murmur    systolic murmur no further eval d/t Alzheimers dx  . Hematuria   . Stroke [facility])    TIA    Patient Active Problem List   Diagnosis Date Noted  . Hematuria 11/07/2014  . Protein-calorie malnutrition, severe ([place]) 08/18/2014  . Diaphoresis 08/17/2014  . Near syncope 08/17/2014  . Sinus bradycardia 08/17/2014  . Dementia 08/17/2014  . Pulmonary nodule 08/17/2014    Past Surgical History:  Procedure Laterality Date  . BALLOON DILATION [name] 07/03/2014   Procedure: BALLOON DILATION;  Surgeon: Garlan Fair, MD;  Location: [facility];  Service: Endoscopy;  Laterality: [name];  . BALLOON DILATION [name] 06/03/2015   Procedure: BALLOON DILATION;  Surgeon: Garlan Fair, MD;  Location: [facility] ENDOSCOPY;  Service: Endoscopy;  Laterality: [name];  . CYSTOSCOPY [name] 11/07/2014   Procedure: CYSTOSCOPY;  Surgeon: Alexis Frock, MD;  Location: [facility] ORS;  Service: Urology;  Laterality: [name];  . ESOPHAGOGASTRODUODENOSCOPY [name]  07/03/2014   Procedure: ESOPHAGOGASTRODUODENOSCOPY (EGD);  Surgeon: Garlan Fair, MD;  Location: [facility];  Service: Endoscopy;  Laterality: [name];  . ESOPHAGOGASTRODUODENOSCOPY (EGD) WITH PROPOFOL [name] 06/03/2015   Procedure: ESOPHAGOGASTRODUODENOSCOPY (EGD) WITH PROPOFOL;  Surgeon: Garlan Fair, MD;  Location: [facility] ENDOSCOPY;  Service: Endoscopy;  Laterality: [name];  . HERNIA REPAIR  1970   double  . seed implant for prostate cancer  2006  . TRANSURETHRAL RESECTION OF PROSTATE [name] 11/07/2014   Procedure: TRANSURETHRAL VAPORIZATION OF THE PROSTATE AND BLADDER LESIONS WITH ERBE (NEW GYRUS) REMOVAL OF BLADDER STONE; PROSTATIC URETHRA BIOPSY;  Surgeon: Alexis Frock, MD;  Location: [facility] ORS;  Service: Urology;  Laterality: [name];       Home Medications    Prior to Admission medications   Medication Sig Start Date End Date Taking? Authorizing Provider  ALPRAZolam Duanne Moron) 0.5 MG tablet Take 0.5 mg by mouth every 6 (six) hours as needed for anxiety (aggitation).   Yes Historical Provider, MD  Calcium-Vitamin D-Vitamin K (VIACTIV) [id number] MG-UNT-MCG CHEW Chew 2 tablets by mouth 2 (two) times daily.   Yes Historical Provider, MD  citalopram (CELEXA) 20 MG tablet Take 20 mg by mouth at bedtime.   Yes Historical Provider, MD  Coenzyme Q10 (CO Q 10) 100 MG CAPS Take 1 capsule by mouth daily.   Yes Historical Provider, MD  cyanocobalamin 1000 MCG tablet Take 1,000 mcg by mouth every morning.  Yes Historical Provider, MD  memantine (NAMENDA) 10 MG tablet Take 10 mg by mouth 2 (two) times daily.    Yes Historical Provider, MD  omeprazole (PRILOSEC) 20 MG capsule Take 20 mg by mouth every morning.    Yes Historical Provider, MD  RED YEAST RICE EXTRACT PO Take 1,200 mg by mouth daily.   Yes Historical Provider, MD    Family History Family History  Problem Relation Age of Onset  . Heart disease Mother     Social History Social History  Substance Use Topics  . Smoking status: Former Smoker     Packs/day: 1.00    Years: 10.00    Types: Cigarettes    Quit date: 08/31/1968  . Smokeless tobacco: Never Used  . Alcohol use No     Allergies   Review of patient's allergies indicates no known allergies.   Review of Systems Review of Systems  Unable to perform ROS: Dementia     Physical Exam Updated Vital Signs BP 140/70 (BP Location: Left Arm)   Pulse 60   Temp 98.1 F (36.7 C) (Oral)   Resp 19   SpO2 100%   Physical Exam  Constitutional: He appears well-developed.  HENT:  Head: Atraumatic.  Eyes: Pupils are equal, round, and reactive to light.  Cardiovascular: Normal rate.   Pulmonary/Chest: Effort normal.  Abdominal: Soft. There is no tenderness.  Musculoskeletal:  Abrasion over dorsum of right hand without underlying tenderness.  Neurological: He is alert.  Patient with confusion. Not aggressive at this time.   Skin: Skin is warm.  Psychiatric: He has a normal mood and affect.     ED Treatments / Results  Labs (all labs ordered are listed, but only abnormal results are displayed) Labs Reviewed  COMPREHENSIVE METABOLIC PANEL - Abnormal; Notable for the following:       Result Value   Potassium 3.3 (*)    Glucose, Bld 109 (*)    ALT 15 (*)    All other components within normal limits  ETHANOL  CBC WITH DIFFERENTIAL/PLATELET  URINALYSIS, ROUTINE W REFLEX MICROSCOPIC (NOT AT [facility])  RAPID URINE DRUG SCREEN, HOSP PERFORMED    EKG  EKG Interpretation  Date/Time:  Thursday June 18 2016 19:15:00 EDT Ventricular Rate:  60 PR Interval:    QRS Duration: 88 QT Interval:  424 QTC Calculation: 424 R Axis:   45 Text Interpretation:  Sinus rhythm Baseline wander in lead(s) V3 Confirmed by Alvino Chapel  MD, Ovid Curd [phone]) on 06/18/2016 7:45:34 PM       Radiology Ct Head Wo Contrast  Result Date: 06/17/2016 CLINICAL DATA:  Fall, history of dementia, neck pain EXAM: CT HEAD WITHOUT CONTRAST CT CERVICAL SPINE WITHOUT CONTRAST TECHNIQUE: Multidetector CT  imaging of the head and cervical spine was performed following the standard protocol without intravenous contrast. Multiplanar CT image reconstructions of the cervical spine were also generated. COMPARISON:  08/07/2014 FINDINGS: CT HEAD FINDINGS Brain: No intracranial hemorrhage, mass effect or midline shift. Stable atrophy and chronic white matter disease. No acute cortical infarction. No mass lesion is noted on this unenhanced scan. Vascular: Atherosclerotic calcifications of carotid siphon. Skull: No skull fracture is noted. Sinuses/Orbits: Paranasal sinuses and mastoid air cells are unremarkable. No intraorbital hematoma. Other: None CT CERVICAL SPINE FINDINGS Alignment: Normal alignment. Skull base and vertebrae: No acute fracture or subluxation. Degenerative changes are noted C1-C2 articulation. There is bony fusion of C5 and C6 vertebral body. Soft tissues and spinal canal: No prevertebral soft tissue swelling. Spinal canal  is patent. Disc levels: Mild disc space flattening with anterior spurring at C4-C5 level. There is significant disc space flattening with endplate sclerotic changes and mild anterior spurring at C6-C7 level. Mild disc space flattening with mild anterior and mild posterior spurring at C7-T1 level. Upper chest: No pneumothorax at in visualized lung apices. Atherosclerotic calcifications are noted bilateral carotid bifurcation. Other: None IMPRESSION: 1. No acute intracranial abnormality. Stable atrophy and chronic white matter disease. 2. No cervical spine acute fracture or subluxation. Multilevel degenerative changes as described above. There is bony fusion of C5-C6 vertebral body. No prevertebral soft tissue swelling. Electronically Signed   By: Lahoma Crocker M.D.   On: 06/17/2016 10:29   Ct Cervical Spine Wo Contrast  Result Date: 06/17/2016 CLINICAL DATA:  Fall, history of dementia, neck pain EXAM: CT HEAD WITHOUT CONTRAST CT CERVICAL SPINE WITHOUT CONTRAST TECHNIQUE: Multidetector CT  imaging of the head and cervical spine was performed following the standard protocol without intravenous contrast. Multiplanar CT image reconstructions of the cervical spine were also generated. COMPARISON:  08/07/2014 FINDINGS: CT HEAD FINDINGS Brain: No intracranial hemorrhage, mass effect or midline shift. Stable atrophy and chronic white matter disease. No acute cortical infarction. No mass lesion is noted on this unenhanced scan. Vascular: Atherosclerotic calcifications of carotid siphon. Skull: No skull fracture is noted. Sinuses/Orbits: Paranasal sinuses and mastoid air cells are unremarkable. No intraorbital hematoma. Other: None CT CERVICAL SPINE FINDINGS Alignment: Normal alignment. Skull base and vertebrae: No acute fracture or subluxation. Degenerative changes are noted C1-C2 articulation. There is bony fusion of C5 and C6 vertebral body. Soft tissues and spinal canal: No prevertebral soft tissue swelling. Spinal canal is patent. Disc levels: Mild disc space flattening with anterior spurring at C4-C5 level. There is significant disc space flattening with endplate sclerotic changes and mild anterior spurring at C6-C7 level. Mild disc space flattening with mild anterior and mild posterior spurring at C7-T1 level. Upper chest: No pneumothorax at in visualized lung apices. Atherosclerotic calcifications are noted bilateral carotid bifurcation. Other: None IMPRESSION: 1. No acute intracranial abnormality. Stable atrophy and chronic white matter disease. 2. No cervical spine acute fracture or subluxation. Multilevel degenerative changes as described above. There is bony fusion of C5-C6 vertebral body. No prevertebral soft tissue swelling. Electronically Signed   By: Lahoma Crocker M.D.   On: 06/17/2016 10:29   Dg Chest Portable 1 View  Result Date: 06/18/2016 CLINICAL DATA:  Acute onset of altered mental status. Initial encounter. EXAM: PORTABLE CHEST 1 VIEW COMPARISON:  Chest radiograph performed 12/02/2015  FINDINGS: The lungs are well-aerated. There is suggestion of an 8 mm nodule at the left midlung zone. Peribronchial thickening is seen. There is no evidence of pleural effusion or pneumothorax. The cardiomediastinal silhouette is within normal limits. No acute osseous abnormalities are seen. IMPRESSION: 1. Peribronchial thickening noted.  Lungs otherwise grossly clear. 2. **An incidental finding of potential clinical significance has been found. Suggestion of 8 mm nodule at the left midlung zone. CT of the chest is recommended for further evaluation, on an elective nonemergent basis.** Electronically Signed   By: Garald Balding M.D.   On: 06/18/2016 20:31    Procedures Procedures (including critical care time)  Medications Ordered in ED Medications  lidocaine (XYLOCAINE) 2 % jelly 1 application (1 application Urethral Given 06/19/16 0021)     Initial Impression / Assessment and Plan / ED Course  I have reviewed the triage vital signs and the nursing notes.  Pertinent labs & imaging results that were  available during my care of the patient were reviewed by me and considered in my medical decision making (see chart for details).  Clinical Course    Patient brought in after being violent at nursing home. Reportedly was going to be involuntary committed by staff but they were finishing her shift so they did not do it. Wife would IVC but she does not drive at night. Urine pending but otherwise medically cleared. He has not been combative here.  Final Clinical Impressions(s) / ED Diagnoses   Final diagnoses:  Alzheimer's dementia with behavioral disturbance, unspecified timing of dementia onset    New Prescriptions New Prescriptions   No medications on file     Davonna Belling, MD 06/19/16 0110

## 2016-06-19 DIAGNOSIS — R4182 Altered mental status, unspecified: Secondary | ICD-10-CM | POA: Diagnosis not present

## 2016-06-19 DIAGNOSIS — R531 Weakness: Secondary | ICD-10-CM | POA: Diagnosis not present

## 2016-06-19 DIAGNOSIS — G309 Alzheimer's disease, unspecified: Secondary | ICD-10-CM | POA: Diagnosis not present

## 2016-06-19 LAB — URINALYSIS, ROUTINE W REFLEX MICROSCOPIC
Bilirubin Urine: NEGATIVE
Glucose, UA: NEGATIVE mg/dL
Ketones, ur: 15 mg/dL — AB
Nitrite: NEGATIVE
Protein, ur: NEGATIVE mg/dL
Specific Gravity, Urine: 1.019 (ref 1.005–1.030)
pH: 7 (ref 5.0–8.0)

## 2016-06-19 LAB — RAPID URINE DRUG SCREEN, HOSP PERFORMED
Amphetamines: NOT DETECTED
Barbiturates: NOT DETECTED
Benzodiazepines: NOT DETECTED
Cocaine: NOT DETECTED
Opiates: NOT DETECTED
Tetrahydrocannabinol: NOT DETECTED

## 2016-06-19 LAB — URINE MICROSCOPIC-ADD ON

## 2016-06-19 NOTE — ED Provider Notes (Signed)
2:14 AM Patient signed out as pending UA for complete evaluation of aggressive behavior.  I performed in and out cath which did not show any UTI.  Culture sent.  Patient has had no aggressive behavior in the ED throughout his stay. He will be sent back to his facility.   Everlene Balls, MD 06/19/16 [phone]

## 2016-06-20 LAB — URINE CULTURE: CULTURE: NO GROWTH

## 2016-06-23 DIAGNOSIS — R2689 Other abnormalities of gait and mobility: Secondary | ICD-10-CM | POA: Diagnosis not present

## 2016-06-23 DIAGNOSIS — I350 Nonrheumatic aortic (valve) stenosis: Secondary | ICD-10-CM | POA: Diagnosis not present

## 2016-06-23 DIAGNOSIS — G301 Alzheimer's disease with late onset: Secondary | ICD-10-CM | POA: Diagnosis not present

## 2016-06-23 DIAGNOSIS — F0280 Dementia in other diseases classified elsewhere without behavioral disturbance: Secondary | ICD-10-CM | POA: Diagnosis not present

## 2016-07-01 DIAGNOSIS — I350 Nonrheumatic aortic (valve) stenosis: Secondary | ICD-10-CM | POA: Diagnosis not present

## 2016-07-01 DIAGNOSIS — F0280 Dementia in other diseases classified elsewhere without behavioral disturbance: Secondary | ICD-10-CM | POA: Diagnosis not present

## 2016-07-01 DIAGNOSIS — G301 Alzheimer's disease with late onset: Secondary | ICD-10-CM | POA: Diagnosis not present

## 2016-07-01 DIAGNOSIS — R2689 Other abnormalities of gait and mobility: Secondary | ICD-10-CM | POA: Diagnosis not present

## 2016-07-06 DIAGNOSIS — F0280 Dementia in other diseases classified elsewhere without behavioral disturbance: Secondary | ICD-10-CM | POA: Diagnosis not present

## 2016-07-06 DIAGNOSIS — R2689 Other abnormalities of gait and mobility: Secondary | ICD-10-CM | POA: Diagnosis not present

## 2016-07-06 DIAGNOSIS — G301 Alzheimer's disease with late onset: Secondary | ICD-10-CM | POA: Diagnosis not present

## 2016-07-06 DIAGNOSIS — I350 Nonrheumatic aortic (valve) stenosis: Secondary | ICD-10-CM | POA: Diagnosis not present

## 2016-07-07 DIAGNOSIS — G301 Alzheimer's disease with late onset: Secondary | ICD-10-CM | POA: Diagnosis not present

## 2016-07-07 DIAGNOSIS — R2689 Other abnormalities of gait and mobility: Secondary | ICD-10-CM | POA: Diagnosis not present

## 2016-07-07 DIAGNOSIS — I350 Nonrheumatic aortic (valve) stenosis: Secondary | ICD-10-CM | POA: Diagnosis not present

## 2016-07-07 DIAGNOSIS — F0280 Dementia in other diseases classified elsewhere without behavioral disturbance: Secondary | ICD-10-CM | POA: Diagnosis not present

## 2016-07-13 DIAGNOSIS — R2689 Other abnormalities of gait and mobility: Secondary | ICD-10-CM | POA: Diagnosis not present

## 2016-07-13 DIAGNOSIS — I350 Nonrheumatic aortic (valve) stenosis: Secondary | ICD-10-CM | POA: Diagnosis not present

## 2016-07-13 DIAGNOSIS — G301 Alzheimer's disease with late onset: Secondary | ICD-10-CM | POA: Diagnosis not present

## 2016-07-13 DIAGNOSIS — F0280 Dementia in other diseases classified elsewhere without behavioral disturbance: Secondary | ICD-10-CM | POA: Diagnosis not present

## 2016-07-16 DIAGNOSIS — K222 Esophageal obstruction: Secondary | ICD-10-CM | POA: Diagnosis not present

## 2016-07-16 DIAGNOSIS — I350 Nonrheumatic aortic (valve) stenosis: Secondary | ICD-10-CM | POA: Diagnosis not present

## 2016-07-16 DIAGNOSIS — K219 Gastro-esophageal reflux disease without esophagitis: Secondary | ICD-10-CM | POA: Diagnosis not present

## 2016-07-16 DIAGNOSIS — M6281 Muscle weakness (generalized): Secondary | ICD-10-CM | POA: Diagnosis not present

## 2016-07-16 DIAGNOSIS — R296 Repeated falls: Secondary | ICD-10-CM | POA: Diagnosis not present

## 2016-07-16 DIAGNOSIS — G301 Alzheimer's disease with late onset: Secondary | ICD-10-CM | POA: Diagnosis not present

## 2016-07-16 DIAGNOSIS — R269 Unspecified abnormalities of gait and mobility: Secondary | ICD-10-CM | POA: Diagnosis not present

## 2016-07-18 DIAGNOSIS — G301 Alzheimer's disease with late onset: Secondary | ICD-10-CM | POA: Diagnosis not present

## 2016-07-18 DIAGNOSIS — F0280 Dementia in other diseases classified elsewhere without behavioral disturbance: Secondary | ICD-10-CM | POA: Diagnosis not present

## 2016-07-18 DIAGNOSIS — I350 Nonrheumatic aortic (valve) stenosis: Secondary | ICD-10-CM | POA: Diagnosis not present

## 2016-07-18 DIAGNOSIS — R2689 Other abnormalities of gait and mobility: Secondary | ICD-10-CM | POA: Diagnosis not present

## 2016-07-20 DIAGNOSIS — F0280 Dementia in other diseases classified elsewhere without behavioral disturbance: Secondary | ICD-10-CM | POA: Diagnosis not present

## 2016-07-20 DIAGNOSIS — G301 Alzheimer's disease with late onset: Secondary | ICD-10-CM | POA: Diagnosis not present

## 2016-07-20 DIAGNOSIS — R2689 Other abnormalities of gait and mobility: Secondary | ICD-10-CM | POA: Diagnosis not present

## 2016-07-20 DIAGNOSIS — I350 Nonrheumatic aortic (valve) stenosis: Secondary | ICD-10-CM | POA: Diagnosis not present

## 2016-07-22 ENCOUNTER — Emergency Department (HOSPITAL_COMMUNITY)
Admission: EM | Admit: 2016-07-22 | Discharge: 2016-07-22 | Disposition: A | Discharge status: Home / Self Care | Payer: Medicare Other | Attending: Emergency Medicine | Admitting: Emergency Medicine

## 2016-07-22 ENCOUNTER — Emergency Department (HOSPITAL_COMMUNITY): Payer: Medicare Other

## 2016-07-22 ENCOUNTER — Encounter (HOSPITAL_COMMUNITY): Payer: Self-pay | Admitting: Emergency Medicine

## 2016-07-22 DIAGNOSIS — Z8673 Personal history of transient ischemic attack (TIA), and cerebral infarction without residual deficits: Secondary | ICD-10-CM | POA: Diagnosis not present

## 2016-07-22 DIAGNOSIS — M25551 Pain in right hip: Secondary | ICD-10-CM | POA: Diagnosis not present

## 2016-07-22 DIAGNOSIS — M25552 Pain in left hip: Secondary | ICD-10-CM | POA: Diagnosis not present

## 2016-07-22 DIAGNOSIS — W19XXXA Unspecified fall, initial encounter: Secondary | ICD-10-CM | POA: Insufficient documentation

## 2016-07-22 DIAGNOSIS — F0280 Dementia in other diseases classified elsewhere without behavioral disturbance: Secondary | ICD-10-CM | POA: Diagnosis not present

## 2016-07-22 DIAGNOSIS — S0990XA Unspecified injury of head, initial encounter: Secondary | ICD-10-CM | POA: Diagnosis not present

## 2016-07-22 DIAGNOSIS — S199XXA Unspecified injury of neck, initial encounter: Secondary | ICD-10-CM | POA: Diagnosis not present

## 2016-07-22 DIAGNOSIS — S79911A Unspecified injury of right hip, initial encounter: Secondary | ICD-10-CM | POA: Diagnosis not present

## 2016-07-22 DIAGNOSIS — Z87891 Personal history of nicotine dependence: Secondary | ICD-10-CM | POA: Diagnosis not present

## 2016-07-22 DIAGNOSIS — G309 Alzheimer's disease, unspecified: Secondary | ICD-10-CM | POA: Diagnosis not present

## 2016-07-22 DIAGNOSIS — Y939 Activity, unspecified: Secondary | ICD-10-CM | POA: Insufficient documentation

## 2016-07-22 DIAGNOSIS — S098XXA Other specified injuries of head, initial encounter: Secondary | ICD-10-CM | POA: Diagnosis not present

## 2016-07-22 DIAGNOSIS — Y929 Unspecified place or not applicable: Secondary | ICD-10-CM | POA: Insufficient documentation

## 2016-07-22 DIAGNOSIS — S79912A Unspecified injury of left hip, initial encounter: Secondary | ICD-10-CM | POA: Diagnosis not present

## 2016-07-22 DIAGNOSIS — Z043 Encounter for examination and observation following other accident: Secondary | ICD-10-CM | POA: Insufficient documentation

## 2016-07-22 DIAGNOSIS — Y999 Unspecified external cause status: Secondary | ICD-10-CM | POA: Diagnosis not present

## 2016-07-22 DIAGNOSIS — Z8546 Personal history of malignant neoplasm of prostate: Secondary | ICD-10-CM | POA: Insufficient documentation

## 2016-07-22 DIAGNOSIS — R51 Headache: Secondary | ICD-10-CM | POA: Diagnosis not present

## 2016-07-22 DIAGNOSIS — G4489 Other headache syndrome: Secondary | ICD-10-CM | POA: Diagnosis not present

## 2016-07-22 NOTE — ED Notes (Addendum)
Discharge instructions and follow up care reviewed with patient's family member. Family member states they will give the discharge instructions to the facility. Family member states they will take patient back to [facility]. Spoke with staff at facility to give report.

## 2016-07-22 NOTE — ED Notes (Signed)
Patient transported to X-ray 

## 2016-07-22 NOTE — ED Provider Notes (Signed)
[place] DEPT Provider Note   CSN: [id number] Arrival date & time: 07/22/16  1111     History   Chief Complaint Chief Complaint  Patient presents with  . Fall    HPI [name] is a 82 y.o. male.  The history is provided by the patient and medical records. The history is limited by the condition of the patient. No language interpreter was used.  Fall    [name] is a 82 y.o. male  with a PMH of dementia, prior TIA, heart murmur, prostate cancer who presents to the Emergency Department from nursing facility for evaluation after a mechanical fall just prior to arrival. Patient is able to tell me that he had a fall, but unable to inform me of details. Per facility this was a mechanical fall. He denies hip pain, however he did tell EMS that his bilateral hips hurt after fall. He tells me that he did have left hip pain which has now resolved. No complaints at this time.    Level V caveat applies due to dementia.  Past Medical History:  Diagnosis Date  . Cancer [facility])    Prostate  . Dementia    Alzheimers  . GERD (gastroesophageal reflux disease)   . Heart murmur    systolic murmur no further eval d/t Alzheimers dx  . Hematuria   . Stroke [facility])    TIA    Patient Active Problem List   Diagnosis Date Noted  . Hematuria 11/07/2014  . Protein-calorie malnutrition, severe ([place]) 08/18/2014  . Diaphoresis 08/17/2014  . Near syncope 08/17/2014  . Sinus bradycardia 08/17/2014  . Dementia 08/17/2014  . Pulmonary nodule 08/17/2014    Past Surgical History:  Procedure Laterality Date  . BALLOON DILATION [name] 07/03/2014   Procedure: BALLOON DILATION;  Surgeon: Garlan Fair, MD;  Location: [facility];  Service: Endoscopy;  Laterality: [name];  . BALLOON DILATION [name] 06/03/2015   Procedure: BALLOON DILATION;  Surgeon: Garlan Fair, MD;  Location: [facility];  Service: Endoscopy;  Laterality: [name];  . CYSTOSCOPY [name] 11/07/2014   Procedure: CYSTOSCOPY;   Surgeon: Alexis Frock, MD;  Location: [facility];  Service: Urology;  Laterality: [name];  . ESOPHAGOGASTRODUODENOSCOPY [name] 07/03/2014   Procedure: ESOPHAGOGASTRODUODENOSCOPY (EGD);  Surgeon: Garlan Fair, MD;  Location: [facility];  Service: Endoscopy;  Laterality: [name];  . ESOPHAGOGASTRODUODENOSCOPY (EGD) WITH PROPOFOL [name] 06/03/2015   Procedure: ESOPHAGOGASTRODUODENOSCOPY (EGD) WITH PROPOFOL;  Surgeon: Garlan Fair, MD;  Location: [facility];  Service: Endoscopy;  Laterality: [name];  . HERNIA REPAIR  1970   double  . seed implant for prostate cancer  2006  . TRANSURETHRAL RESECTION OF PROSTATE [name] 11/07/2014   Procedure: TRANSURETHRAL VAPORIZATION OF THE PROSTATE AND BLADDER LESIONS WITH ERBE (NEW GYRUS) REMOVAL OF BLADDER STONE; PROSTATIC URETHRA BIOPSY;  Surgeon: Alexis Frock, MD;  Location: [facility];  Service: Urology;  Laterality: [name];       Home Medications    Prior to Admission medications   Medication Sig Start Date End Date Taking? Authorizing Provider  ALPRAZolam Duanne Moron) 0.5 MG tablet Take 0.5 mg by mouth every 6 (six) hours as needed for anxiety (aggitation).   Yes Historical Provider, MD  Calcium-Vitamin D-Vitamin K (VIACTIV) [id number] MG-UNT-MCG CHEW Chew 2 tablets by mouth 2 (two) times daily.   Yes Historical Provider, MD  citalopram (CELEXA) 20 MG tablet Take 20 mg by mouth at bedtime.   Yes Historical Provider, MD  Coenzyme Q10 (CO Q 10) 100 MG CAPS Take  1 capsule by mouth daily.   Yes Historical Provider, MD  cyanocobalamin 1000 MCG tablet Take 1,000 mcg by mouth every morning.    Yes Historical Provider, MD  memantine (NAMENDA) 10 MG tablet Take 10 mg by mouth 2 (two) times daily.    Yes Historical Provider, MD  omeprazole (PRILOSEC) 20 MG capsule Take 20 mg by mouth every morning.    Yes Historical Provider, MD  RED YEAST RICE EXTRACT PO Take 1,200 mg by mouth daily.   Yes Historical Provider, MD    Family History Family History  Problem Relation Age of Onset  .  Heart disease Mother     Social History Social History  Substance Use Topics  . Smoking status: Former Smoker    Packs/day: 1.00    Years: 10.00    Types: Cigarettes    Quit date: 08/31/1968  . Smokeless tobacco: Never Used  . Alcohol use No     Allergies   Patient has no known allergies.   Review of Systems Review of Systems  Unable to perform ROS: Dementia     Physical Exam Updated Vital Signs BP 168/76 (BP Location: Right Arm)   Pulse (!) 54   Temp 97.8 F (36.6 C) (Oral)   Resp 16   SpO2 100%   Physical Exam  Constitutional: He appears well-developed and well-nourished. No distress.  HENT:  Head: Normocephalic and atraumatic. Head is without raccoon's eyes and without Battle's sign.  Right Ear: No hemotympanum.  Left Ear: No hemotympanum.  Nose: Nose normal.  Neck:  C-collar in place: no midline tenderness.  Cardiovascular: Normal rate and regular rhythm.   Murmur (4/6 SEM) heard. Pulmonary/Chest: Effort normal and breath sounds normal. No respiratory distress.  Abdominal: Soft. He exhibits no distension. There is no tenderness.  Musculoskeletal:  Pelvis stable, full range of motion of the hips, no pain with log roll of the legs. No tenderness to palpation. No T or L-spine tenderness.  Neurological: He is alert.  Alert to person only. Moves all extremities well 4. Able to follow simple commands. Speech clear.   Skin: Skin is warm and dry.  Nursing note and vitals reviewed.    ED Treatments / Results  Labs (all labs ordered are listed, but only abnormal results are displayed) Labs Reviewed - No data to display  EKG  EKG Interpretation None       Radiology Ct Head Wo Contrast  Result Date: 07/22/2016 CLINICAL DATA:  History of fall, complaining of head pain and hip pain. History of dementia EXAM: CT HEAD WITHOUT CONTRAST CT CERVICAL SPINE WITHOUT CONTRAST TECHNIQUE: Multidetector CT imaging of the head and cervical spine was performed  following the standard protocol without intravenous contrast. Multiplanar CT image reconstructions of the cervical spine were also generated. COMPARISON:  06/17/2016 FINDINGS: CT HEAD FINDINGS Brain: No acute territorial infarction, intracranial hemorrhage, or focal mass lesion is visualized. There are no extra-axial fluid collections. Moderate global atrophy. Ventricles are slightly enlarged but similar compared to prior, and felt related to atrophy. Moderate periventricular and subcortical white matter hypodensities consistent with small vessel disease. Vascular: Carotid artery calcifications.  No hyperdense vessels. Skull: Mastoid air cells clear.  No skull fracture is seen. Sinuses/Orbits: Mild mucosal thickening in the ethmoid sinuses. No acute orbital abnormality. Other: None CT CERVICAL SPINE FINDINGS Alignment: Cervical alignment is similar compared to prior study. Minimal anterior listhesis of C4 on C5 and C7 on T1. Facet alignment is maintained. Skull base and vertebrae: Craniovertebral junction appears  intact. Vertebral body heights grossly maintained. Bony fusion at C5-C6 again visualized. Probable cyst within the C2 vertebral body unchanged. Soft tissues and spinal canal: No prevertebral fluid or swelling. No visible canal hematoma. Disc levels: Mild narrowing at C4-C5, moderate severe narrowing at C6-C7 and C7-T1 with endplate changes and osteophytes. Mild canal stenosis at C5 and C6. Multilevel hyper trophic facet arthropathy bilaterally results in bilateral multilevel foraminal stenosis. Upper chest: Lung apices clear. Thyroid gland stable. Carotid artery calcifications. Other: None IMPRESSION: 1. No CT evidence for acute intracranial abnormality. Moderate periventricular, subcortical, and deep white matter hypodensities, felt consistent with small vessel changes. 2. Multilevel degenerative disc changes of the cervical spine. No acute fracture or malalignment. Electronically Signed   By: Donavan Foil M.D.   On: 07/22/2016 14:18   Ct Cervical Spine Wo Contrast  Result Date: 07/22/2016 CLINICAL DATA:  History of fall, complaining of head pain and hip pain. History of dementia EXAM: CT HEAD WITHOUT CONTRAST CT CERVICAL SPINE WITHOUT CONTRAST TECHNIQUE: Multidetector CT imaging of the head and cervical spine was performed following the standard protocol without intravenous contrast. Multiplanar CT image reconstructions of the cervical spine were also generated. COMPARISON:  06/17/2016 FINDINGS: CT HEAD FINDINGS Brain: No acute territorial infarction, intracranial hemorrhage, or focal mass lesion is visualized. There are no extra-axial fluid collections. Moderate global atrophy. Ventricles are slightly enlarged but similar compared to prior, and felt related to atrophy. Moderate periventricular and subcortical white matter hypodensities consistent with small vessel disease. Vascular: Carotid artery calcifications.  No hyperdense vessels. Skull: Mastoid air cells clear.  No skull fracture is seen. Sinuses/Orbits: Mild mucosal thickening in the ethmoid sinuses. No acute orbital abnormality. Other: None CT CERVICAL SPINE FINDINGS Alignment: Cervical alignment is similar compared to prior study. Minimal anterior listhesis of C4 on C5 and C7 on T1. Facet alignment is maintained. Skull base and vertebrae: Craniovertebral junction appears intact. Vertebral body heights grossly maintained. Bony fusion at C5-C6 again visualized. Probable cyst within the C2 vertebral body unchanged. Soft tissues and spinal canal: No prevertebral fluid or swelling. No visible canal hematoma. Disc levels: Mild narrowing at C4-C5, moderate severe narrowing at C6-C7 and C7-T1 with endplate changes and osteophytes. Mild canal stenosis at C5 and C6. Multilevel hyper trophic facet arthropathy bilaterally results in bilateral multilevel foraminal stenosis. Upper chest: Lung apices clear. Thyroid gland stable. Carotid artery  calcifications. Other: None IMPRESSION: 1. No CT evidence for acute intracranial abnormality. Moderate periventricular, subcortical, and deep white matter hypodensities, felt consistent with small vessel changes. 2. Multilevel degenerative disc changes of the cervical spine. No acute fracture or malalignment. Electronically Signed   By: Donavan Foil M.D.   On: 07/22/2016 14:18   Dg Hips Bilat W Or Wo Pelvis 3-4 Views  Result Date: 07/22/2016 CLINICAL DATA:  Fall. EXAM: DG HIP (WITH OR WITHOUT PELVIS) 3-4V BILAT COMPARISON:  CT 08/28/2014. FINDINGS: Radiation seed implants noted in the pelvis. Pelvic calcifications consistent phleboliths. No acute bony or joint abnormality identified. IMPRESSION: No acute abnormality identified. Electronically Signed   By: Marcello Moores  Register   On: 07/22/2016 13:05    Procedures Procedures (including critical care time)  Medications Ordered in ED Medications - No data to display   Initial Impression / Assessment and Plan / ED Course  I have reviewed the triage vital signs and the nursing notes.  Pertinent labs & imaging results that were available during my care of the patient were reviewed by me and considered in my medical decision making (  see chart for details).  Clinical Course    LEVERT GILLUM is a 82 y.o. male who presents to ED from facility for evaluation after a mechanical fall prior to arrival. Patient appears to be at baseline mental status per chart review and facility. Patient did complain of hip pain to EMS, but has no complaints with me during evaluation. Given hx of dementia, x-ray of hips was obtained and reassuring. CT head and cervical spine negative for acute abnormalities. No abdominal or chest tenderness. No midline T/L spine tenderness. Patient safe for discharge back to nursing facility with encouragement to follow up with PCP. Return precautions written on discharge information for facility.    Patient discussed with Dr. Roderic Palau who  agrees with treatment plan.    Final Clinical Impressions(s) / ED Diagnoses   Final diagnoses:  Fall    New Prescriptions New Prescriptions   No medications on file     [facility] Ward, PA-C 07/22/16 1443    Milton Ferguson, MD 07/23/16 [phone]

## 2016-07-22 NOTE — ED Triage Notes (Signed)
Per EMS, patient states he fell this morning Patient initially complaining of head pain and bilateral hip pain. The pain has now subsided. Patient is at baseline per facility. Patient has a hx of dementia. Patient is from [facility].

## 2016-07-22 NOTE — Discharge Instructions (Signed)
You had a CT of your head and cervical spine which were negative for acute injuries from your fall today.  You also had an x-ray of your hips and pelvis with no acute injuries from the fall.  Follow up with your primary care provider for discussion of today's visit and help preventing falls. Return to ER for new or worsening symptoms, any additional concerns.

## 2016-07-27 DIAGNOSIS — I350 Nonrheumatic aortic (valve) stenosis: Secondary | ICD-10-CM | POA: Diagnosis not present

## 2016-07-27 DIAGNOSIS — G301 Alzheimer's disease with late onset: Secondary | ICD-10-CM | POA: Diagnosis not present

## 2016-07-27 DIAGNOSIS — R2689 Other abnormalities of gait and mobility: Secondary | ICD-10-CM | POA: Diagnosis not present

## 2016-07-27 DIAGNOSIS — F0280 Dementia in other diseases classified elsewhere without behavioral disturbance: Secondary | ICD-10-CM | POA: Diagnosis not present

## 2016-09-25 ENCOUNTER — Emergency Department (HOSPITAL_COMMUNITY): Payer: Medicare Other

## 2016-09-25 ENCOUNTER — Emergency Department (HOSPITAL_COMMUNITY)
Admission: EM | Admit: 2016-09-25 | Discharge: 2016-09-26 | Disposition: A | Discharge status: Home / Self Care | Payer: Medicare Other | Attending: Emergency Medicine | Admitting: Emergency Medicine

## 2016-09-25 ENCOUNTER — Encounter (HOSPITAL_COMMUNITY): Payer: Self-pay | Admitting: Emergency Medicine

## 2016-09-25 DIAGNOSIS — Z8673 Personal history of transient ischemic attack (TIA), and cerebral infarction without residual deficits: Secondary | ICD-10-CM | POA: Diagnosis not present

## 2016-09-25 DIAGNOSIS — Y939 Activity, unspecified: Secondary | ICD-10-CM | POA: Insufficient documentation

## 2016-09-25 DIAGNOSIS — Z8546 Personal history of malignant neoplasm of prostate: Secondary | ICD-10-CM | POA: Insufficient documentation

## 2016-09-25 DIAGNOSIS — Z87891 Personal history of nicotine dependence: Secondary | ICD-10-CM | POA: Diagnosis not present

## 2016-09-25 DIAGNOSIS — Y92129 Unspecified place in nursing home as the place of occurrence of the external cause: Secondary | ICD-10-CM | POA: Diagnosis not present

## 2016-09-25 DIAGNOSIS — Y999 Unspecified external cause status: Secondary | ICD-10-CM | POA: Insufficient documentation

## 2016-09-25 DIAGNOSIS — W19XXXA Unspecified fall, initial encounter: Secondary | ICD-10-CM

## 2016-09-25 DIAGNOSIS — W06XXXA Fall from bed, initial encounter: Secondary | ICD-10-CM | POA: Diagnosis not present

## 2016-09-25 DIAGNOSIS — S0101XA Laceration without foreign body of scalp, initial encounter: Secondary | ICD-10-CM | POA: Diagnosis not present

## 2016-09-25 DIAGNOSIS — S199XXA Unspecified injury of neck, initial encounter: Secondary | ICD-10-CM | POA: Diagnosis not present

## 2016-09-25 DIAGNOSIS — S0990XA Unspecified injury of head, initial encounter: Secondary | ICD-10-CM | POA: Diagnosis not present

## 2016-09-25 DIAGNOSIS — S0993XA Unspecified injury of face, initial encounter: Secondary | ICD-10-CM | POA: Diagnosis not present

## 2016-09-25 DIAGNOSIS — S098XXA Other specified injuries of head, initial encounter: Secondary | ICD-10-CM | POA: Diagnosis not present

## 2016-09-25 MED ORDER — LIDOCAINE-EPINEPHRINE (PF) 2 %-1:200000 IJ SOLN
20.0000 mL | Freq: Once | INTRAMUSCULAR | Status: AC
  Administered 2016-09-25: 20 mL
  Filled 2016-09-25: qty 20

## 2016-09-25 NOTE — Discharge Instructions (Signed)
Please read and follow all provided instructions.  Your diagnoses today include:  1. Fall, initial encounter   2. Scalp laceration, initial encounter     Tests performed today include:  CT scan of your head and neck that did not show any serious injury.  Vital signs. See below for your results today.   Medications prescribed:   None  Take any prescribed medications only as directed.  Home care instructions:  Follow any educational materials contained in this packet.  Follow-up instructions: Please follow-up with your primary care provider in 7 days for further evaluation of your symptoms and suture removal.   Return instructions:  SEEK IMMEDIATE MEDICAL ATTENTION IF:  There is confusion or drowsiness (although children frequently become drowsy after injury).   You cannot awaken the injured person.   You have more than one episode of vomiting.   You notice dizziness or unsteadiness which is getting worse, or inability to walk.   You have convulsions or unconsciousness.   You experience severe, persistent headaches not relieved by Tylenol.  You cannot use arms or legs normally.   There are changes in pupil sizes. (This is the black center in the colored part of the eye)   There is clear or bloody discharge from the nose or ears.   You have change in speech, vision, swallowing, or understanding.   Localized weakness, numbness, tingling, or change in bowel or bladder control.  You have any other emergent concerns.  Additional Information: You have had a head injury which does not appear to require admission at this time.  Your vital signs today were: BP 160/80 (BP Location: Left Arm)    Pulse (!) 53    Temp 98.3 F (36.8 C) (Oral)    Resp 18    SpO2 99%  If your blood pressure (BP) was elevated above 135/85 this visit, please have this repeated by your doctor within one month. --------------

## 2016-09-25 NOTE — ED Provider Notes (Signed)
Medical screening examination/treatment/procedure(s) were conducted as a shared visit with non-physician practitioner(s) and myself.  I personally evaluated the patient during the encounter.   EKG Interpretation None     83 year old male here after unwitnessed fall from nursing home. Has history of dementia. Did suffer laceration which was repaired. CT of head and C-spine negative for acute injury. Stable for return to nursing home.   Lacretia Leigh, MD 09/25/16 2303

## 2016-09-25 NOTE — ED Triage Notes (Signed)
Pt via EMS from [facility] for unwit fall out of bed.  Pt states he remembers that fall but can't remember what he was doing at the time/why he fell.  Hx dementia, at baseline neuro status.  1.5 in x 3 mm lac on L side of head, bleeding controlled with some bruising.  Pt denies neck, back, or hip pain, in C-colalr per protocol d/t age.  Not on blood thinners.  Reports R arm pain from old fall. 118/68, 64 bpm, 18 RR, 143 CBG

## 2016-09-25 NOTE — ED Notes (Signed)
Bed: [id number] Expected date:  Expected time:  Means of arrival:  Comments: EMS 83 yo male from SNF-fall-1 1/2 lac top of head with hematoma/c-collar-no blood thinners-fall unwitnessed

## 2016-09-25 NOTE — ED Provider Notes (Signed)
[place] DEPT Provider Note   CSN: [id number] Arrival date & time: 09/25/16  2123  By signing my name below, I, Dora Sims, attest that this documentation has been prepared under the direction and in the presence of [employer], PA-C. Electronically Signed: Dora Sims, Scribe. 09/25/2016. 9:29 PM.  History   Chief Complaint Chief Complaint  Patient presents with  . Fall    The history is provided by the patient. No language interpreter was used.     HPI Comments: LEVEL 5 CAVEAT DUE TO DEMENTIA [name] is a 83 y.o. male with PMHx significant for dementia who presents to the Emergency Department via EMS complaining of an unwitnessed fall that occurred shortly PTA. Per nursing staff, pt was found on the floor and there was a laceration to the left side of his scalp. No LOC per pt. He states he slipped prior to falling and denies any lightheadedness or dizziness. Pt reports he has been occasionally "leaking urine" recently. He states he ambulates unassisted at baseline. He denies dysuria, difficulty urinating, lower extremity pain, or any other associated symptoms.  Past Medical History:  Diagnosis Date  . Cancer [facility])    Prostate  . Dementia    Alzheimers  . GERD (gastroesophageal reflux disease)   . Heart murmur    systolic murmur no further eval d/t Alzheimers dx  . Hematuria   . Stroke [facility])    TIA    Patient Active Problem List   Diagnosis Date Noted  . Hematuria 11/07/2014  . Protein-calorie malnutrition, severe ([place]) 08/18/2014  . Diaphoresis 08/17/2014  . Near syncope 08/17/2014  . Sinus bradycardia 08/17/2014  . Dementia 08/17/2014  . Pulmonary nodule 08/17/2014    Past Surgical History:  Procedure Laterality Date  . BALLOON DILATION [name] 07/03/2014   Procedure: BALLOON DILATION;  Surgeon: Garlan Fair, MD;  Location: [facility];  Service: Endoscopy;  Laterality: [name];  . BALLOON DILATION [name] 06/03/2015   Procedure: BALLOON DILATION;   Surgeon: Garlan Fair, MD;  [facility];  Service: Endoscopy;  Laterality: [name];  . CYSTOSCOPY [name] 11/07/2014   Procedure: CYSTOSCOPY;  Surgeon: Alexis Frock, MD;  Location: [facility];  Service: Urology;  Laterality: [name];  . ESOPHAGOGASTRODUODENOSCOPY [name] 07/03/2014   Procedure: ESOPHAGOGASTRODUODENOSCOPY (EGD);  Surgeon: Garlan Fair, MD;  Location: [facility];  Service: Endoscopy;  Laterality: [name];  . ESOPHAGOGASTRODUODENOSCOPY (EGD) WITH PROPOFOL [name] 06/03/2015   Procedure: ESOPHAGOGASTRODUODENOSCOPY (EGD) WITH PROPOFOL;  Surgeon: Garlan Fair, MD;  [facility];  Service: Endoscopy;  Laterality: [name];  . HERNIA REPAIR  1970   double  . seed implant for prostate cancer  2006  . TRANSURETHRAL RESECTION OF PROSTATE [name] 11/07/2014   Procedure: TRANSURETHRAL VAPORIZATION OF THE PROSTATE AND BLADDER LESIONS WITH ERBE (NEW GYRUS) REMOVAL OF BLADDER STONE; PROSTATIC URETHRA BIOPSY;  Surgeon: Alexis Frock, MD;  Location: [facility];  Service: Urology;  Laterality: [name];       Home Medications    Prior to Admission medications   Medication Sig Start Date End Date Taking? Authorizing Provider  ALPRAZolam Duanne Moron) 0.5 MG tablet Take 0.5 mg by mouth every 6 (six) hours as needed for anxiety (aggitation).    Historical Provider, MD  Calcium-Vitamin D-Vitamin K (VIACTIV) [id number] MG-UNT-MCG CHEW Chew 2 tablets by mouth 2 (two) times daily.    Historical Provider, MD  citalopram (CELEXA) 20 MG tablet Take 20 mg by mouth at bedtime.    Historical Provider, MD  Coenzyme Q10 (CO Q 10)  100 MG CAPS Take 1 capsule by mouth daily.    Historical Provider, MD  cyanocobalamin 1000 MCG tablet Take 1,000 mcg by mouth every morning.     Historical Provider, MD  memantine (NAMENDA) 10 MG tablet Take 10 mg by mouth 2 (two) times daily.     Historical Provider, MD  omeprazole (PRILOSEC) 20 MG capsule Take 20 mg by mouth every morning.     Historical Provider, MD  RED YEAST RICE EXTRACT PO  Take 1,200 mg by mouth daily.    Historical Provider, MD    Family History Family History  Problem Relation Age of Onset  . Heart disease Mother     Social History Social History  Substance Use Topics  . Smoking status: Former Smoker    Packs/day: 1.00    Years: 10.00    Types: Cigarettes    Quit date: 08/31/1968  . Smokeless tobacco: Never Used  . Alcohol use No     Allergies   Patient has no known allergies.   Review of Systems Review of Systems  Unable to perform ROS: Dementia     Physical Exam Updated Vital Signs BP 160/80 (BP Location: Left Arm)   Pulse (!) 53   Temp 98.3 F (36.8 C) (Oral)   Resp 18   SpO2 99%   Physical Exam  Constitutional: He appears well-developed and well-nourished. No distress.  HENT:  Head: Normocephalic.  Mouth/Throat: Oropharynx is clear and moist.  3cm laceration L parietal scalp  Eyes: Conjunctivae and EOM are normal.  Neck: Neck supple. No tracheal deviation present.  Cardiovascular: Normal rate.  Exam reveals no friction rub.   No murmur heard. Pulmonary/Chest: Effort normal. No respiratory distress.  Abdominal: Bowel sounds are normal. He exhibits no distension. There is no tenderness. There is no guarding.  Musculoskeletal: Normal range of motion. He exhibits no edema or tenderness.  Full ROM all 4 extremities without difficulty. Flexes both legs at hip actively.   Neurological: He is alert.  Skin: Skin is warm and dry.  Psychiatric: He has a normal mood and affect. His behavior is normal.  Nursing note and vitals reviewed.    ED Treatments / Results   Procedures Procedures (including critical care time)  DIAGNOSTIC STUDIES: Oxygen Saturation is 99% on RA, normal by my interpretation.    COORDINATION OF CARE: 9:35 PM Will order CT Head w/o contrast and CT Cervical Spine w/o contrast. Will perform laceration repair.  Medications Ordered in ED Medications - No data to display   Initial Impression /  Assessment and Plan / ED Course  I have reviewed the triage vital signs and the nursing notes.  Pertinent labs & imaging results that were available during my care of the patient were reviewed by me and considered in my medical decision making (see chart for details).    Patient seen and examined. Work-up initiated.    Vital signs reviewed and are as follows: BP 160/80 (BP Location: Left Arm)   Pulse (!) 53   Temp 98.3 F (36.8 C) (Oral)   Resp 18   SpO2 99%   LACERATION REPAIR Performed by: Faustino Congress Authorized by: Faustino Congress Consent: Verbal consent obtained. Risks and benefits: risks, benefits and alternatives were discussed Consent given by: patient Patient identity confirmed: provided demographic data Prepped and Draped in normal sterile fashion Wound explored  Laceration Location: L parietal scalp  Laceration Length: 3cm  No Foreign Bodies seen or palpated  Anesthesia: local infiltration  Local anesthetic: lidocaine  2% with epinephrine  Anesthetic total: 3 ml  Irrigation method: skin scrub with dermal cleanser Amount of cleaning: standard  Skin closure: 5-0 Ethilon  Number of sutures: 5  Technique: simple interrupted  Patient tolerance: Patient tolerated the procedure well with no immediate complications.  11:02 PM Patient discussed with Dr. Zenia Resides who has seen. Patient ready to go.   Instructions provided with return criteria.   He will need sutures removed in 7 days   Final Clinical Impressions(s) / ED Diagnoses   Final diagnoses:  Fall, initial encounter  Scalp laceration, initial encounter   Pt at baseline. CT imaging neg. Wound repaired as above. Exam reveals no other obvious injury.   New Prescriptions New Prescriptions   No medications on file   I personally performed the services described in this documentation, which was scribed in my presence. The recorded information has been reviewed and is accurate.    Carlisle Cater,  PA-C 09/25/16 2306

## 2016-09-26 DIAGNOSIS — G8911 Acute pain due to trauma: Secondary | ICD-10-CM | POA: Diagnosis not present

## 2016-09-26 DIAGNOSIS — S0191XA Laceration without foreign body of unspecified part of head, initial encounter: Secondary | ICD-10-CM | POA: Diagnosis not present

## 2016-09-26 DIAGNOSIS — R4182 Altered mental status, unspecified: Secondary | ICD-10-CM | POA: Diagnosis not present

## 2016-10-15 DIAGNOSIS — K219 Gastro-esophageal reflux disease without esophagitis: Secondary | ICD-10-CM | POA: Diagnosis not present

## 2016-10-15 DIAGNOSIS — G301 Alzheimer's disease with late onset: Secondary | ICD-10-CM | POA: Diagnosis not present

## 2016-10-15 DIAGNOSIS — R269 Unspecified abnormalities of gait and mobility: Secondary | ICD-10-CM | POA: Diagnosis not present

## 2016-10-15 DIAGNOSIS — M6281 Muscle weakness (generalized): Secondary | ICD-10-CM | POA: Diagnosis not present

## 2016-10-15 DIAGNOSIS — I350 Nonrheumatic aortic (valve) stenosis: Secondary | ICD-10-CM | POA: Diagnosis not present

## 2016-10-18 ENCOUNTER — Emergency Department (HOSPITAL_COMMUNITY)
Admission: EM | Admit: 2016-10-18 | Discharge: 2016-10-18 | Disposition: A | Discharge status: Home / Self Care | Payer: Medicare Other | Attending: Emergency Medicine | Admitting: Emergency Medicine

## 2016-10-18 ENCOUNTER — Encounter (HOSPITAL_COMMUNITY): Payer: Self-pay | Admitting: Emergency Medicine

## 2016-10-18 ENCOUNTER — Emergency Department (HOSPITAL_COMMUNITY): Payer: Medicare Other

## 2016-10-18 ENCOUNTER — Other Ambulatory Visit: Payer: Self-pay

## 2016-10-18 ENCOUNTER — Encounter (HOSPITAL_COMMUNITY): Payer: Self-pay

## 2016-10-18 ENCOUNTER — Emergency Department (HOSPITAL_COMMUNITY)
Admission: EM | Admit: 2016-10-18 | Discharge: 2016-10-18 | Disposition: A | Discharge status: Home / Self Care | Payer: Medicare Other | Source: Home / Self Care | Attending: Emergency Medicine | Admitting: Emergency Medicine

## 2016-10-18 DIAGNOSIS — G309 Alzheimer's disease, unspecified: Secondary | ICD-10-CM | POA: Insufficient documentation

## 2016-10-18 DIAGNOSIS — S199XXA Unspecified injury of neck, initial encounter: Secondary | ICD-10-CM | POA: Diagnosis not present

## 2016-10-18 DIAGNOSIS — Z79899 Other long term (current) drug therapy: Secondary | ICD-10-CM | POA: Insufficient documentation

## 2016-10-18 DIAGNOSIS — F0390 Unspecified dementia without behavioral disturbance: Secondary | ICD-10-CM | POA: Diagnosis not present

## 2016-10-18 DIAGNOSIS — R1110 Vomiting, unspecified: Secondary | ICD-10-CM | POA: Diagnosis not present

## 2016-10-18 DIAGNOSIS — R531 Weakness: Secondary | ICD-10-CM | POA: Diagnosis not present

## 2016-10-18 DIAGNOSIS — M542 Cervicalgia: Secondary | ICD-10-CM | POA: Insufficient documentation

## 2016-10-18 DIAGNOSIS — M5440 Lumbago with sciatica, unspecified side: Secondary | ICD-10-CM | POA: Diagnosis not present

## 2016-10-18 DIAGNOSIS — S299XXA Unspecified injury of thorax, initial encounter: Secondary | ICD-10-CM | POA: Diagnosis not present

## 2016-10-18 DIAGNOSIS — Y9289 Other specified places as the place of occurrence of the external cause: Secondary | ICD-10-CM | POA: Insufficient documentation

## 2016-10-18 DIAGNOSIS — S1980XA Other specified injuries of unspecified part of neck, initial encounter: Secondary | ICD-10-CM | POA: Diagnosis not present

## 2016-10-18 DIAGNOSIS — M545 Low back pain: Secondary | ICD-10-CM

## 2016-10-18 DIAGNOSIS — Z87891 Personal history of nicotine dependence: Secondary | ICD-10-CM

## 2016-10-18 DIAGNOSIS — M5489 Other dorsalgia: Secondary | ICD-10-CM | POA: Diagnosis not present

## 2016-10-18 DIAGNOSIS — W19XXXA Unspecified fall, initial encounter: Secondary | ICD-10-CM

## 2016-10-18 DIAGNOSIS — R112 Nausea with vomiting, unspecified: Secondary | ICD-10-CM | POA: Diagnosis not present

## 2016-10-18 DIAGNOSIS — R059 Cough, unspecified: Secondary | ICD-10-CM

## 2016-10-18 DIAGNOSIS — S0990XA Unspecified injury of head, initial encounter: Secondary | ICD-10-CM | POA: Diagnosis not present

## 2016-10-18 DIAGNOSIS — R259 Unspecified abnormal involuntary movements: Secondary | ICD-10-CM | POA: Diagnosis not present

## 2016-10-18 DIAGNOSIS — Z8546 Personal history of malignant neoplasm of prostate: Secondary | ICD-10-CM

## 2016-10-18 DIAGNOSIS — R05 Cough: Secondary | ICD-10-CM | POA: Diagnosis not present

## 2016-10-18 DIAGNOSIS — Y939 Activity, unspecified: Secondary | ICD-10-CM | POA: Insufficient documentation

## 2016-10-18 DIAGNOSIS — R031 Nonspecific low blood-pressure reading: Secondary | ICD-10-CM | POA: Diagnosis not present

## 2016-10-18 DIAGNOSIS — T148XXA Other injury of unspecified body region, initial encounter: Secondary | ICD-10-CM | POA: Diagnosis not present

## 2016-10-18 DIAGNOSIS — Z8673 Personal history of transient ischemic attack (TIA), and cerebral infarction without residual deficits: Secondary | ICD-10-CM | POA: Insufficient documentation

## 2016-10-18 DIAGNOSIS — W19XXXD Unspecified fall, subsequent encounter: Secondary | ICD-10-CM

## 2016-10-18 DIAGNOSIS — Y999 Unspecified external cause status: Secondary | ICD-10-CM

## 2016-10-18 DIAGNOSIS — R079 Chest pain, unspecified: Secondary | ICD-10-CM | POA: Diagnosis not present

## 2016-10-18 LAB — I-STAT CHEM 8, ED
BUN: 11 mg/dL (ref 6–20)
Calcium, Ion: 1.16 mmol/L (ref 1.15–1.40)
Chloride: 102 mmol/L (ref 101–111)
Creatinine, Ser: 0.9 mg/dL (ref 0.61–1.24)
Glucose, Bld: 102 mg/dL — ABNORMAL HIGH (ref 65–99)
HCT: 44 % (ref 39.0–52.0)
HEMOGLOBIN: 15 g/dL (ref 13.0–17.0)
POTASSIUM: 3.5 mmol/L (ref 3.5–5.1)
SODIUM: 142 mmol/L (ref 135–145)
TCO2: 28 mmol/L (ref 0–100)

## 2016-10-18 MED ORDER — ONDANSETRON 4 MG PO TBDP: 4.0000 mg | ORAL_TABLET | Freq: Three times a day (TID) | ORAL | 0 refills | Status: DC | PRN

## 2016-10-18 MED ORDER — ONDANSETRON 4 MG PO TBDP
4.0000 mg | ORAL_TABLET | Freq: Once | ORAL | Status: AC
  Administered 2016-10-18: 4 mg via ORAL
  Filled 2016-10-18: qty 1

## 2016-10-18 NOTE — ED Provider Notes (Signed)
[facility] DEPT Provider Note   CSN: [id number] Arrival date & time: 10/18/16  1436     History   Chief Complaint Chief Complaint  Patient presents with  . Emesis  . Fall    HPI [name] is a 83 y.o. male. Pt has hx of dementia and presents from memory care unit after developing a cough and emesis after he returned to his facility. Pt does not recall falling. Denies any pain. Denies any CP or SOB. He has emesis and clear secretions on his clothing.  HPI  Past Medical History:  Diagnosis Date  . Cancer [facility])    Prostate  . Dementia    Alzheimers  . GERD (gastroesophageal reflux disease)   . Heart murmur    systolic murmur no further eval d/t Alzheimers dx  . Hematuria   . Stroke [facility])    TIA    Patient Active Problem List   Diagnosis Date Noted  . Hematuria 11/07/2014  . Protein-calorie malnutrition, severe ([place]) 08/18/2014  . Diaphoresis 08/17/2014  . Near syncope 08/17/2014  . Sinus bradycardia 08/17/2014  . Dementia 08/17/2014  . Pulmonary nodule 08/17/2014    Past Surgical History:  Procedure Laterality Date  . BALLOON DILATION [name] 07/03/2014   Procedure: BALLOON DILATION;  Surgeon: Garlan Fair, MD;  Location: [facility];  Service: Endoscopy;  Laterality: [name];  . BALLOON DILATION [name] 06/03/2015   Procedure: BALLOON DILATION;  Surgeon: Garlan Fair, MD;  Location: [facility];  Service: Endoscopy;  Laterality: [name];  . CYSTOSCOPY [name] 11/07/2014   Procedure: CYSTOSCOPY;  Surgeon: Alexis Frock, MD;  Location: [facility] ORS;  Service: Urology;  Laterality: [name];  . ESOPHAGOGASTRODUODENOSCOPY [name] 07/03/2014   Procedure: ESOPHAGOGASTRODUODENOSCOPY (EGD);  Surgeon: Garlan Fair, MD;  Location: [facility];  Service: Endoscopy;  Laterality: [name];  . ESOPHAGOGASTRODUODENOSCOPY (EGD) WITH PROPOFOL [name] 06/03/2015   Procedure: ESOPHAGOGASTRODUODENOSCOPY (EGD) WITH PROPOFOL;  Surgeon: Garlan Fair, MD;  [facility];  Service: Endoscopy;   Laterality: [name];  . HERNIA REPAIR  1970   double  . seed implant for prostate cancer  2006  . TRANSURETHRAL RESECTION OF PROSTATE [name] 11/07/2014   Procedure: TRANSURETHRAL VAPORIZATION OF THE PROSTATE AND BLADDER LESIONS WITH ERBE (NEW GYRUS) REMOVAL OF BLADDER STONE; PROSTATIC URETHRA BIOPSY;  Surgeon: Alexis Frock, MD;  Location: [facility] ORS;  Service: Urology;  Laterality: [name];       Home Medications    Prior to Admission medications   Medication Sig Start Date End Date Taking? Authorizing Provider  ALPRAZolam Duanne Moron) 0.5 MG tablet Take 0.5 mg by mouth every 6 (six) hours as needed for anxiety (aggitation).    Historical Provider, MD  Calcium-Vitamin D-Vitamin K (VIACTIV) [id number] MG-UNT-MCG CHEW Chew 2 tablets by mouth 2 (two) times daily.    Historical Provider, MD  citalopram (CELEXA) 20 MG tablet Take 20 mg by mouth at bedtime.    Historical Provider, MD  Coenzyme Q10 (CO Q 10) 100 MG CAPS Take 1 capsule by mouth daily.    Historical Provider, MD  cyanocobalamin 1000 MCG tablet Take 1,000 mcg by mouth every morning.     Historical Provider, MD  memantine (NAMENDA) 10 MG tablet Take 10 mg by mouth 2 (two) times daily.     Historical Provider, MD  omeprazole (PRILOSEC) 20 MG capsule Take 20 mg by mouth every morning.     Historical Provider, MD  RED YEAST RICE EXTRACT PO Take 1,200 mg by mouth daily.    Historical Provider, MD  Family History Family History  Problem Relation Age of Onset  . Heart disease Mother     Social History Social History  Substance Use Topics  . Smoking status: Former Smoker    Packs/day: 1.00    Years: 10.00    Types: Cigarettes    Quit date: 08/31/1968  . Smokeless tobacco: Never Used  . Alcohol use No     Allergies   Patient has no known allergies.   Review of Systems Review of Systems  Unable to perform ROS: Dementia     Physical Exam Updated Vital Signs BP 174/65   Temp 98.9 F (37.2 C) (Oral)   Ht 5\' 10"  (1.778 m)   Wt 59 kg    BMI 18.65 kg/m   Physical Exam  Constitutional: He appears well-developed.  Chronically ill-appearing  HENT:  Head: Normocephalic and atraumatic.  Eyes: Conjunctivae are normal.  Neck: Normal range of motion. Neck supple.  Cardiovascular: Normal rate, regular rhythm and intact distal pulses.   No murmur heard. Pulmonary/Chest: Effort normal. No respiratory distress.  Rhonchi in bilateral lower lung fields  Abdominal: Soft. He exhibits no distension and no mass. There is no tenderness. There is no rebound and no guarding.  Musculoskeletal: He exhibits no edema, tenderness or deformity.  Neurological: He is alert.  Oriented to name only  Skin: Skin is warm and dry.  Psychiatric: He has a normal mood and affect.  Nursing note and vitals reviewed.    ED Treatments / Results  Labs (all labs ordered are listed, but only abnormal results are displayed) Labs Reviewed  I-STAT CHEM 8, ED    EKG  EKG Interpretation None       Radiology Dg Cervical Spine Complete  Result Date: 10/18/2016 CLINICAL DATA:  Found on floor, presumed fall. History of back and neck pain. Initial encounter. EXAM: CERVICAL SPINE - COMPLETE 4+ VIEW COMPARISON:  09/25/2016 FINDINGS: Limited visualization of the cervicothoracic junction in the lateral projection, but normally aligned based on the obliques. No visible fracture or prevertebral thickening. No traumatic malalignment. Diffuse advanced disc degeneration. There is C5-6 interbody fusion or ankylosis. Diffuse facet arthropathy with spurring greatest at C3-4 and C4-5. IMPRESSION: 1. No evidence of cervical spine injury. 2. Diffuse advanced spinal degeneration. Electronically Signed   By: Monte Fantasia M.D.   On: 10/18/2016 10:51   Dg Lumbar Spine Complete  Result Date: 10/18/2016 CLINICAL DATA:  83 year old male with history of trauma from a fall this morning complaining of low back pain. EXAM: LUMBAR SPINE - COMPLETE 4+ VIEW COMPARISON:  No priors.  FINDINGS: Multiple views is of the lumbar spine demonstrate no acute displaced fracture or definite compression type fracture. Alignment is anatomic. No defects of the pars interarticularis are noted. Mild multilevel degenerative disc disease and facet arthropathy, most pronounced at L4-L5 and L5-S1. Extensive aortic atherosclerosis. IMPRESSION: 1. No acute radiographic abnormality of the lumbar spine. 2. Mild multilevel degenerative disc disease and lumbar spondylosis, as above. 3. Aortic atherosclerosis. Electronically Signed   By: Vinnie Langton M.D.   On: 10/18/2016 10:56    Procedures Procedures (including critical care time)  Medications Ordered in ED Medications - No data to display   Initial Impression / Assessment and Plan / ED Course  I have reviewed the triage vital signs and the nursing notes.  Pertinent labs & imaging results that were available during my care of the patient were reviewed by me and considered in my medical decision making (see chart for details).  Patient is a 83 year old male with history as above who presents from a memory care unit with chief complaint of cough and emesis. Patient had an unwitnessed fall this morning and was seen at [facility] earlier today. At that time, he had mild back pain. Plain films of the cervical spine and lumbar spine were negative for acute injuries. He was discharged back to his facility. I spoke with someone at his facility who states after he returned home, he ate lunch and then was noted to have some vomiting and cough. They sent him back here to be reevaluated and were concerned that he might have a head injury causing his emesis. Here, his neuro exam is nonfocal. History is limited due to his dementia. He does not have any complaints here and does not appear to be in any pain. CT scan of his head and cervical spine were negative for acute injuries. Labs were unremarkable. He was not observed to have any emesis here, however he is  coughing up clear secretions. Chest x-ray showed no evidence of pneumonia. Patient is stable at this time for discharge back to his facility. Zofran prescription provided in the event he has any recurrent emesis.  Final Clinical Impressions(s) / ED Diagnoses   Final diagnoses:  None    New Prescriptions New Prescriptions   No medications on file     Clifton James, MD 10/18/16 1826

## 2016-10-18 NOTE — ED Notes (Signed)
Pt coughing up clear mucous. Given emesis bag but spitting on floor. This writer placed trash can bedside bed for pt to spit into.

## 2016-10-18 NOTE — ED Notes (Signed)
Phlebotomy at bedside collecting blood 

## 2016-10-18 NOTE — ED Notes (Signed)
Patient transported to CT 

## 2016-10-18 NOTE — ED Notes (Signed)
02 sat alarm keeps  Alarming    02 sat wire  Placed on the pts forehead  sats 100% now

## 2016-10-18 NOTE — ED Triage Notes (Signed)
Staff at [facility] unit found pt. On the floor in his room, presumably having fallen. Upon attempting to assist him to his feet he was found to have low back pain. He arrives here awake, alert and in no distress. He is speaking plainly, but is confused at his baseline per [facility] staff. He moves all extremities with ease.

## 2016-10-18 NOTE — ED Notes (Signed)
Pt returned to room from CT and placed back on monitor. Pt given emesis bag for continued spitting. Spit is frothy and clear.

## 2016-10-18 NOTE — ED Provider Notes (Signed)
I have personally seen and examined the patient. I have reviewed the documentation on PMH/FH/Soc Hx. I have discussed the plan of care with the resident and patient.  I have reviewed and agree with the resident's documentation. Please see associated encounter note.   EKG Interpretation None         Fatima Blank, MD 10/18/16 1816

## 2016-10-18 NOTE — ED Provider Notes (Signed)
[facility] DEPT Provider Note   CSN: [id number] Arrival date & time: 10/18/16  0932     History   Chief Complaint Chief Complaint  Patient presents with  . Fall    Level V caveat: Dementia  HPI [name] is a 83 y.o. male.  HPI 83 year old male with a history of dementia who was found down with an unwitnessed fall at the memory care unit today.  Sent to the emergency department for evaluation.  Initially complained of low back pain.  Reports some neck pain as well.  Presented immobilized in a cervical collar.  Patient denies weakness of his arms or legs.  Wiggles his toes.  Normal grip strength bilaterally.  Per report baseline mental status.   Past Medical History:  Diagnosis Date  . Cancer [facility])    Prostate  . Dementia    Alzheimers  . GERD (gastroesophageal reflux disease)   . Heart murmur    systolic murmur no further eval d/t Alzheimers dx  . Hematuria   . Stroke [facility])    TIA    Patient Active Problem List   Diagnosis Date Noted  . Hematuria 11/07/2014  . Protein-calorie malnutrition, severe ([place]) 08/18/2014  . Diaphoresis 08/17/2014  . Near syncope 08/17/2014  . Sinus bradycardia 08/17/2014  . Dementia 08/17/2014  . Pulmonary nodule 08/17/2014    Past Surgical History:  Procedure Laterality Date  . BALLOON DILATION [name] 07/03/2014   Procedure: BALLOON DILATION;  Surgeon: Garlan Fair, MD;  Location: [facility];  Service: Endoscopy;  Laterality: [name];  . BALLOON DILATION [name] 06/03/2015   Procedure: BALLOON DILATION;  Surgeon: Garlan Fair, MD;  [facility];  Service: Endoscopy;  Laterality: [name];  . CYSTOSCOPY [name] 11/07/2014   Procedure: CYSTOSCOPY;  Surgeon: Alexis Frock, MD;  [facility];  Service: Urology;  Laterality: [name];  . ESOPHAGOGASTRODUODENOSCOPY [name] 07/03/2014   Procedure: ESOPHAGOGASTRODUODENOSCOPY (EGD);  Surgeon: Garlan Fair, MD;  Location: [facility];  Service: Endoscopy;  Laterality: [name];  .  ESOPHAGOGASTRODUODENOSCOPY (EGD) WITH PROPOFOL [name] 06/03/2015   Procedure: ESOPHAGOGASTRODUODENOSCOPY (EGD) WITH PROPOFOL;  Surgeon: Garlan Fair, MD;  [facility];  Service: Endoscopy;  Laterality: [name];  . HERNIA REPAIR  1970   double  . seed implant for prostate cancer  2006  . TRANSURETHRAL RESECTION OF PROSTATE [name] 11/07/2014   Procedure: TRANSURETHRAL VAPORIZATION OF THE PROSTATE AND BLADDER LESIONS WITH ERBE (NEW GYRUS) REMOVAL OF BLADDER STONE; PROSTATIC URETHRA BIOPSY;  Surgeon: Alexis Frock, MD;  Location[facility];  Service: Urology;  Laterality: [name];       Home Medications    Prior to Admission medications   Medication Sig Start Date End Date Taking? Authorizing Provider  ALPRAZolam Duanne Moron) 0.5 MG tablet Take 0.5 mg by mouth every 6 (six) hours as needed for anxiety (aggitation).    Historical Provider, MD  Calcium-Vitamin D-Vitamin K (VIACTIV) [id number] MG-UNT-MCG CHEW Chew 2 tablets by mouth 2 (two) times daily.    Historical Provider, MD  citalopram (CELEXA) 20 MG tablet Take 20 mg by mouth at bedtime.    Historical Provider, MD  Coenzyme Q10 (CO Q 10) 100 MG CAPS Take 1 capsule by mouth daily.    Historical Provider, MD  cyanocobalamin 1000 MCG tablet Take 1,000 mcg by mouth every morning.     Historical Provider, MD  memantine (NAMENDA) 10 MG tablet Take 10 mg by mouth 2 (two) times daily.     Historical Provider, MD  omeprazole (PRILOSEC) 20 MG capsule  Take 20 mg by mouth every morning.     Historical Provider, MD  RED YEAST RICE EXTRACT PO Take 1,200 mg by mouth daily.    Historical Provider, MD    Family History Family History  Problem Relation Age of Onset  . Heart disease Mother     Social History Social History  Substance Use Topics  . Smoking status: Former Smoker    Packs/day: 1.00    Years: 10.00    Types: Cigarettes    Quit date: 08/31/1968  . Smokeless tobacco: Never Used  . Alcohol use No     Allergies   Patient has no known  allergies.   Review of Systems Review of Systems  Unable to perform ROS: Dementia     Physical Exam Updated Vital Signs BP 166/74 (BP Location: Right Arm)   Pulse (!) 59   Temp 98.2 F (36.8 C) (Oral)   Resp 16   SpO2 100%   Physical Exam  Constitutional: He is oriented to person, place, and time. He appears well-developed and well-nourished.  HENT:  Head: Normocephalic.  Eyes: EOM are normal.  Neck:  Immobilized in cervical collar.  Mild cervical and paracervical tenderness without cervical step-off  Pulmonary/Chest: Effort normal.  Abdominal: He exhibits no distension.  Musculoskeletal: Normal range of motion.  Full range of motion bilateral hips, knees, ankles.  Full range of motion bilateral shoulders, elbows, wrists.  No thoracic tenderness.  Mild lumbar and paralumbar tenderness.  Neurological: He is alert and oriented to person, place, and time.  Psychiatric: He has a normal mood and affect.  Nursing note and vitals reviewed.    ED Treatments / Results  Labs (all labs ordered are listed, but only abnormal results are displayed) Labs Reviewed - No data to display  EKG  EKG Interpretation None       Radiology Dg Cervical Spine Complete  Result Date: 10/18/2016 CLINICAL DATA:  Found on floor, presumed fall. History of back and neck pain. Initial encounter. EXAM: CERVICAL SPINE - COMPLETE 4+ VIEW COMPARISON:  09/25/2016 FINDINGS: Limited visualization of the cervicothoracic junction in the lateral projection, but normally aligned based on the obliques. No visible fracture or prevertebral thickening. No traumatic malalignment. Diffuse advanced disc degeneration. There is C5-6 interbody fusion or ankylosis. Diffuse facet arthropathy with spurring greatest at C3-4 and C4-5. IMPRESSION: 1. No evidence of cervical spine injury. 2. Diffuse advanced spinal degeneration. Electronically Signed   By: [place] Fantasia M.D.   On: 10/18/2016 10:51   Dg Lumbar Spine  Complete  Result Date: 10/18/2016 CLINICAL DATA:  83 year old male with history of trauma from a fall this morning complaining of low back pain. EXAM: LUMBAR SPINE - COMPLETE 4+ VIEW COMPARISON:  No priors. FINDINGS: Multiple views is of the lumbar spine demonstrate no acute displaced fracture or definite compression type fracture. Alignment is anatomic. No defects of the pars interarticularis are noted. Mild multilevel degenerative disc disease and facet arthropathy, most pronounced at L4-L5 and L5-S1. Extensive aortic atherosclerosis. IMPRESSION: 1. No acute radiographic abnormality of the lumbar spine. 2. Mild multilevel degenerative disc disease and lumbar spondylosis, as above. 3. Aortic atherosclerosis. Electronically Signed   By: Vinnie Langton M.D.   On: 10/18/2016 10:56    Procedures Procedures (including critical care time)  Medications Ordered in ED Medications - No data to display   Initial Impression / Assessment and Plan / ED Course  I have reviewed the triage vital signs and the nursing notes.  Pertinent labs &  imaging results that were available during my care of the patient were reviewed by me and considered in my medical decision making (see chart for details).     Patient is overall well-appearing.  Full range of motion of major joints.  Cervical and lumbar films are negative.  Discharge back to the nursing facility.  Final Clinical Impressions(s) / ED Diagnoses   Final diagnoses:  Fall, initial encounter  Neck pain  Low back pain, unspecified back pain laterality, unspecified chronicity, with sciatica presence unspecified    New Prescriptions New Prescriptions   No medications on file     Jola Schmidt, MD 10/18/16 1119

## 2016-10-18 NOTE — Discharge Instructions (Signed)
-   Chest X-ray showed no evidence of pneumonia - Head CT showed no acute injury - Cervical spine CT showed no acute injury - Lab workup was unremarkable - Zofran prescribed for patient in case he continues having any nausea

## 2016-10-18 NOTE — ED Notes (Signed)
ED Provider at bedside. 

## 2016-10-18 NOTE — ED Triage Notes (Addendum)
Pt in from [facility], via [facility] after unwitnessed fall, seen earlier today at [facility] ED. Per EMS, pt had been released back to facility, had lunch at 1230 and has been vomitting/coughing since 1400. EMS gave 4mg  Zofran PTA.  Productive cough present, clear, thick secretions. Pt a&ox2 at baseline, afebrile, MAE's equally, NAD, VSS

## 2016-10-18 NOTE — ED Notes (Signed)
Pt vomiting into trash can at bedside.

## 2016-10-18 NOTE — ED Notes (Signed)
He remains in no distress and is comfortable in appearance. PTAR notified for tx back to his nursing home.

## 2016-10-21 ENCOUNTER — Emergency Department (HOSPITAL_COMMUNITY)
Admission: EM | Admit: 2016-10-21 | Discharge: 2016-10-21 | Disposition: A | Discharge status: Home / Self Care | Payer: Medicare Other | Attending: Emergency Medicine | Admitting: Emergency Medicine

## 2016-10-21 ENCOUNTER — Emergency Department (HOSPITAL_COMMUNITY): Payer: Medicare Other

## 2016-10-21 DIAGNOSIS — F0390 Unspecified dementia without behavioral disturbance: Secondary | ICD-10-CM | POA: Diagnosis not present

## 2016-10-21 DIAGNOSIS — W19XXXA Unspecified fall, initial encounter: Secondary | ICD-10-CM | POA: Diagnosis not present

## 2016-10-21 DIAGNOSIS — S299XXA Unspecified injury of thorax, initial encounter: Secondary | ICD-10-CM | POA: Diagnosis not present

## 2016-10-21 DIAGNOSIS — N390 Urinary tract infection, site not specified: Secondary | ICD-10-CM | POA: Insufficient documentation

## 2016-10-21 DIAGNOSIS — M545 Low back pain: Secondary | ICD-10-CM | POA: Diagnosis not present

## 2016-10-21 DIAGNOSIS — Y92129 Unspecified place in nursing home as the place of occurrence of the external cause: Secondary | ICD-10-CM | POA: Diagnosis not present

## 2016-10-21 DIAGNOSIS — Y939 Activity, unspecified: Secondary | ICD-10-CM | POA: Diagnosis not present

## 2016-10-21 DIAGNOSIS — R05 Cough: Secondary | ICD-10-CM | POA: Diagnosis not present

## 2016-10-21 DIAGNOSIS — Z8546 Personal history of malignant neoplasm of prostate: Secondary | ICD-10-CM | POA: Diagnosis not present

## 2016-10-21 DIAGNOSIS — G308 Other Alzheimer's disease: Secondary | ICD-10-CM | POA: Diagnosis not present

## 2016-10-21 DIAGNOSIS — M546 Pain in thoracic spine: Secondary | ICD-10-CM | POA: Diagnosis not present

## 2016-10-21 DIAGNOSIS — Y999 Unspecified external cause status: Secondary | ICD-10-CM | POA: Insufficient documentation

## 2016-10-21 DIAGNOSIS — M542 Cervicalgia: Secondary | ICD-10-CM | POA: Diagnosis not present

## 2016-10-21 DIAGNOSIS — Z8673 Personal history of transient ischemic attack (TIA), and cerebral infarction without residual deficits: Secondary | ICD-10-CM | POA: Diagnosis not present

## 2016-10-21 DIAGNOSIS — Z87891 Personal history of nicotine dependence: Secondary | ICD-10-CM | POA: Diagnosis not present

## 2016-10-21 DIAGNOSIS — J189 Pneumonia, unspecified organism: Secondary | ICD-10-CM

## 2016-10-21 LAB — CBC WITH DIFFERENTIAL/PLATELET
Basophils Absolute: 0 10*3/uL (ref 0.0–0.1)
Basophils Relative: 0 %
Eosinophils Absolute: 0 10*3/uL (ref 0.0–0.7)
Eosinophils Relative: 0 %
HCT: 44.9 % (ref 39.0–52.0)
Hemoglobin: 15.1 g/dL (ref 13.0–17.0)
Lymphocytes Relative: 7 %
Lymphs Abs: 0.5 10*3/uL — ABNORMAL LOW (ref 0.7–4.0)
MCH: 29.8 pg (ref 26.0–34.0)
MCHC: 33.6 g/dL (ref 30.0–36.0)
MCV: 88.7 fL (ref 78.0–100.0)
Monocytes Absolute: 0.5 10*3/uL (ref 0.1–1.0)
Monocytes Relative: 6 %
Neutro Abs: 6.8 10*3/uL (ref 1.7–7.7)
Neutrophils Relative %: 87 %
Platelets: 149 10*3/uL — ABNORMAL LOW (ref 150–400)
RBC: 5.06 MIL/uL (ref 4.22–5.81)
RDW: 14.7 % (ref 11.5–15.5)
WBC: 7.8 10*3/uL (ref 4.0–10.5)

## 2016-10-21 LAB — BASIC METABOLIC PANEL
Anion gap: 9 (ref 5–15)
BUN: 37 mg/dL — ABNORMAL HIGH (ref 6–20)
CO2: 28 mmol/L (ref 22–32)
Calcium: 9.3 mg/dL (ref 8.9–10.3)
Chloride: 105 mmol/L (ref 101–111)
Creatinine, Ser: 1.42 mg/dL — ABNORMAL HIGH (ref 0.61–1.24)
GFR calc Af Amer: 49 mL/min — ABNORMAL LOW (ref >60)
GFR calc non Af Amer: 43 mL/min — ABNORMAL LOW (ref >60)
Glucose, Bld: 110 mg/dL — ABNORMAL HIGH (ref 65–99)
Potassium: 4 mmol/L (ref 3.5–5.1)
Sodium: 142 mmol/L (ref 135–145)

## 2016-10-21 LAB — URINALYSIS, ROUTINE W REFLEX MICROSCOPIC
Bilirubin Urine: NEGATIVE
Glucose, UA: NEGATIVE mg/dL
Ketones, ur: 20 mg/dL — AB
Nitrite: NEGATIVE
Protein, ur: 30 mg/dL — AB
Specific Gravity, Urine: 1.024 (ref 1.005–1.030)
pH: 5 (ref 5.0–8.0)

## 2016-10-21 LAB — INFLUENZA PANEL BY PCR (TYPE A & B)
Influenza A By PCR: NEGATIVE
Influenza B By PCR: NEGATIVE

## 2016-10-21 MED ORDER — SODIUM CHLORIDE 0.9 % IV BOLUS (SEPSIS)
1000.0000 mL | Freq: Once | INTRAVENOUS | Status: AC
  Administered 2016-10-21: 1000 mL via INTRAVENOUS

## 2016-10-21 MED ORDER — CEPHALEXIN 500 MG PO CAPS: 500.0000 mg | ORAL_CAPSULE | Freq: Three times a day (TID) | ORAL | 0 refills | Status: DC

## 2016-10-21 MED ORDER — ONDANSETRON HCL 4 MG/2ML IJ SOLN
4.0000 mg | Freq: Once | INTRAMUSCULAR | Status: AC
  Administered 2016-10-21: 4 mg via INTRAVENOUS
  Filled 2016-10-21: qty 2

## 2016-10-21 MED ORDER — AZITHROMYCIN 250 MG PO TABS
500.0000 mg | ORAL_TABLET | Freq: Once | ORAL | Status: AC
  Administered 2016-10-21: 500 mg via ORAL
  Filled 2016-10-21: qty 2

## 2016-10-21 MED ORDER — AZITHROMYCIN 250 MG PO TABS: 250.0000 mg | ORAL_TABLET | Freq: Every day | ORAL | 0 refills | Status: DC

## 2016-10-21 MED ORDER — DEXTROSE 5 % IV SOLN
1.0000 g | Freq: Once | INTRAVENOUS | Status: AC
  Administered 2016-10-21: 1 g via INTRAVENOUS
  Filled 2016-10-21: qty 10

## 2016-10-21 MED ORDER — ONDANSETRON HCL 4 MG PO TABS: 4.0000 mg | ORAL_TABLET | Freq: Three times a day (TID) | ORAL | 0 refills | Status: DC | PRN

## 2016-10-21 MED ORDER — ACETAMINOPHEN 500 MG PO TABS
1000.0000 mg | ORAL_TABLET | Freq: Once | ORAL | Status: AC
  Administered 2016-10-21: 1000 mg via ORAL
  Filled 2016-10-21: qty 2

## 2016-10-21 NOTE — ED Notes (Signed)
Failed attmpt to collect urine w/In & Out

## 2016-10-21 NOTE — ED Notes (Signed)
Bed: [id number] Expected date:  Expected time:  Means of arrival:  Comments: 89 unwitnessed fall
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
## 2016-10-21 NOTE — ED Triage Notes (Signed)
Per EMS, pt is from [facility] after experiencing an unwitnessed fall between 6:30 and 7:30 this morning. Pt was found laying beside the bed. EMS states pt is currently complaining of neck and back pain. EMS reports that pt does not take blood thinners. Per EMS pt has rhonchi on right side of lungs. Pt has a hx of dementia and is alert to self and according to staff that is baseline for pt. Pt was recently sent to ED for a fall on Sunday. Staff reports that pt usually walks without assistive devices.

## 2016-10-21 NOTE — ED Notes (Signed)
PTAR notified regarding need for pt transportation.  

## 2016-10-21 NOTE — ED Notes (Signed)
Attempted to call report twice to [facility]. No response.
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
## 2016-10-21 NOTE — ED Provider Notes (Signed)
[place] DEPT Provider Note   CSN: [id number] Arrival date & time: 10/21/16  [id number]     History   Chief Complaint Chief Complaint  Patient presents with  . Fall    HPI [name] is a 83 y.o. male.  HPI   52yM presenting after Bonilla unwitnessed fall between 6:30 and 7:30 this morning. Pt was found laying beside the bed. EMS states pt is currently complaining of neck and back pain. EMS reports that pt does not take blood thinners. Per EMS pt has rhonchi on right side of lungs. Pt has a hx of dementia and is alert to self and according to staff that is baseline for pt. Pt was recently sent to ED for a fall on Sunday. Staff reports that pt usually walks without assistive devices. He is a poor historian.    Past Medical History:  Diagnosis Date  . Cancer [facility])    Prostate  . Dementia    Alzheimers  . GERD (gastroesophageal reflux disease)   . Heart murmur    systolic murmur no further eval d/t Alzheimers dx  . Hematuria   . Stroke [facility])    TIA    Patient Active Problem List   Diagnosis Date Noted  . Hematuria 11/07/2014  . Protein-calorie malnutrition, severe ([place]) 08/18/2014  . Diaphoresis 08/17/2014  . Near syncope 08/17/2014  . Sinus bradycardia 08/17/2014  . Dementia 08/17/2014  . Pulmonary nodule 08/17/2014    Past Surgical History:  Procedure Laterality Date  . BALLOON DILATION [name] 07/03/2014   Procedure: BALLOON DILATION;  Surgeon: Garlan Fair, MD;  Location: [facility];  Service: Endoscopy;  Laterality: [name];  . BALLOON DILATION [name] 06/03/2015   Procedure: BALLOON DILATION;  Surgeon: Garlan Fair, MD;  Location: [facility] ENDOSCOPY;  Service: Endoscopy;  Laterality: [name];  . CYSTOSCOPY [name] 11/07/2014   Procedure: CYSTOSCOPY;  Surgeon: Alexis Frock, MD;  Location: [facility] ORS;  Service: Urology;  Laterality: [name];  . ESOPHAGOGASTRODUODENOSCOPY [name] 07/03/2014   Procedure: ESOPHAGOGASTRODUODENOSCOPY (EGD);  Surgeon: Garlan Fair, MD;  Location: [facility]OS[facility];  Service: Endoscopy;  Laterality: [name];  . ESOPHAGOGASTRODUODENOSCOPY (EGD) WITH PROPOFOL [name] 06/03/2015   Procedure: ESOPHAGOGASTRODUODENOSCOPY (EGD) WITH PROPOFOL;  Surgeon: Garlan Fair, MD;  Location: [facility] ENDOSCOPY;  Service: Endoscopy;  Laterality: [name];  . HERNIA REPAIR  1970   double  . seed implant for prostate cancer  2006  . TRANSURETHRAL RESECTION OF PROSTATE [name] 11/07/2014   Procedure: TRANSURETHRAL VAPORIZATION OF THE PROSTATE AND BLADDER LESIONS WITH ERBE (NEW GYRUS) REMOVAL OF BLADDER STONE; PROSTATIC URETHRA BIOPSY;  Surgeon: Alexis Frock, MD;  Location: [facility] ORS;  Service: Urology;  Laterality: [name];       Home Medications    Prior to Admission medications   Medication Sig Start Date End Date Taking? Authorizing Provider  acetaminophen (TYLENOL) 325 MG tablet Take 650 mg by mouth 3 (three) times daily.    Historical Provider, MD  ALPRAZolam Duanne Moron) 0.5 MG tablet Take 0.5 mg by mouth every 6 (six) hours as needed for anxiety (aggitation).    Historical Provider, MD  Calcium-Vitamin D-Vitamin K (VIACTIV) [id number] MG-UNT-MCG CHEW Chew 2 tablets by mouth 2 (two) times daily.    Historical Provider, MD  citalopram (CELEXA) 20 MG tablet Take 20 mg by mouth at bedtime.    Historical Provider, MD  Coenzyme Q10 (CO Q 10) 100 MG CAPS Take 1 capsule by mouth daily.    Historical Provider, MD  cyanocobalamin 1000 MCG tablet Take 1,000 mcg  by mouth every morning.     Historical Provider, MD  memantine (NAMENDA) 10 MG tablet Take 10 mg by mouth 2 (two) times daily.     Historical Provider, MD  omeprazole (PRILOSEC) 20 MG capsule Take 20 mg by mouth every morning.     Historical Provider, MD  ondansetron (ZOFRAN ODT) 4 MG disintegrating tablet Take 1 tablet (4 mg total) by mouth every 8 (eight) hours as needed for nausea or vomiting. 10/18/16   Clifton James, MD  RED YEAST RICE EXTRACT PO Take 1,200 mg by mouth daily.    Historical Provider, MD    Family History Family  History  Problem Relation Age of Onset  . Heart disease Mother     Social History Social History  Substance Use Topics  . Smoking status: Former Smoker    Packs/day: 1.00    Years: 10.00    Types: Cigarettes    Quit date: 08/31/1968  . Smokeless tobacco: Never Used  . Alcohol use No     Allergies   Patient has no known allergies.   Review of Systems Review of Systems  Level 5 caveat because of dementia.   Physical Exam Updated Vital Signs BP 150/68   Pulse 72   Temp 99.1 F (37.3 C) (Oral)   Resp 24   SpO2 97%   Physical Exam  Constitutional: He appears well-developed and well-nourished. No distress.  Elderly/frail appearing male sitting up in bed. Somewhat disheveled appearance. Does not appear to be acutely distressed.  HENT:  Head: Normocephalic and atraumatic.  Sutured wound to left parietal scalp. This appears to at least be at several weeks old. Is healing well. No concerning skin findings. No external signs of acute trauma.  Eyes: Conjunctivae and EOM are normal. Pupils are equal, round, and reactive to light. Right eye exhibits no discharge. Left eye exhibits no discharge.  Neck: Neck supple.  Cardiovascular: Normal rate, regular rhythm and normal heart sounds.  Exam reveals no gallop and no friction rub.   No murmur heard. Pulmonary/Chest: Effort normal and breath sounds normal. No respiratory distress.  Abdominal: Soft. He exhibits no distension. There is no tenderness.  Musculoskeletal: He exhibits no edema or tenderness.  Patient winces as if in pain when sitting up. I cannot clearly elicit pain with palpation of his neck or back. No apparent pain with range of motion of the large joints. Moves all extremities with good/equal strength.   Neurological: He is alert.  Skin: Skin is warm and dry.  Psychiatric:  Patient is awake and alert. He is only oriented to himself. Does follow some simple commands. He does answer simple questions although his answers  are sometimes inconsistent (answering "yes" when asked if his back hurts but when asked where specifically it hurts replying that it doesn't hurt).   Nursing note and vitals reviewed.    ED Treatments / Results  Labs (all labs ordered are listed, but only abnormal results are displayed) Labs Reviewed  URINE CULTURE - Abnormal; Notable for the following:       Result Value   Culture MULTIPLE SPECIES PRESENT, SUGGEST RECOLLECTION (*)    All other components within normal limits  URINALYSIS, ROUTINE W REFLEX MICROSCOPIC - Abnormal; Notable for the following:    APPearance HAZY (*)    Hgb urine dipstick LARGE (*)    Ketones, ur 20 (*)    Protein, ur 30 (*)    Leukocytes, UA SMALL (*)    Bacteria, UA RARE (*)  Squamous Epithelial / LPF 0-5 (*)    Non Squamous Epithelial 0-5 (*)    All other components within normal limits  BASIC METABOLIC PANEL - Abnormal; Notable for the following:    Glucose, Bld 110 (*)    BUN 37 (*)    Creatinine, Ser 1.42 (*)    GFR calc non Af Amer 43 (*)    GFR calc Af Amer 49 (*)    All other components within normal limits  CBC WITH DIFFERENTIAL/PLATELET - Abnormal; Notable for the following:    Platelets 149 (*)    Lymphs Abs 0.5 (*)    All other components within normal limits  INFLUENZA PANEL BY PCR (TYPE A & B)    EKG  EKG Interpretation None       Radiology No results found.  Dg Chest 2 View  Result Date: 10/21/2016 CLINICAL DATA:  Cough, history of prostate cancer EXAM: CHEST  2 VIEW COMPARISON:  10/18/2016 FINDINGS: Cardiomediastinal silhouette is stable. Nodule in left midlung is stable in size in appearance from prior exam measures about 7.6 mm. Mild perihilar and infrahilar increased bronchial and interstitial markings bilaterally. Bronchitic changes or viral pneumonitis cannot be excluded. Clinical correlation is necessary. Less likely pulmonary edema. Bony thorax is unremarkable. IMPRESSION: Nodule in left midlung is stable in size in  appearance from prior exam measures about 7.6 mm. Mild perihilar and infrahilar increased bronchial and interstitial markings bilaterally. Bronchitic changes or viral pneumonitis cannot be excluded. Clinical correlation is necessary. Less likely pulmonary edema. Electronically Signed   By: Lahoma Crocker M.D.   On: 10/21/2016 10:45   Dg Cervical Spine Complete  Result Date: 10/18/2016 CLINICAL DATA:  Found on floor, presumed fall. History of back and neck pain. Initial encounter. EXAM: CERVICAL SPINE - COMPLETE 4+ VIEW COMPARISON:  09/25/2016 FINDINGS: Limited visualization of the cervicothoracic junction in the lateral projection, but normally aligned based on the obliques. No visible fracture or prevertebral thickening. No traumatic malalignment. Diffuse advanced disc degeneration. There is C5-6 interbody fusion or ankylosis. Diffuse facet arthropathy with spurring greatest at C3-4 and C4-5. IMPRESSION: 1. No evidence of cervical spine injury. 2. Diffuse advanced spinal degeneration. Electronically Signed   By: Monte Fantasia M.D.   On: 10/18/2016 10:51   Dg Thoracic Spine 2 View  Result Date: 10/21/2016 CLINICAL DATA:  Thoracic spine pain since a fall this morning. Initial encounter. EXAM: THORACIC SPINE 2 VIEWS COMPARISON:  None. FINDINGS: There is no evidence of thoracic spine fracture. Alignment is normal. No other significant bone abnormalities are identified. IMPRESSION: No acute abnormality. Electronically Signed   By: Inge Rise M.D.   On: 10/21/2016 09:53   Dg Lumbar Spine Complete  Result Date: 10/21/2016 CLINICAL DATA:  Unwitnessed fall loss morning.  Back pain. EXAM: LUMBAR SPINE - COMPLETE 4+ VIEW COMPARISON:  10/18/2016 lumbar spine radiographs. FINDINGS: This report assumes 5 non rib-bearing lumbar vertebrae. Minimal levocurvature of the lumbar spine. Lumbar vertebral body heights are preserved, with no fracture. Mild multilevel degenerative disc disease throughout the lumbar spine,  most prominent at L2-3, unchanged. Minimal 2 mm retrolisthesis at L2-3, stable. No new spondylolisthesis. Mild-to-moderate facet arthropathy bilaterally in the lower lumbar spine. No aggressive appearing focal osseous lesions. Abdominal aortic atherosclerosis. Brachytherapy seeds overlie the lower pelvis on the coned lateral view. Stable tiny metallic density fragments throughout the bilateral abdomen. IMPRESSION: 1. No lumbar spine fracture or acute malalignment. 2. Stable mild multilevel lumbar degenerative disc disease, mild-to-moderate lower lumbar facet arthropathy and minimal  degenerative retrolisthesis at L2-3. 3. Aortic atherosclerosis. Electronically Signed   By: Ilona Sorrel M.D.   On: 10/21/2016 09:56   Dg Lumbar Spine Complete  Result Date: 10/18/2016 CLINICAL DATA:  83 year old male with history of trauma from a fall this morning complaining of low back pain. EXAM: LUMBAR SPINE - COMPLETE 4+ VIEW COMPARISON:  No priors. FINDINGS: Multiple views is of the lumbar spine demonstrate no acute displaced fracture or definite compression type fracture. Alignment is anatomic. No defects of the pars interarticularis are noted. Mild multilevel degenerative disc disease and facet arthropathy, most pronounced at L4-L5 and L5-S1. Extensive aortic atherosclerosis. IMPRESSION: 1. No acute radiographic abnormality of the lumbar spine. 2. Mild multilevel degenerative disc disease and lumbar spondylosis, as above. 3. Aortic atherosclerosis. Electronically Signed   By: Vinnie Langton M.D.   On: 10/18/2016 10:56   Ct Head Wo Contrast  Result Date: 10/18/2016 CLINICAL DATA:  83 year old male with history of trauma from a fall. Emesis. EXAM: CT HEAD WITHOUT CONTRAST CT CERVICAL SPINE WITHOUT CONTRAST TECHNIQUE: Multidetector CT imaging of the head and cervical spine was performed following the standard protocol without intravenous contrast. Multiplanar CT image reconstructions of the cervical spine were also  generated. COMPARISON:  Multiple priors, most recently head CT 09/25/2016 and cervical spine 09/25/2016. FINDINGS: CT HEAD FINDINGS Brain: Mild cerebral atrophy. Patchy and confluent areas of decreased attenuation are noted throughout the deep and periventricular white matter of the cerebral hemispheres bilaterally, compatible with chronic microvascular ischemic disease. No evidence of acute infarction, hemorrhage, hydrocephalus, extra-axial collection or mass lesion/mass effect. Vascular: No hyperdense vessel or unexpected calcification. Skull: Normal. Negative for fracture or focal lesion. Sinuses/Orbits: No acute finding. Other: None. CT CERVICAL SPINE FINDINGS Alignment: Normal. Skull base and vertebrae: No acute fracture. No primary bone lesion or focal pathologic process. Soft tissues and spinal canal: No prevertebral fluid or swelling. No visible canal hematoma. Disc levels: Multilevel degenerative disc disease, most severe at C5-C6, C6-C7 and C7-T1. At C5-C6 there is near complete bony fusion. Multilevel facet arthropathy. Upper chest: Visualized portions of the upper thorax are unremarkable. Other: None. IMPRESSION: 1. No evidence of significant acute traumatic injury to the skull, brain or cervical spine. 2. Mild cerebral atrophy with extensive chronic microvascular ischemic changes in the cerebral white matter redemonstrated. 3. Multilevel degenerative disc disease and cervical spondylosis, as above. Electronically Signed   By: Vinnie Langton M.D.   On: 10/18/2016 17:51   Ct Cervical Spine Wo Contrast  Result Date: 10/18/2016 CLINICAL DATA:  83 year old male with history of trauma from a fall. Emesis. EXAM: CT HEAD WITHOUT CONTRAST CT CERVICAL SPINE WITHOUT CONTRAST TECHNIQUE: Multidetector CT imaging of the head and cervical spine was performed following the standard protocol without intravenous contrast. Multiplanar CT image reconstructions of the cervical spine were also generated. COMPARISON:   Multiple priors, most recently head CT 09/25/2016 and cervical spine 09/25/2016. FINDINGS: CT HEAD FINDINGS Brain: Mild cerebral atrophy. Patchy and confluent areas of decreased attenuation are noted throughout the deep and periventricular white matter of the cerebral hemispheres bilaterally, compatible with chronic microvascular ischemic disease. No evidence of acute infarction, hemorrhage, hydrocephalus, extra-axial collection or mass lesion/mass effect. Vascular: No hyperdense vessel or unexpected calcification. Skull: Normal. Negative for fracture or focal lesion. Sinuses/Orbits: No acute finding. Other: None. CT CERVICAL SPINE FINDINGS Alignment: Normal. Skull base and vertebrae: No acute fracture. No primary bone lesion or focal pathologic process. Soft tissues and spinal canal: No prevertebral fluid or swelling. No visible canal hematoma.  Disc levels: Multilevel degenerative disc disease, most severe at C5-C6, C6-C7 and C7-T1. At C5-C6 there is near complete bony fusion. Multilevel facet arthropathy. Upper chest: Visualized portions of the upper thorax are unremarkable. Other: None. IMPRESSION: 1. No evidence of significant acute traumatic injury to the skull, brain or cervical spine. 2. Mild cerebral atrophy with extensive chronic microvascular ischemic changes in the cerebral white matter redemonstrated. 3. Multilevel degenerative disc disease and cervical spondylosis, as above. Electronically Signed   By: Vinnie Langton M.D.   On: 10/18/2016 17:51   Dg Chest Port 1 View  Result Date: 11/01/2016 CLINICAL DATA:  AMS x 2 weeks; hx heart murmur per chart EXAM: PORTABLE CHEST 1 VIEW COMPARISON:  10/21/2016 FINDINGS: Heart size is normal. Aorta is tortuous. There is patchy infiltrate in the medial right lung base, consistent with infectious process. No pulmonary edema. IMPRESSION: Right lower lobe infiltrate. Electronically Signed   By: Nolon Nations M.D.   On: 11/01/2016 15:23   Dg Chest Port 1  View  Result Date: 10/18/2016 CLINICAL DATA:  Pain vomiting and unwitnessed fall. EXAM: PORTABLE CHEST 1 VIEW COMPARISON:  06/18/2016. FINDINGS: 1611 hours. Rightward patient rotation displaces cardiomediastinal anatomy and in the medial right hemithorax. Prominence of the cardiomediastinal contour likely related to AP supine technique. The lungs are clear wiithout focal pneumonia, edema, pneumothorax or pleural effusion. 6 mm left mid lung nodule again noted. Interstitial markings are diffusely coarsened with chronic features. The visualized bony structures of the thorax are intact. IMPRESSION: 1. Stable 6 mm nodule left mid lung. 2. No other acute cardiopulmonary findings. Electronically Signed   By: Misty Stanley M.D.   On: 10/18/2016 16:51    Procedures Procedures (including critical care time)  Medications Ordered in ED Medications - No data to display   Initial Impression / Assessment and Plan / ED Course  I have reviewed the triage vital signs and the nursing notes.  Pertinent labs & imaging results that were available during my care of the patient were reviewed by me and considered in my medical decision making (see chart for details).     83 year old male with unwitnessed fall at nursing home. Unfortunately, he is not a good historian. He does say that he fell, but cannot provide specifics otherwise. He does say that his back hurts but cannot really localize it. He does wince in pain when sitting him up in bed. Palpation of his back does not seem to reproduce it though. His back is normal in appearance. He has no midline spinal tenderness. Ranging his neck does not seem to illicit any pain. Range of motion of all his large joints does not seem to illicit any pain either.  He smells of urine. Because of his multiple recent falls, will check urinalysis, EKG and some basic labs. Will also obtain imaging thoracic and lumbar spine. He has no midline tenderness and ranging his neck doesn't  seem to bother him at all. Head and cervical imaging deferred.  Sutures noted to his scalp. This wound is healing very well. Per review of records, the sutures are placed ~4 weeks ago. They were removed. No external signs of acute trauma.   9:41 AM Discussed with Willeen Cass, RN who is Care Service Manager at [facility] [phone]). It's out of character for Mr. Matey to fall this frequently. This is fourth ED visit in the past month and third in the past four days. She reports he has also had a productive cough recently. Will also  obtain CXR.   He is febrile rectally. Chest x-ray as above. Canot get areliable review of systems from him. We will cover for possible bacterial pneumonia with reported new and productive cough. His work of breathing is not increased but O2 sats occasionally dropping into the mid to low 90s on room air. Additionally, his labs are consistent with dehydration. Increase of his creatinine from his baseline. BUN is also elevated at a ratio greater than 20:1. We'll place Bonilla IV given some IV fluids.  Final Clinical Impressions(s) / ED Diagnoses   Final diagnoses:  Community acquired pneumonia, unspecified laterality  Urinary tract infection without hematuria, site unspecified    New Prescriptions New Prescriptions   No medications on file     Virgel Manifold, MD 11/06/16 [id number]

## 2016-10-22 DIAGNOSIS — N390 Urinary tract infection, site not specified: Secondary | ICD-10-CM | POA: Diagnosis not present

## 2016-10-22 DIAGNOSIS — I350 Nonrheumatic aortic (valve) stenosis: Secondary | ICD-10-CM | POA: Diagnosis not present

## 2016-10-22 DIAGNOSIS — G301 Alzheimer's disease with late onset: Secondary | ICD-10-CM | POA: Diagnosis not present

## 2016-10-22 DIAGNOSIS — K222 Esophageal obstruction: Secondary | ICD-10-CM | POA: Diagnosis not present

## 2016-10-22 DIAGNOSIS — J189 Pneumonia, unspecified organism: Secondary | ICD-10-CM | POA: Diagnosis not present

## 2016-10-22 LAB — URINE CULTURE

## 2016-10-23 ENCOUNTER — Other Ambulatory Visit: Payer: Self-pay

## 2016-10-23 NOTE — Patient Outreach (Signed)
[facility]) Care Management  10/23/2016  BARNIE SHALHOUB 1926-02-03 [id number]   Telephone call to patient for high ED Utilization.  No answer.  Unable to leave a message.    Plan: RN Health Coach will attempt patient again within ten business days.    Jone Baseman, RN, MSN [place] [phone]

## 2016-10-24 DIAGNOSIS — R296 Repeated falls: Secondary | ICD-10-CM | POA: Diagnosis not present

## 2016-10-24 DIAGNOSIS — K219 Gastro-esophageal reflux disease without esophagitis: Secondary | ICD-10-CM | POA: Diagnosis not present

## 2016-10-24 DIAGNOSIS — I350 Nonrheumatic aortic (valve) stenosis: Secondary | ICD-10-CM | POA: Diagnosis not present

## 2016-10-24 DIAGNOSIS — F0280 Dementia in other diseases classified elsewhere without behavioral disturbance: Secondary | ICD-10-CM | POA: Diagnosis not present

## 2016-10-24 DIAGNOSIS — Z9181 History of falling: Secondary | ICD-10-CM | POA: Diagnosis not present

## 2016-10-24 DIAGNOSIS — K222 Esophageal obstruction: Secondary | ICD-10-CM | POA: Diagnosis not present

## 2016-10-24 DIAGNOSIS — G301 Alzheimer's disease with late onset: Secondary | ICD-10-CM | POA: Diagnosis not present

## 2016-10-24 DIAGNOSIS — J189 Pneumonia, unspecified organism: Secondary | ICD-10-CM | POA: Diagnosis not present

## 2016-10-24 DIAGNOSIS — R2689 Other abnormalities of gait and mobility: Secondary | ICD-10-CM | POA: Diagnosis not present

## 2016-10-24 DIAGNOSIS — M6281 Muscle weakness (generalized): Secondary | ICD-10-CM | POA: Diagnosis not present

## 2016-10-27 ENCOUNTER — Other Ambulatory Visit: Payer: Self-pay

## 2016-10-27 NOTE — Patient Outreach (Signed)
[facility]) Care Management  10/27/2016  THOMPSON GATO [birth year] [id number]   Telephone call to patient for high ED utilization referral.  No answer.  HIPAA compliant voice message left.  Called to sister Army Chaco who is HCPOA verifies that patient is in memory care unit at [facility].  She states that patient has gotten progressively worse and that his wife could no longer handle and patient was placed there.  She states that patient is getting physical therapy while there but not sure of how much help that will be given patient memory issues.  Discussed with her [place], which she states she is very familiar with as her sister had services in the past.  She is aware that patient being in a facility that we cannot follow or states be much help as staff at facility provide all care.  She is agreeable to receive brochure and letter if needed in the future.    Plan: RN Health Coach will send letter and brochure. Will notify care management assistant of case status.    Jone Baseman, RN, MSN [place] [phone]

## 2016-10-28 DIAGNOSIS — I350 Nonrheumatic aortic (valve) stenosis: Secondary | ICD-10-CM | POA: Diagnosis not present

## 2016-10-29 DIAGNOSIS — F0280 Dementia in other diseases classified elsewhere without behavioral disturbance: Secondary | ICD-10-CM | POA: Diagnosis not present

## 2016-10-29 DIAGNOSIS — R296 Repeated falls: Secondary | ICD-10-CM | POA: Diagnosis not present

## 2016-10-29 DIAGNOSIS — M6281 Muscle weakness (generalized): Secondary | ICD-10-CM | POA: Diagnosis not present

## 2016-10-29 DIAGNOSIS — R2689 Other abnormalities of gait and mobility: Secondary | ICD-10-CM | POA: Diagnosis not present

## 2016-10-29 DIAGNOSIS — J189 Pneumonia, unspecified organism: Secondary | ICD-10-CM | POA: Diagnosis not present

## 2016-10-29 DIAGNOSIS — G301 Alzheimer's disease with late onset: Secondary | ICD-10-CM | POA: Diagnosis not present

## 2016-10-30 DIAGNOSIS — M6281 Muscle weakness (generalized): Secondary | ICD-10-CM | POA: Diagnosis not present

## 2016-10-30 DIAGNOSIS — F0280 Dementia in other diseases classified elsewhere without behavioral disturbance: Secondary | ICD-10-CM | POA: Diagnosis not present

## 2016-10-30 DIAGNOSIS — R296 Repeated falls: Secondary | ICD-10-CM | POA: Diagnosis not present

## 2016-10-30 DIAGNOSIS — G301 Alzheimer's disease with late onset: Secondary | ICD-10-CM | POA: Diagnosis not present

## 2016-10-30 DIAGNOSIS — R2689 Other abnormalities of gait and mobility: Secondary | ICD-10-CM | POA: Diagnosis not present

## 2016-10-30 DIAGNOSIS — J189 Pneumonia, unspecified organism: Secondary | ICD-10-CM | POA: Diagnosis not present

## 2016-11-01 ENCOUNTER — Encounter (HOSPITAL_COMMUNITY): Payer: Self-pay

## 2016-11-01 ENCOUNTER — Emergency Department (HOSPITAL_COMMUNITY): Payer: Medicare Other

## 2016-11-01 ENCOUNTER — Inpatient Hospital Stay (HOSPITAL_COMMUNITY)
Admission: EM | Admit: 2016-11-01 | Discharge: 2016-11-05 | DRG: 177 | Disposition: A | Discharge status: Skilled Nursing Facility | Payer: Medicare Other | Attending: Internal Medicine | Admitting: Internal Medicine

## 2016-11-01 DIAGNOSIS — J189 Pneumonia, unspecified organism: Secondary | ICD-10-CM | POA: Diagnosis not present

## 2016-11-01 DIAGNOSIS — Z7189 Other specified counseling: Secondary | ICD-10-CM | POA: Diagnosis not present

## 2016-11-01 DIAGNOSIS — S0081XA Abrasion of other part of head, initial encounter: Secondary | ICD-10-CM | POA: Diagnosis not present

## 2016-11-01 DIAGNOSIS — Z8546 Personal history of malignant neoplasm of prostate: Secondary | ICD-10-CM | POA: Diagnosis not present

## 2016-11-01 DIAGNOSIS — Z66 Do not resuscitate: Secondary | ICD-10-CM | POA: Diagnosis present

## 2016-11-01 DIAGNOSIS — N179 Acute kidney failure, unspecified: Secondary | ICD-10-CM | POA: Diagnosis present

## 2016-11-01 DIAGNOSIS — Z8701 Personal history of pneumonia (recurrent): Secondary | ICD-10-CM

## 2016-11-01 DIAGNOSIS — E870 Hyperosmolality and hypernatremia: Secondary | ICD-10-CM | POA: Diagnosis present

## 2016-11-01 DIAGNOSIS — J690 Pneumonitis due to inhalation of food and vomit: Principal | ICD-10-CM | POA: Diagnosis present

## 2016-11-01 DIAGNOSIS — Z8673 Personal history of transient ischemic attack (TIA), and cerebral infarction without residual deficits: Secondary | ICD-10-CM | POA: Diagnosis not present

## 2016-11-01 DIAGNOSIS — R4182 Altered mental status, unspecified: Secondary | ICD-10-CM

## 2016-11-01 DIAGNOSIS — N289 Disorder of kidney and ureter, unspecified: Secondary | ICD-10-CM

## 2016-11-01 DIAGNOSIS — K219 Gastro-esophageal reflux disease without esophagitis: Secondary | ICD-10-CM | POA: Diagnosis present

## 2016-11-01 DIAGNOSIS — E860 Dehydration: Secondary | ICD-10-CM | POA: Diagnosis not present

## 2016-11-01 DIAGNOSIS — R001 Bradycardia, unspecified: Secondary | ICD-10-CM | POA: Diagnosis present

## 2016-11-01 DIAGNOSIS — T148XXA Other injury of unspecified body region, initial encounter: Secondary | ICD-10-CM | POA: Diagnosis not present

## 2016-11-01 DIAGNOSIS — G309 Alzheimer's disease, unspecified: Secondary | ICD-10-CM | POA: Diagnosis present

## 2016-11-01 DIAGNOSIS — R260 Ataxic gait: Secondary | ICD-10-CM | POA: Diagnosis not present

## 2016-11-01 DIAGNOSIS — F0390 Unspecified dementia without behavioral disturbance: Secondary | ICD-10-CM | POA: Diagnosis not present

## 2016-11-01 DIAGNOSIS — Z87891 Personal history of nicotine dependence: Secondary | ICD-10-CM | POA: Diagnosis not present

## 2016-11-01 DIAGNOSIS — G9340 Encephalopathy, unspecified: Secondary | ICD-10-CM | POA: Diagnosis present

## 2016-11-01 DIAGNOSIS — E876 Hypokalemia: Secondary | ICD-10-CM | POA: Diagnosis present

## 2016-11-01 DIAGNOSIS — R918 Other nonspecific abnormal finding of lung field: Secondary | ICD-10-CM | POA: Diagnosis not present

## 2016-11-01 DIAGNOSIS — Z8249 Family history of ischemic heart disease and other diseases of the circulatory system: Secondary | ICD-10-CM

## 2016-11-01 DIAGNOSIS — F0280 Dementia in other diseases classified elsewhere without behavioral disturbance: Secondary | ICD-10-CM | POA: Diagnosis present

## 2016-11-01 DIAGNOSIS — R51 Headache: Secondary | ICD-10-CM | POA: Diagnosis not present

## 2016-11-01 LAB — CBC WITH DIFFERENTIAL/PLATELET
BASOS ABS: 0 10*3/uL (ref 0.0–0.1)
BASOS PCT: 0 %
EOS PCT: 1 %
Eosinophils Absolute: 0.1 10*3/uL (ref 0.0–0.7)
HEMATOCRIT: 48.5 % (ref 39.0–52.0)
Hemoglobin: 15.9 g/dL (ref 13.0–17.0)
Lymphocytes Relative: 8 %
Lymphs Abs: 1 10*3/uL (ref 0.7–4.0)
MCH: 29.3 pg (ref 26.0–34.0)
MCHC: 32.8 g/dL (ref 30.0–36.0)
MCV: 89.5 fL (ref 78.0–100.0)
MONO ABS: 0.8 10*3/uL (ref 0.1–1.0)
Monocytes Relative: 7 %
Neutro Abs: 10.7 10*3/uL — ABNORMAL HIGH (ref 1.7–7.7)
Neutrophils Relative %: 84 %
PLATELETS: 437 10*3/uL — AB (ref 150–400)
RBC: 5.42 MIL/uL (ref 4.22–5.81)
RDW: 14.9 % (ref 11.5–15.5)
WBC: 12.6 10*3/uL — ABNORMAL HIGH (ref 4.0–10.5)

## 2016-11-01 LAB — BASIC METABOLIC PANEL
ANION GAP: 8 (ref 5–15)
BUN: 40 mg/dL — ABNORMAL HIGH (ref 6–20)
CALCIUM: 9.7 mg/dL (ref 8.9–10.3)
CO2: 27 mmol/L (ref 22–32)
Chloride: 120 mmol/L — ABNORMAL HIGH (ref 101–111)
Creatinine, Ser: 1.28 mg/dL — ABNORMAL HIGH (ref 0.61–1.24)
GFR, EST AFRICAN AMERICAN: 56 mL/min — AB (ref >60)
GFR, EST NON AFRICAN AMERICAN: 48 mL/min — AB (ref >60)
Glucose, Bld: 122 mg/dL — ABNORMAL HIGH (ref 65–99)
Potassium: 3.8 mmol/L (ref 3.5–5.1)
SODIUM: 155 mmol/L — AB (ref 135–145)

## 2016-11-01 LAB — INFLUENZA PANEL BY PCR (TYPE A & B)
Influenza A By PCR: NEGATIVE
Influenza B By PCR: NEGATIVE

## 2016-11-01 LAB — BRAIN NATRIURETIC PEPTIDE: B Natriuretic Peptide: 68.5 pg/mL (ref 0.0–100.0)

## 2016-11-01 LAB — TROPONIN I: Troponin I: 0.07 ng/mL

## 2016-11-01 LAB — TSH: TSH: 1.98 u[IU]/mL (ref 0.350–4.500)

## 2016-11-01 MED ORDER — VANCOMYCIN HCL IN DEXTROSE 750-5 MG/150ML-% IV SOLN
750.0000 mg | INTRAVENOUS | Status: DC
  Administered 2016-11-02 – 2016-11-03 (×2): 750 mg via INTRAVENOUS
  Filled 2016-11-01 (×2): qty 150

## 2016-11-01 MED ORDER — PIPERACILLIN-TAZOBACTAM 3.375 G IVPB
3.3750 g | Freq: Once | INTRAVENOUS | Status: AC
  Administered 2016-11-01: 3.375 g via INTRAVENOUS
  Filled 2016-11-01: qty 50

## 2016-11-01 MED ORDER — CO Q 10 100 MG PO CAPS: 1.0000 | ORAL_CAPSULE | Freq: Every day | ORAL | Status: DC

## 2016-11-01 MED ORDER — AZITHROMYCIN 500 MG IV SOLR: 500.0000 mg | Freq: Once | INTRAVENOUS | Status: DC

## 2016-11-01 MED ORDER — SODIUM CHLORIDE 0.9% FLUSH: 3.0000 mL | INTRAVENOUS | Status: DC | PRN

## 2016-11-01 MED ORDER — VITAMIN B-12 1000 MCG PO TABS
1000.0000 ug | ORAL_TABLET | Freq: Every morning | ORAL | Status: DC
  Administered 2016-11-02 – 2016-11-03 (×2): 1000 ug via ORAL
  Filled 2016-11-01 (×2): qty 1

## 2016-11-01 MED ORDER — SODIUM CHLORIDE 0.9% FLUSH: 3.0000 mL | Freq: Two times a day (BID) | INTRAVENOUS | Status: DC

## 2016-11-01 MED ORDER — SODIUM CHLORIDE 0.9 % IV SOLN: 250.0000 mL | INTRAVENOUS | Status: DC | PRN

## 2016-11-01 MED ORDER — DEXTROSE 5 % IV SOLN: 1.0000 g | Freq: Once | INTRAVENOUS | Status: DC

## 2016-11-01 MED ORDER — PIPERACILLIN SOD-TAZOBACTAM SO 2.25 (2-0.25) G IV SOLR
3.3750 g | Freq: Three times a day (TID) | INTRAVENOUS | Status: DC
  Administered 2016-11-01 – 2016-11-03 (×6): 3.375 g via INTRAVENOUS
  Filled 2016-11-01 (×8): qty 3.38

## 2016-11-01 MED ORDER — VANCOMYCIN HCL IN DEXTROSE 1-5 GM/200ML-% IV SOLN
1000.0000 mg | Freq: Once | INTRAVENOUS | Status: AC
  Administered 2016-11-01: 1000 mg via INTRAVENOUS
  Filled 2016-11-01: qty 200

## 2016-11-01 MED ORDER — PANTOPRAZOLE SODIUM 40 MG PO TBEC
40.0000 mg | DELAYED_RELEASE_TABLET | Freq: Every day | ORAL | Status: DC
  Administered 2016-11-02 – 2016-11-05 (×4): 40 mg via ORAL
  Filled 2016-11-01 (×4): qty 1

## 2016-11-01 MED ORDER — CALCIUM CARBONATE-VITAMIN D 500-200 MG-UNIT PO TABS
2.0000 | ORAL_TABLET | Freq: Two times a day (BID) | ORAL | Status: DC
  Administered 2016-11-02 – 2016-11-05 (×7): 2 via ORAL
  Filled 2016-11-01 (×15): qty 2

## 2016-11-01 MED ORDER — LACTATED RINGERS IV BOLUS (SEPSIS)
1000.0000 mL | Freq: Once | INTRAVENOUS | Status: AC
  Administered 2016-11-01: 1000 mL via INTRAVENOUS

## 2016-11-01 MED ORDER — CITALOPRAM HYDROBROMIDE 20 MG PO TABS
20.0000 mg | ORAL_TABLET | Freq: Every day | ORAL | Status: DC
  Administered 2016-11-02 – 2016-11-04 (×3): 20 mg via ORAL
  Filled 2016-11-01 (×4): qty 1

## 2016-11-01 MED ORDER — PIPERACILLIN-TAZOBACTAM 3.375 G IVPB
3.3750 g | Freq: Three times a day (TID) | INTRAVENOUS | Status: DC
  Filled 2016-11-01 (×2): qty 50

## 2016-11-01 MED ORDER — SODIUM CHLORIDE 0.9% FLUSH
3.0000 mL | Freq: Two times a day (BID) | INTRAVENOUS | Status: DC
  Administered 2016-11-04: 10:00:00 via INTRAVENOUS

## 2016-11-01 MED ORDER — HYDROCODONE-ACETAMINOPHEN 5-325 MG PO TABS: 1.0000 | ORAL_TABLET | ORAL | Status: DC | PRN

## 2016-11-01 MED ORDER — ACETAMINOPHEN 325 MG PO TABS
650.0000 mg | ORAL_TABLET | Freq: Two times a day (BID) | ORAL | Status: DC
  Administered 2016-11-02 – 2016-11-05 (×7): 650 mg via ORAL
  Filled 2016-11-01 (×8): qty 2

## 2016-11-01 MED ORDER — HEPARIN SODIUM (PORCINE) 5000 UNIT/ML IJ SOLN
5000.0000 [IU] | Freq: Three times a day (TID) | INTRAMUSCULAR | Status: DC
  Administered 2016-11-02 – 2016-11-05 (×11): 5000 [IU] via SUBCUTANEOUS
  Filled 2016-11-01 (×12): qty 1

## 2016-11-01 MED ORDER — VANCOMYCIN HCL 10 G IV SOLR
1250.0000 mg | Freq: Once | INTRAVENOUS | Status: DC
  Filled 2016-11-01: qty 1250

## 2016-11-01 MED ORDER — ONDANSETRON 4 MG PO TBDP: 4.0000 mg | ORAL_TABLET | Freq: Three times a day (TID) | ORAL | Status: DC | PRN

## 2016-11-01 MED ORDER — DEXTROSE-NACL 5-0.45 % IV SOLN
INTRAVENOUS | Status: DC
  Administered 2016-11-01: 20:00:00 via INTRAVENOUS
  Administered 2016-11-02: 125 mL/h via INTRAVENOUS

## 2016-11-01 MED ORDER — ALPRAZOLAM 0.5 MG PO TABS
0.5000 mg | ORAL_TABLET | Freq: Four times a day (QID) | ORAL | Status: DC | PRN
  Filled 2016-11-01: qty 1

## 2016-11-01 MED ORDER — MEMANTINE HCL 10 MG PO TABS
10.0000 mg | ORAL_TABLET | Freq: Two times a day (BID) | ORAL | Status: DC
  Administered 2016-11-02 (×2): 10 mg via ORAL
  Filled 2016-11-01 (×3): qty 1

## 2016-11-01 NOTE — Progress Notes (Signed)
1518, Star, HCAP. Tropnin result from 11/01/16 2046 is 0.07.  Thanks.  He's on Tele.SB 54.  Notified on call NP.

## 2016-11-01 NOTE — ED Notes (Addendum)
Two low-volume b.c. Obtained.

## 2016-11-01 NOTE — ED Provider Notes (Addendum)
[facility] DEPT Provider Note   CSN: [id number] Arrival date & time: 11/01/16  1359     History   Chief Complaint Chief Complaint  Patient presents with  . Fall    HPI CHANC BERKES is a 83 y.o. male.  The history is provided by the nursing home.  Fall  This is a new problem. The current episode started 6 to 12 hours ago. The problem occurs constantly. The problem has not changed since onset.Associated symptoms comments: Slumped to floor from chair. Nothing aggravates the symptoms. Nothing relieves the symptoms. He has tried nothing for the symptoms.    Past Medical History:  Diagnosis Date  . Cancer [facility])    Prostate  . Dementia    Alzheimers  . GERD (gastroesophageal reflux disease)   . Heart murmur    systolic murmur no further eval d/t Alzheimers dx  . Hematuria   . Stroke [facility])    TIA    Patient Active Problem List   Diagnosis Date Noted  . Hematuria 11/07/2014  . Protein-calorie malnutrition, severe ([place]) 08/18/2014  . Diaphoresis 08/17/2014  . Near syncope 08/17/2014  . Sinus bradycardia 08/17/2014  . Dementia 08/17/2014  . Pulmonary nodule 08/17/2014    Past Surgical History:  Procedure Laterality Date  . BALLOON DILATION [name] 07/03/2014   Procedure: BALLOON DILATION;  Surgeon: Garlan Fair, MD;  Location: [facility];  Service: Endoscopy;  Laterality: [name];  . BALLOON DILATION [name] 06/03/2015   Procedure: BALLOON DILATION;  Surgeon: Garlan Fair, MD;  Location: [facility];  Service: Endoscopy;  Laterality: [name];  . CYSTOSCOPY [name] 11/07/2014   Procedure: CYSTOSCOPY;  Surgeon: Alexis Frock, MD;  Location: [facility];  Service: Urology;  Laterality: [name];  . ESOPHAGOGASTRODUODENOSCOPY [name] 07/03/2014   Procedure: ESOPHAGOGASTRODUODENOSCOPY (EGD);  Surgeon: Garlan Fair, MD;  Location: [facility];  Service: Endoscopy;  Laterality: [name];  . ESOPHAGOGASTRODUODENOSCOPY (EGD) WITH PROPOFOL [name] 06/03/2015   Procedure: ESOPHAGOGASTRODUODENOSCOPY (EGD)  WITH PROPOFOL;  Surgeon: Garlan Fair, MD;  [facility];  Service: Endoscopy;  Laterality: [name];  . HERNIA REPAIR  1970   double  . seed implant for prostate cancer  2006  . TRANSURETHRAL RESECTION OF PROSTATE [name] 11/07/2014   Procedure: TRANSURETHRAL VAPORIZATION OF THE PROSTATE AND BLADDER LESIONS WITH ERBE (NEW GYRUS) REMOVAL OF BLADDER STONE; PROSTATIC URETHRA BIOPSY;  Surgeon: Alexis Frock, MD;  Location: [facility];  Service: Urology;  Laterality: [name];       Home Medications    Prior to Admission medications   Medication Sig Start Date End Date Taking? Authorizing Provider  acetaminophen (TYLENOL) 325 MG tablet Take 650 mg by mouth 3 (three) times daily.    Historical Provider, MD  ALPRAZolam Duanne Moron) 0.5 MG tablet Take 0.5 mg by mouth every 6 (six) hours as needed (for aggitation).     Historical Provider, MD  azithromycin (ZITHROMAX Z-PAK) 250 MG tablet Take 1 tablet (250 mg total) by mouth daily. 1 tablet daily for four days starting on 10/22/16. 10/21/16   Virgel Manifold, MD  Calcium-Vitamin D-Vitamin K (VIACTIV) [id number] MG-UNT-MCG CHEW Chew 2 tablets by mouth 2 (two) times daily.    Historical Provider, MD  cephALEXin (KEFLEX) 500 MG capsule Take 1 capsule (500 mg total) by mouth 3 (three) times daily. 10/21/16   Virgel Manifold, MD  citalopram (CELEXA) 20 MG tablet Take 20 mg by mouth at bedtime.    Historical Provider, MD  Coenzyme Q10 (CO Q 10) 100 MG CAPS Take 1 capsule by  mouth daily with breakfast.     Historical Provider, MD  cyanocobalamin 1000 MCG tablet Take 1,000 mcg by mouth every morning.     Historical Provider, MD  memantine (NAMENDA) 10 MG tablet Take 10 mg by mouth 2 (two) times daily.     Historical Provider, MD  omeprazole (PRILOSEC) 20 MG capsule Take 20 mg by mouth daily before breakfast.     Historical Provider, MD  ondansetron (ZOFRAN ODT) 4 MG disintegrating tablet Take 1 tablet (4 mg total) by mouth every 8 (eight) hours as needed for nausea or  vomiting. Patient not taking: Reported on 10/21/2016 10/18/16   Clifton James, MD  ondansetron [facility]) 4 MG tablet Take 1 tablet (4 mg total) by mouth every 8 (eight) hours as needed for nausea or vomiting. 10/21/16   Virgel Manifold, MD  Red Yeast Rice 600 MG CAPS Take 1,200 mg by mouth daily with breakfast.    Historical Provider, MD    Family History Family History  Problem Relation Age of Onset  . Heart disease Mother     Social History Social History  Substance Use Topics  . Smoking status: Former Smoker    Packs/day: 1.00    Years: 10.00    Types: Cigarettes    Quit date: 08/31/1968  . Smokeless tobacco: Never Used  . Alcohol use No     Allergies   Patient has no known allergies.   Review of Systems Review of Systems  Unable to perform ROS: Dementia   Level 5 exception to history: dementia   Physical Exam Updated Vital Signs BP 156/80 (BP Location: Left Arm)   Pulse 74   Temp 98.1 F (36.7 C) (Oral)   Resp 16   SpO2 97%   Physical Exam  Constitutional: He appears lethargic. He appears ill (chronically). No distress.  HENT:  Head: Normocephalic. Head is with abrasion (over right eye).    Nose: Nose normal.  Eyes: Conjunctivae are normal.  Neck: Neck supple. No tracheal deviation present.  Cardiovascular: Normal rate and regular rhythm.   Pulmonary/Chest: Effort normal. No respiratory distress.  Abdominal: Soft. He exhibits no distension.  Neurological: He appears lethargic. He is disoriented.  Skin: Skin is warm and dry.  Psychiatric: His affect is blunt.  Minimal speech, will respond when asked name     ED Treatments / Results  Labs (all labs ordered are listed, but only abnormal results are displayed) Labs Reviewed  CBC WITH DIFFERENTIAL/PLATELET - Abnormal; Notable for the following:       Result Value   WBC 12.6 (*)    Platelets 437 (*)    Neutro Abs 10.7 (*)    All other components within normal limits  BASIC METABOLIC PANEL -  Abnormal; Notable for the following:    Sodium 155 (*)    Chloride 120 (*)    Glucose, Bld 122 (*)    BUN 40 (*)    Creatinine, Ser 1.28 (*)    GFR calc non Af Amer 48 (*)    GFR calc Af Amer 56 (*)    All other components within normal limits  CULTURE, BLOOD (ROUTINE X 2)  CULTURE, BLOOD (ROUTINE X 2)  URINALYSIS, ROUTINE W REFLEX MICROSCOPIC    EKG  EKG Interpretation None       Radiology Dg Chest Port 1 View  Result Date: 11/01/2016 CLINICAL DATA:  AMS x 2 weeks; hx heart murmur per chart EXAM: PORTABLE CHEST 1 VIEW COMPARISON:  10/21/2016 FINDINGS: Heart size  is normal. Aorta is tortuous. There is patchy infiltrate in the medial right lung base, consistent with infectious process. No pulmonary edema. IMPRESSION: Right lower lobe infiltrate. Electronically Signed   By: Nolon Nations M.D.   On: 11/01/2016 15:23    Procedures Procedures (including critical care time)  Medications Ordered in ED Medications  lactated ringers bolus 1,000 mL (1,000 mLs Intravenous New Bag/Given 11/01/16 1639)  vancomycin (VANCOCIN) IVPB 1000 mg/200 mL premix (1,000 mg Intravenous New Bag/Given 11/01/16 1721)  piperacillin-tazobactam (ZOSYN) IVPB 3.375 g (0 g Intravenous Stopped 11/01/16 1721)     Initial Impression / Assessment and Plan / ED Course  I have reviewed the triage vital signs and the nursing notes.  Pertinent labs & imaging results that were available during my care of the patient were reviewed by me and considered in my medical decision making (see chart for details).     83 year old male presents from [facility] after sitting in the chair by his bed and being found by a nursing assistant lying in the floor with a small abrasion above his right eye. Patient is not anticoagulated. He is moving all 4 extremities. He is able to tell me his name but is grossly disoriented which is apparently his baseline. I spoke with a nurse at [facility] who tells me he recently completed a  course of antibiotics for pneumonia but ever since being diagnosed he has had a progressive decline that has not improved. I explained to them that I did not see any utility for CT imaging of the head or neck today given his minimal signs of trauma but would be happy to screen him for recurrent pneumonia, urinary tract infection or metabolic disturbance that might explain his symptoms.  CBC, BMP, UA and chest x-ray ordered for screening.  Labs and appearance suggest severe dehydration. Neutrophilic leukocytosis and RLL infiltrate noted on XR suggestive of failure of outpatient therapy for pneumonia. Pt choking on sips of water here so may be a component of aspiration. Will escalate to HCAP ABx and admit for IVF rehydration and monitoring.   Final Clinical Impressions(s) / ED Diagnoses   Final diagnoses:  Dementia without behavioral disturbance, unspecified dementia type  Abrasion of forehead, initial encounter  Severe dehydration  HCAP (healthcare-associated pneumonia)    New Prescriptions New Prescriptions   No medications on file     Leo Grosser, MD 11/01/16 [place], MD 11/01/16 1729

## 2016-11-01 NOTE — ED Notes (Signed)
I have just given report to Teresa, RN; and will transport now. 

## 2016-11-01 NOTE — ED Triage Notes (Signed)
Staff at [facility] found pt. In his rm. On the floor on his right side. He is in no distress; and is at his baseline according to his nurses. He has an abrasion near lat. Right orbit/eyebrow area. He is minimally verbal. PEARL at 29mm.

## 2016-11-01 NOTE — Progress Notes (Signed)
Pharmacy Antibiotic Note  [name] is a 83 y.o. male admitted on 11/01/2016 with HCAP.  Pharmacy has been consulted for vancomycin and Zosyn dosing.  Plan:  Vancomycin 1000 mg IV now, then 750 mg IV q24 hr; goal trough 15-20 mcg/mL  Measure vancomycin trough levels at steady state as indicated  Zosyn 3.375 g IV given once over 30 minutes, then every 8 hrs by 4-hr infusion  Daily SCr      Temp (24hrs), Avg:98.1 F (36.7 C), Min:98.1 F (36.7 C), Max:98.1 F (36.7 C)   Recent Labs Lab 11/01/16 1521  WBC 12.6*  CREATININE 1.28*    CrCl cannot be calculated (Unknown ideal weight.).    No Known Allergies    Thank you for allowing pharmacy to be a part of this patient's care.  Reuel Boom, PharmD, BCPS Pager: [phone] 11/01/2016, 8:37 PM

## 2016-11-01 NOTE — ED Notes (Signed)
Note: two of our staff, including our C.N., Patty attampted in and out cath.--unable to obtain.

## 2016-11-01 NOTE — H&P (Signed)
History and Physical    [name] [id number] DOB: [birth year] DOA: 11/01/2016  Referring MD/NP/PA:  PCP: Mathews Argyle, MD  Outpatient Specialists:   Patient coming from: [place] facility Chief Complaint: Fall  HPI: [name] is a 83 y.o. male with medical history significant for but not limited to dementia who was sent to [name] ED from [place] facility on account of a fall.  He had recently completed antibiotic the facility for pneumonia. Staff indicates that since he had pneumonia he had not been himself and has been progressively worsening in terms of his mental status with decreased oral intake. No history of fever or chills or nausea or vomiting reported.  Patient is able to give any history at this time on account of his mental status change. ED Course: At the ED patient was noted to be hemodynamically stable but had very dry mucous membranes with x-ray finding of right lower lobe infiltrate. Labs indicated elevated BUN/creatinine with hypernatremia as well as mild leukocytosis of 12.6k.  Antibiotic initiated for age HCAP and hospitalist consulted for admission  Review of Systems: Unable to volunteer due to mental status change  Past Medical History:  Diagnosis Date  . Cancer [facility])    Prostate  . Dementia    Alzheimers  . GERD (gastroesophageal reflux disease)   . Heart murmur    systolic murmur no further eval d/t Alzheimers dx  . Hematuria   . Stroke [facility])    TIA    Past Surgical History:  Procedure Laterality Date  . BALLOON DILATION [name] 07/03/2014   Procedure: BALLOON DILATION;  Surgeon: Garlan Fair, MD;  Location: [facility];  Service: Endoscopy;  Laterality: [name];  . BALLOON DILATION [name] 06/03/2015   Procedure: BALLOON DILATION;  Surgeon: Garlan Fair, MD;  [facility];  Service: Endoscopy;  Laterality: [name];  . CYSTOSCOPY [name] 11/07/2014   Procedure: CYSTOSCOPY;  Surgeon: Alexis Frock, MD;   [facility];  Service: Urology;  Laterality: [name];  . ESOPHAGOGASTRODUODENOSCOPY [name] 07/03/2014   Procedure: ESOPHAGOGASTRODUODENOSCOPY (EGD);  Surgeon: Garlan Fair, MD;  Location: [facility];  Service: Endoscopy;  Laterality: [name];  . ESOPHAGOGASTRODUODENOSCOPY (EGD) WITH PROPOFOL [name] 06/03/2015   Procedure: ESOPHAGOGASTRODUODENOSCOPY (EGD) WITH PROPOFOL;  Surgeon: Garlan Fair, MD;  [facility];  Service: Endoscopy;  Laterality: [name];  . HERNIA REPAIR  1970   double  . seed implant for prostate cancer  2006  . TRANSURETHRAL RESECTION OF PROSTATE [name] 11/07/2014   Procedure: TRANSURETHRAL VAPORIZATION OF THE PROSTATE AND BLADDER LESIONS WITH ERBE (NEW GYRUS) REMOVAL OF BLADDER STONE; PROSTATIC URETHRA BIOPSY;  Surgeon: Alexis Frock, MD;  [facility];  Service: Urology;  Laterality: [name];     reports that he quit smoking about 48 years ago. His smoking use included Cigarettes. He has a 10.00 pack-year smoking history. He has never used smokeless tobacco. He reports that he does not drink alcohol or use drugs.  No Known Allergies  Family History  Problem Relation Age of Onset  . Heart disease Mother    )  Prior to Admission medications   Medication Sig Start Date End Date Taking? Authorizing Provider  acetaminophen (TYLENOL) 325 MG tablet Take 650 mg by mouth 2 (two) times daily.    Yes Historical Provider, MD  ALPRAZolam Duanne Moron) 0.5 MG tablet Take 0.5 mg by mouth every 6 (six) hours as needed (for aggitation).    Yes Historical Provider, MD  Calcium-Vitamin D-Vitamin K (VIACTIV) [id number] MG-UNT-MCG  CHEW Chew 2 tablets by mouth 2 (two) times daily.   Yes Historical Provider, MD  citalopram (CELEXA) 20 MG tablet Take 20 mg by mouth at bedtime.   Yes Historical Provider, MD  Coenzyme Q10 (CO Q 10) 100 MG CAPS Take 1 capsule by mouth daily with breakfast.    Yes Historical Provider, MD  cyanocobalamin 1000 MCG tablet Take 1,000 mcg by mouth every morning.    Yes  Historical Provider, MD  memantine (NAMENDA) 10 MG tablet Take 10 mg by mouth 2 (two) times daily.    Yes Historical Provider, MD  omeprazole (PRILOSEC) 20 MG capsule Take 20 mg by mouth daily before breakfast.    Yes Historical Provider, MD  Red Yeast Rice 600 MG CAPS Take 1,200 mg by mouth daily with breakfast.   Yes Historical Provider, MD  azithromycin (ZITHROMAX Z-PAK) 250 MG tablet Take 1 tablet (250 mg total) by mouth daily. 1 tablet daily for four days starting on 10/22/16. Patient not taking: Reported on 11/01/2016 10/21/16   Virgel Manifold, MD  cephALEXin (KEFLEX) 500 MG capsule Take 1 capsule (500 mg total) by mouth 3 (three) times daily. Patient not taking: Reported on 11/01/2016 10/21/16   Virgel Manifold, MD  ondansetron (ZOFRAN ODT) 4 MG disintegrating tablet Take 1 tablet (4 mg total) by mouth every 8 (eight) hours as needed for nausea or vomiting. Patient not taking: Reported on 10/21/2016 10/18/16   Clifton James, MD  ondansetron (ZOFRAN) 4 MG tablet Take 1 tablet (4 mg total) by mouth every 8 (eight) hours as needed for nausea or vomiting. Patient not taking: Reported on 11/01/2016 10/21/16   Virgel Manifold, MD    Physical Exam: Vitals:   11/01/16 1621 11/01/16 1630 11/01/16 1639 11/01/16 1648  BP:   151/68   Pulse: (!) 59 78 61 62  Resp:      Temp:      TempSrc:      SpO2: 96% (!) 80% 97% 99%      Constitutional: NAD, calm, comfortable Vitals:   11/01/16 1621 11/01/16 1630 11/01/16 1639 11/01/16 1648  BP:   151/68   Pulse: (!) 59 78 61 62  Resp:      Temp:      TempSrc:      SpO2: 96% (!) 80% 97% 99%   Eyes: PERRL, lids and conjunctivae normal ENMT: Dry Mucous membranes. Posterior pharynx clear of any exudate or lesions.  Neck: normal, supple, no masses, no thyromegaly Respiratory: Diminished breath sounds right lower lobe. Normal respiratory effort. No accessory muscle use.  Cardiovascular: Regular rate and rhythm, no murmurs / rubs / gallops. No extremity edema.  2+ pedal pulses. No carotid bruits.  Abdomen: no tenderness, no masses palpated. No hepatosplenomegaly. Bowel sounds positive.  Musculoskeletal: no clubbing / cyanosis.  Skin: no rashes, lesions No induration Neurologic: Lethargic but arousable, obeys simple commands intermittently, no obvious acute focal deficit noted      Labs on Admission: I have personally reviewed following labs and imaging studies  CBC:  Recent Labs Lab 11/01/16 1521  WBC 12.6*  NEUTROABS 10.7*  HGB 15.9  HCT 48.5  MCV 89.5  PLT 99991111*   Basic Metabolic Panel:  Recent Labs Lab 11/01/16 1521  NA 155*  K 3.8  CL 120*  CO2 27  GLUCOSE 122*  BUN 40*  CREATININE 1.28*  CALCIUM 9.7   GFR: CrCl cannot be calculated (Unknown ideal weight.). Liver Function Tests: No results for input(s): AST, ALT, ALKPHOS, BILITOT, PROT,  ALBUMIN in the last 168 hours. No results for input(s): LIPASE, AMYLASE in the last 168 hours. No results for input(s): AMMONIA in the last 168 hours. Coagulation Profile: No results for input(s): INR, PROTIME in the last 168 hours. Cardiac Enzymes: No results for input(s): CKTOTAL, CKMB, CKMBINDEX, TROPONINI in the last 168 hours. BNP (last 3 results) No results for input(s): PROBNP in the last 8760 hours. HbA1C: No results for input(s): HGBA1C in the last 72 hours. CBG: No results for input(s): GLUCAP in the last 168 hours. Lipid Profile: No results for input(s): CHOL, HDL, LDLCALC, TRIG, CHOLHDL, LDLDIRECT in the last 72 hours. Thyroid Function Tests: No results for input(s): TSH, T4TOTAL, FREET4, T3FREE, THYROIDAB in the last 72 hours. Anemia Panel: No results for input(s): VITAMINB12, FOLATE, FERRITIN, TIBC, IRON, RETICCTPCT in the last 72 hours. Urine analysis:    Component Value Date/Time   COLORURINE YELLOW 10/21/2016 1446   APPEARANCEUR HAZY (A) 10/21/2016 1446   LABSPEC 1.024 10/21/2016 1446   PHURINE 5.0 10/21/2016 1446   GLUCOSEU NEGATIVE 10/21/2016 1446    HGBUR LARGE (A) 10/21/2016 1446   BILIRUBINUR NEGATIVE 10/21/2016 1446   KETONESUR 20 (A) 10/21/2016 1446   PROTEINUR 30 (A) 10/21/2016 1446   UROBILINOGEN 1.0 10/20/2014 1332   NITRITE NEGATIVE 10/21/2016 1446   LEUKOCYTESUR SMALL (A) 10/21/2016 1446   Sepsis Labs: @LABRCNTIP (procalcitonin:4,lacticidven:4) )No results found for this or any previous visit (from the past 240 hour(s)).   Radiological Exams on Admission: Dg Chest Port 1 View  Result Date: 11/01/2016 CLINICAL DATA:  AMS x 2 weeks; hx heart murmur per chart EXAM: PORTABLE CHEST 1 VIEW COMPARISON:  10/21/2016 FINDINGS: Heart size is normal. Aorta is tortuous. There is patchy infiltrate in the medial right lung base, consistent with infectious process. No pulmonary edema. IMPRESSION: Right lower lobe infiltrate. Electronically Signed   By: Nolon Nations M.D.   On: 11/01/2016 15:23    EKG: Independently reviewed.   Assessment/Plan Principal Problem:   HCAP (healthcare-associated pneumonia) Active Problems:   Dehydration   Hypernatremia   Acute renal insufficiency   Altered mental status  #1 HCAP: IV antibiotics Oxygen supplementation Follow-up cultures-blood, sputum, urine Follow-up flu PCR,   #2 Dehydration: Due to poor intake IV rehydration  #3 Hyponatremia: Hypotonic fluid administration Monitor renal function and electrolytes  #4 Acute Renal Insufficiency: Due to dehydration with poor oral intake IV rehydration Follow renal function with electrolytes  #5 Altered Mental Status: Toxic-metabolic due to above diagnosis Supportive care Oral care PT/OT to follow when clinically improved with his mental status    DVT prophylaxis: (Heparin) Code Status: (DNR) Family Communication:  Disposition Plan: SNF Consults called:  Admission status: (inpatient / tele)   OSEI-BONSU,Smita Lesh MD [facility] Pager [phone]  If 7PM-7AM, please contact night-coverage [email or web address] Password  [email or web address]  11/01/2016, 5:08 PM

## 2016-11-02 LAB — URINALYSIS, ROUTINE W REFLEX MICROSCOPIC
BILIRUBIN URINE: NEGATIVE
GLUCOSE, UA: NEGATIVE mg/dL
Ketones, ur: NEGATIVE mg/dL
Nitrite: NEGATIVE
PROTEIN: 30 mg/dL — AB
Specific Gravity, Urine: 1.023 (ref 1.005–1.030)
pH: 5 (ref 5.0–8.0)

## 2016-11-02 LAB — COMPREHENSIVE METABOLIC PANEL
ALBUMIN: 2.8 g/dL — AB (ref 3.5–5.0)
ALT: 21 U/L (ref 17–63)
ANION GAP: 8 (ref 5–15)
AST: 48 U/L — ABNORMAL HIGH (ref 15–41)
Alkaline Phosphatase: 70 U/L (ref 38–126)
BILIRUBIN TOTAL: 1.7 mg/dL — AB (ref 0.3–1.2)
BUN: 32 mg/dL — ABNORMAL HIGH (ref 6–20)
CO2: 27 mmol/L (ref 22–32)
Calcium: 9.1 mg/dL (ref 8.9–10.3)
Chloride: 119 mmol/L — ABNORMAL HIGH (ref 101–111)
Creatinine, Ser: 1.25 mg/dL — ABNORMAL HIGH (ref 0.61–1.24)
GFR calc Af Amer: 57 mL/min — ABNORMAL LOW (ref >60)
GFR calc non Af Amer: 50 mL/min — ABNORMAL LOW (ref >60)
GLUCOSE: 124 mg/dL — AB (ref 65–99)
Potassium: 3.4 mmol/L — ABNORMAL LOW (ref 3.5–5.1)
SODIUM: 154 mmol/L — AB (ref 135–145)
TOTAL PROTEIN: 6.3 g/dL — AB (ref 6.5–8.1)

## 2016-11-02 LAB — TROPONIN I
TROPONIN I: 0.06 ng/mL — AB (ref <0.03)
Troponin I: 0.06 ng/mL (ref <0.03)

## 2016-11-02 LAB — STREP PNEUMONIAE URINARY ANTIGEN: Strep Pneumo Urinary Antigen: NEGATIVE

## 2016-11-02 LAB — CBC
HEMATOCRIT: 46.5 % (ref 39.0–52.0)
HEMOGLOBIN: 15.4 g/dL (ref 13.0–17.0)
MCH: 29.6 pg (ref 26.0–34.0)
MCHC: 33.1 g/dL (ref 30.0–36.0)
MCV: 89.4 fL (ref 78.0–100.0)
Platelets: 338 10*3/uL (ref 150–400)
RBC: 5.2 MIL/uL (ref 4.22–5.81)
RDW: 14.6 % (ref 11.5–15.5)
WBC: 9.2 10*3/uL (ref 4.0–10.5)

## 2016-11-02 LAB — MRSA PCR SCREENING: MRSA by PCR: NEGATIVE

## 2016-11-02 MED ORDER — DEXTROSE 5 % IV SOLN
INTRAVENOUS | Status: DC
  Administered 2016-11-02 – 2016-11-05 (×6): via INTRAVENOUS

## 2016-11-02 MED ORDER — POTASSIUM CHLORIDE CRYS ER 20 MEQ PO TBCR
40.0000 meq | EXTENDED_RELEASE_TABLET | Freq: Once | ORAL | Status: AC
  Administered 2016-11-02: 40 meq via ORAL
  Filled 2016-11-02: qty 2

## 2016-11-02 MED ORDER — CHLORHEXIDINE GLUCONATE 0.12 % MT SOLN
15.0000 mL | Freq: Two times a day (BID) | OROMUCOSAL | Status: DC
  Administered 2016-11-02 – 2016-11-05 (×6): 15 mL via OROMUCOSAL
  Filled 2016-11-02 (×5): qty 15

## 2016-11-02 NOTE — Progress Notes (Signed)
PROGRESS NOTE    [place] BU[place]  [id number] DOB: 07-08-1926 DOA: 11/01/2016 PCP: Mathews Argyle, MD    Brief Narrative: [name] is a 83 y.o. male with medical history significant for but not limited to dementia who was sent to [name] ED from [place] facility on account of a fall.  He had recently completed antibiotic the facility for pneumonia. Staff indicates that since he had pneumonia he had not been himself and has been progressively worsening in terms of his mental status with decreased oral intake. No history of fever or chills or nausea or vomiting reported.  Patient is able to give any history at this time on account of his mental status change. ED Course: At the ED patient was noted to be hemodynamically stable but had very dry mucous membranes with x-ray finding of right lower lobe infiltrate. Labs indicated elevated BUN/creatinine with hypernatremia as well as mild leukocytosis of 12.6k.  Assessment & Plan:   Principal Problem:   HCAP (healthcare-associated pneumonia) Active Problems:   Dehydration   Hypernatremia   Acute renal insufficiency   Altered mental status  1-HCAP;  Continue with IV Antibiotics.  WBC trending down.  Influenza negative. Legionella pending. Strept pneumonia negative.  Needs swallow evaluation.  Blood culture pending.   2-Hypernatremia;  Change IV fluid to D 5.  Repeat labs in am.   3-Hypokalemia; replete   4-Mild elevation troponin; discussed with POA, they would not want to pursue Aggressive care, cath.[name]   5-Acute encephalopathy;  Patient non verbal, less interactive since 2 weeks ago per family.  Related to infection and or progression of dementia.  IV fluids.  Check B-12, TSH 1.98.  Check ammonia level.   DVT prophylaxis: Heparin.  Code Status: DNR  Family Communication: Care discussed POA.  Disposition Plan: remain inpatient.  Consultants:   none   Procedures: none   Antimicrobials:    Vancomycin  Zosyn    Subjective: Eyes open, non verbal.    Objective: Vitals:   11/01/16 1800 11/01/16 2116 11/02/16 0451 11/02/16 1630  BP: (!) 133/55 136/60 (!) 144/49 (!) 133/54  Pulse: 86 66 76 (!) 53  Resp: 18 18 20 16   Temp:  97.8 F (36.6 C) 97.8 F (36.6 C) 98.2 F (36.8 C)  TempSrc:  Oral Oral Oral  SpO2:  96% 100% 99%    Intake/Output Summary (Last 24 hours) at 11/02/16 1836 Last data filed at 11/02/16 0604  Gross per 24 hour  Intake           1187.5 ml  Output                0 ml  Net           1187.5 ml   There were no vitals filed for this visit.  Examination:  General exam:  lethargic , lying in bed in fetal position.  Respiratory system: Clear to auscultation. Respiratory effort normal. Cardiovascular system: S1 & S2 heard, RRR. No JVD, murmurs, rubs, gallops or clicks. No pedal edema. Gastrointestinal system: Abdomen is nondistended, soft and nontender. No organomegaly or masses felt. Normal bowel sounds heard. Central nervous system: Alert and oriented. No focal neurological deficits. Extremities; no edema     Data Reviewed: I have personally reviewed following labs and imaging studies  CBC:  Recent Labs Lab 11/01/16 1521 11/02/16 0617  WBC 12.6* 9.2  NEUTROABS 10.7*  --   HGB 15.9 15.4  HCT 48.5 46.5  MCV 89.5 89.4  PLT 437* [id number]   Basic Metabolic Panel:  Recent Labs Lab 11/01/16 1521 11/02/16 0617  NA 155* 154*  K 3.8 3.4*  CL 120* 119*  CO2 27 27  GLUCOSE 122* 124*  BUN 40* 32*  CREATININE 1.28* 1.25*  CALCIUM 9.7 9.1   GFR: CrCl cannot be calculated (Unknown ideal weight.). Liver Function Tests:  Recent Labs Lab 11/02/16 0617  AST 48*  ALT 21  ALKPHOS 70  BILITOT 1.7*  PROT 6.3*  ALBUMIN 2.8*   No results for input(s): LIPASE, AMYLASE in the last 168 hours. No results for input(s): AMMONIA in the last 168 hours. Coagulation Profile: No results for input(s): INR, PROTIME in the last 168 hours. Cardiac  Enzymes:  Recent Labs Lab 11/01/16 2046 11/02/16 0024 11/02/16 0617  TROPONINI 0.07* 0.06* 0.06*   BNP (last 3 results) No results for input(s): PROBNP in the last 8760 hours. HbA1C: No results for input(s): HGBA1C in the last 72 hours. CBG: No results for input(s): GLUCAP in the last 168 hours. Lipid Profile: No results for input(s): CHOL, HDL, LDLCALC, TRIG, CHOLHDL, LDLDIRECT in the last 72 hours. Thyroid Function Tests:  Recent Labs  11/01/16 2046  TSH 1.980   Anemia Panel: No results for input(s): VITAMINB12, FOLATE, FERRITIN, TIBC, IRON, RETICCTPCT in the last 72 hours. Sepsis Labs: No results for input(s): PROCALCITON, LATICACIDVEN in the last 168 hours.  Recent Results (from the past 240 hour(s))  Blood culture (routine x 2)     Status: None (Preliminary result)   Collection Time: 11/01/16  4:33 PM  Result Value Ref Range Status   Specimen Description BLOOD LEFT HAND  Final   Special Requests IN PEDIATRIC BOTTLE 1CC  Final   Culture   Final    NO GROWTH < 24 HOURS Performed at [facility], [place] [address] [place], [place] [address]    Report Status PENDING  Incomplete  Blood culture (routine x 2)     Status: None (Preliminary result)   Collection Time: 11/01/16  4:33 PM  Result Value Ref Range Status   Specimen Description BLOOD BLOOD RIGHT FOREARM  Final   Special Requests IN PEDIATRIC BOTTLE 3.5CC  Final   Culture   Final    NO GROWTH < 24 HOURS Performed at [facility], [place] [address], [place], [place] [address]    Report Status PENDING  Incomplete  Culture, blood (routine x 2) Call MD if unable to obtain prior to antibiotics being given     Status: None (Preliminary result)   Collection Time: 11/01/16  8:46 PM  Result Value Ref Range Status   Specimen Description BLOOD RIGHT HAND  Final   Special Requests IN PEDIATRIC BOTTLE 2CC  Final   Culture   Final    NO GROWTH < 24 HOURS Performed at [facility], [place] [address],  [place], [place] [address]    Report Status PENDING  Incomplete  Culture, blood (routine x 2) Call MD if unable to obtain prior to antibiotics being given     Status: None (Preliminary result)   Collection Time: 11/01/16  8:46 PM  Result Value Ref Range Status   Specimen Description BLOOD RIGHT HAND  Final   Special Requests IN PEDIATRIC BOTTLE [place]  Final   Culture   Final    NO GROWTH < 24 HOURS Performed at [facility], [place] [address] [place], [place] [address]    Report Status PENDING  Incomplete  MRSA  PCR Screening     Status: None   Collection Time: 11/02/16 10:44 AM  Result Value Ref Range Status   MRSA by PCR NEGATIVE NEGATIVE Final    Comment:        The GeneXpert MRSA Assay (FDA approved for NASAL specimens only), is one component of a comprehensive MRSA colonization surveillance program. It is not intended to diagnose MRSA infection nor to guide or monitor treatment for MRSA infections.          Radiology Studies: Dg Chest Port 1 View  Result Date: 11/01/2016 CLINICAL DATA:  AMS x 2 weeks; hx heart murmur per chart EXAM: PORTABLE CHEST 1 VIEW COMPARISON:  10/21/2016 FINDINGS: Heart size is normal. Aorta is tortuous. There is patchy infiltrate in the medial right lung base, consistent with infectious process. No pulmonary edema. IMPRESSION: Right lower lobe infiltrate. Electronically Signed   By: Nolon Nations M.D.   On: 11/01/2016 15:23        Scheduled Meds: . acetaminophen  650 mg Oral BID  . calcium-vitamin D  2 tablet Oral BID  . citalopram  20 mg Oral QHS  . heparin  5,000 Units Subcutaneous Q8H  . memantine  10 mg Oral BID  . pantoprazole  40 mg Oral Daily  . piperacillin-tazobactam (ZOSYN)  IV  3.375 g Intravenous Q8H  . sodium chloride flush  3 mL Intravenous Q12H  . sodium chloride flush  3 mL Intravenous Q12H  . vancomycin  750 mg Intravenous Q24H  . cyanocobalamin  1,000 mcg Oral q morning - 10a   Continuous Infusions: . dextrose 100  mL/hr at 11/02/16 1121     LOS: 1 day    Time spent: 35 minutes.     Elmarie Shiley, MD [facility] Pager [phone]  If 7PM-7AM, please contact night-coverage [email or web address] Password [facility] 11/02/2016, 6:36 PM

## 2016-11-02 NOTE — Clinical Social Work Note (Signed)
Clinical Social Work Assessment  Patient Details  Name: [name] MRN: [id number] Date of Birth: 10/06/1925  Date of referral:  11/02/16               Reason for consult:  Facility Placement                Permission sought to share information with:  Facility Sport and exercise psychologist, Case Manager, Family Supports, Other ([place]) Permission granted to share information::     Name::     [place]   Agency::  [employer]  Relationship::  HCPOA  Contact Information:  [phone]  Housing/Transportation Living arrangements for the past 2 months:  [facility]) Source of Information:  Power of Attorney Patient Interpreter Needed:  None Criminal Activity/Legal Involvement Pertinent to Current Situation/Hospitalization:  No - Comment as needed Significant Relationships:  Other Family Members Lives with:  Facility Resident Do you feel safe going back to the place where you live?  Yes Need for family participation in patient care:  Yes (Comment)  Care giving concerns:  Patient HCPOA does not feel patient can continue to manage at assisted living facility.    Social Worker assessment / plan:  LCSWA spoke with patient  HCPOA-Donna about patient disposition. She expressed her concerns with patient current state of health and feels he will need a higher level of care at discharge. She reports she helps to assist with patient and pt. Wife medical and financial needs. LCSWA explained role and SNF intervention/process, three night qualifying stay, PT and OT have been consults have been placed, at this time waiting for their recommendations.. She reports understanding the process.  Plan: Assist with discharge placement. Complete FL2, PASRR, provide clinical information to SNF facilities and provide bed offers.  Employment status:  Retired Forensic scientist:  Medicare PT Recommendations:  Not assessed at this time Information / Referral to community resources:   [place]  Patient/Family's Response to care:  Agreeable  Patient/Family's Understanding of and Emotional Response to Diagnosis, Current Treatment, and Prognosis:  Patient HCPOA is very involved in patient care and requests to be updated with patient diagnosis and progress.   Emotional Assessment Appearance:  Developmentally appropriate Attitude/Demeanor/Rapport:    Affect (typically observed):    Orientation:   (Disoriented x4) Alcohol / Substance use:  Not Applicable Psych involvement (Current and /or in the community):     Discharge Needs  Concerns to be addressed:  Discharge Planning Concerns, Care Coordination Readmission within the last 30 days:  Yes Current discharge risk:  Dependent with Mobility Barriers to Discharge:  Continued Medical Work up   [employer], LCSW 11/02/2016, 2:42 PM

## 2016-11-03 LAB — BASIC METABOLIC PANEL
Anion gap: 7 (ref 5–15)
BUN: 19 mg/dL (ref 6–20)
CHLORIDE: 114 mmol/L — AB (ref 101–111)
CO2: 25 mmol/L (ref 22–32)
CREATININE: 1.18 mg/dL (ref 0.61–1.24)
Calcium: 8.7 mg/dL — ABNORMAL LOW (ref 8.9–10.3)
GFR calc Af Amer: >60 mL/min (ref >60)
GFR, EST NON AFRICAN AMERICAN: 53 mL/min — AB (ref >60)
Glucose, Bld: 99 mg/dL (ref 65–99)
Potassium: 3 mmol/L — ABNORMAL LOW (ref 3.5–5.1)
SODIUM: 146 mmol/L — AB (ref 135–145)

## 2016-11-03 LAB — CBC
HCT: 41.3 % (ref 39.0–52.0)
Hemoglobin: 13.1 g/dL (ref 13.0–17.0)
MCH: 28.7 pg (ref 26.0–34.0)
MCHC: 31.7 g/dL (ref 30.0–36.0)
MCV: 90.4 fL (ref 78.0–100.0)
PLATELETS: 288 10*3/uL (ref 150–400)
RBC: 4.57 MIL/uL (ref 4.22–5.81)
RDW: 14.6 % (ref 11.5–15.5)
WBC: 7.1 10*3/uL (ref 4.0–10.5)

## 2016-11-03 LAB — URINE CULTURE: Culture: NO GROWTH

## 2016-11-03 LAB — AMMONIA: AMMONIA: 49 umol/L — AB (ref 9–35)

## 2016-11-03 LAB — EXPECTORATED SPUTUM ASSESSMENT W REFEX TO RESP CULTURE

## 2016-11-03 LAB — GLUCOSE, CAPILLARY: Glucose-Capillary: 88 mg/dL (ref 65–99)

## 2016-11-03 LAB — MAGNESIUM: MAGNESIUM: 2 mg/dL (ref 1.7–2.4)

## 2016-11-03 LAB — LEGIONELLA PNEUMOPHILA SEROGP 1 UR AG: L. pneumophila Serogp 1 Ur Ag: NEGATIVE

## 2016-11-03 LAB — VITAMIN B12: Vitamin B-12: 2655 pg/mL — ABNORMAL HIGH (ref 180–914)

## 2016-11-03 LAB — HIV ANTIBODY (ROUTINE TESTING W REFLEX): HIV SCREEN 4TH GENERATION: NONREACTIVE

## 2016-11-03 MED ORDER — SODIUM CHLORIDE 0.9 % IV SOLN
30.0000 meq | Freq: Once | INTRAVENOUS | Status: AC
  Administered 2016-11-03: 30 meq via INTRAVENOUS
  Filled 2016-11-03: qty 15

## 2016-11-03 MED ORDER — POTASSIUM CHLORIDE CRYS ER 20 MEQ PO TBCR
40.0000 meq | EXTENDED_RELEASE_TABLET | Freq: Once | ORAL | Status: AC
  Administered 2016-11-03: 40 meq via ORAL
  Filled 2016-11-03: qty 2

## 2016-11-03 MED ORDER — LACTULOSE 10 GM/15ML PO SOLN
30.0000 g | Freq: Two times a day (BID) | ORAL | Status: DC
  Administered 2016-11-03: 30 g via ORAL
  Filled 2016-11-03: qty 45

## 2016-11-03 MED ORDER — PIPERACILLIN-TAZOBACTAM 3.375 G IVPB
3.3750 g | Freq: Three times a day (TID) | INTRAVENOUS | Status: DC
  Administered 2016-11-03 – 2016-11-05 (×6): 3.375 g via INTRAVENOUS
  Filled 2016-11-03 (×8): qty 50

## 2016-11-03 NOTE — Progress Notes (Signed)
Pt's had been bradycardic, lowest recorded was 39 which was non-sustained called in by [facility] ; ranges from 40-50's for the most part of the early morning. NP on call K. Schorr paged and notified of low pulse rate readings. Still awaiting any further order. Will continue to monitor.

## 2016-11-03 NOTE — Progress Notes (Addendum)
PROGRESS NOTE    [name]  [id number] DOB: 03-May-1926 DOA: 11/01/2016 PCP: Mathews Argyle, MD    Brief Narrative: [name] is a 83 y.o. male with medical history significant for but not limited to dementia who was sent to [name] ED from [name] facility on account of a fall.  He had recently completed antibiotic the facility for pneumonia. Staff indicates that since he had pneumonia he had not been himself and has been progressively worsening in terms of his mental status with decreased oral intake. No history of fever or chills or nausea or vomiting reported.  Patient is able to give any history at this time on account of his mental status change. ED Course: At the ED patient was noted to be hemodynamically stable but had very dry mucous membranes with x-ray finding of right lower lobe infiltrate. Labs indicated elevated BUN/creatinine with hypernatremia as well as mild leukocytosis of 12.6k.  Assessment & Plan:   Principal Problem:   HCAP (healthcare-associated pneumonia) Active Problems:   Dehydration   Hypernatremia   Acute renal insufficiency   Altered mental status  1-HCAP;  Continue with IV Antibiotics. Discontinue vancomycin, continue with sozyn.  WBC trending down.  Influenza negative. Legionella pending. Strept pneumonia negative.  Needs swallow evaluation.  Blood culture no growth to date.  2-Hypernatremia;  Change IV fluid to D 5.  Sodium level improving with IV fluids.   3-Hypokalemia; replete IV and oral.  Mg level normal.   4-Mild elevation troponin; discussed with POA, they would not want to pursue Aggressive care, cath.[name]   5-Acute encephalopathy;  Patient  Has been less interactive since 2 weeks ago per family.  Related to infection and or progression of dementia.  IV fluids.  Check B-12 elevated. , TSH 1.98.  ammonia level mildly elevated at 49. Will start lactulose.   6-Bradycardia; appears to be  asymptomatic. Patient denies chest pain,or dyspnea.   7-Dementia;  Patient very deconditioned. Less interactive for last 2 weeks per family.  He is at high risk for recurrent dehydration, readmission. Discussed with POA, will consult palliative care for goals of care.   DVT prophylaxis: Heparin.  Code Status: DNR  Family Communication: Care discussed POA 3-06 Disposition Plan: remain inpatient.  Consultants:   none   Procedures: none   Antimicrobials:  Vancomycin stopped 3-06 Zosyn    Subjective: He is less lethargic.  He was able to tell me his name today. Denies pain.   POA report;  patient gets lightheaded on standing in the past  Objective: Vitals:   11/02/16 1630 11/02/16 2115 11/03/16 0439 11/03/16 1412  BP: (!) 133/54 (!) 159/61 (!) 120/59 130/77  Pulse: (!) 53 (!) 51 (!) 49 (!) 54  Resp: 16 17 18 18   Temp: 98.2 F (36.8 C) 98.3 F (36.8 C) 97.4 F (36.3 C) 98.4 F (36.9 C)  TempSrc: Oral Oral Oral Oral  SpO2: 99% 99% 99%     Intake/Output Summary (Last 24 hours) at 11/03/16 1530 Last data filed at 11/03/16 1400  Gross per 24 hour  Intake              920 ml  Output              675 ml  Net              245 ml   There were no vitals filed for this visit.  Examination:  General exam: less  lethargic , lying in bed  in fetal position.  Respiratory system: Clear to auscultation. Respiratory effort normal. Cardiovascular system: S1 & S2 heard, RRR. No JVD, murmurs, rubs, gallops or clicks. No pedal edema. Gastrointestinal system: Abdomen is nondistended, soft and nontender. No organomegaly or masses felt. Normal bowel sounds heard. Central nervous system: he was able to tell me his name. And denies pain. He is very contracted.  Extremities; no edema     Data Reviewed: I have personally reviewed following labs and imaging studies  CBC:  Recent Labs Lab 11/01/16 1521 11/02/16 0617 11/03/16 0738  WBC 12.6* 9.2 7.1  NEUTROABS 10.7*  --   --    HGB 15.9 15.4 13.1  HCT 48.5 46.5 41.3  MCV 89.5 89.4 90.4  PLT 437* 338 [id number]   Basic Metabolic Panel:  Recent Labs Lab 11/01/16 1521 11/02/16 0617 11/03/16 0738  NA 155* 154* 146*  K 3.8 3.4* 3.0*  CL 120* 119* 114*  CO2 27 27 25   GLUCOSE 122* 124* 99  BUN 40* 32* 19  CREATININE 1.28* 1.25* 1.18  CALCIUM 9.7 9.1 8.7*  MG  --   --  2.0   GFR: CrCl cannot be calculated (Unknown ideal weight.). Liver Function Tests:  Recent Labs Lab 11/02/16 0617  AST 48*  ALT 21  ALKPHOS 70  BILITOT 1.7*  PROT 6.3*  ALBUMIN 2.8*   No results for input(s): LIPASE, AMYLASE in the last 168 hours.  Recent Labs Lab 11/03/16 0738  AMMONIA 49*   Coagulation Profile: No results for input(s): INR, PROTIME in the last 168 hours. Cardiac Enzymes:  Recent Labs Lab 11/01/16 2046 11/02/16 0024 11/02/16 0617  TROPONINI 0.07* 0.06* 0.06*   BNP (last 3 results) No results for input(s): PROBNP in the last 8760 hours. HbA1C: No results for input(s): HGBA1C in the last 72 hours. CBG:  Recent Labs Lab 11/03/16 0725  GLUCAP 88   Lipid Profile: No results for input(s): CHOL, HDL, LDLCALC, TRIG, CHOLHDL, LDLDIRECT in the last 72 hours. Thyroid Function Tests:  Recent Labs  11/01/16 2046  TSH 1.980   Anemia Panel:  Recent Labs  11/03/16 0738  VITAMINB12 2,655*   Sepsis Labs: No results for input(s): PROCALCITON, LATICACIDVEN in the last 168 hours.  Recent Results (from the past 240 hour(s))  Blood culture (routine x 2)     Status: None (Preliminary result)   Collection Time: 11/01/16  4:33 PM  Result Value Ref Range Status   Specimen Description BLOOD LEFT HAND  Final   Special Requests IN PEDIATRIC BOTTLE 1CC  Final   Culture   Final    NO GROWTH 2 DAYS Performed at [facility], [address], [place], [place] [address]    Report Status PENDING  Incomplete  Blood culture (routine x 2)     Status: None (Preliminary result)   Collection Time: 11/01/16   4:33 PM  Result Value Ref Range Status   Specimen Description BLOOD BLOOD RIGHT FOREARM  Final   Special Requests IN PEDIATRIC BOTTLE 3.5CC  Final   Culture   Final    NO GROWTH 2 DAYS Performed at [facility], [place] [address] [place], [place] [address]    Report Status PENDING  Incomplete  Urine culture     Status: None   Collection Time: 11/01/16  6:21 PM  Result Value Ref Range Status   Specimen Description URINE, RANDOM  Final   Special Requests NONE  Final   Culture   Final  NO GROWTH Performed at [facility], [place] [address], [place], [place] [address]    Report Status 11/03/2016 FINAL  Final  Culture, blood (routine x 2) Call MD if unable to obtain prior to antibiotics being given     Status: None (Preliminary result)   Collection Time: 11/01/16  8:46 PM  Result Value Ref Range Status   Specimen Description BLOOD RIGHT HAND  Final   Special Requests IN PEDIATRIC BOTTLE 2CC  Final   Culture   Final    NO GROWTH 2 DAYS Performed at [facility], [place] [address] [place], [place] [address]    Report Status PENDING  Incomplete  Culture, blood (routine x 2) Call MD if unable to obtain prior to antibiotics being given     Status: None (Preliminary result)   Collection Time: 11/01/16  8:46 PM  Result Value Ref Range Status   Specimen Description BLOOD RIGHT HAND  Final   Special Requests IN PEDIATRIC BOTTLE [place]  Final   Culture   Final    NO GROWTH 2 DAYS Performed at [facility], [place] [address] [place], [place] [address]    Report Status PENDING  Incomplete  MRSA PCR Screening     Status: None   Collection Time: 11/02/16 10:44 AM  Result Value Ref Range Status   MRSA by PCR NEGATIVE NEGATIVE Final    Comment:        The GeneXpert MRSA Assay (FDA approved for NASAL specimens only), is one component of a comprehensive MRSA colonization surveillance program. It is not intended to diagnose MRSA infection nor to guide or monitor treatment  for MRSA infections.          Radiology Studies: No results found.      Scheduled Meds: . acetaminophen  650 mg Oral BID  . calcium-vitamin D  2 tablet Oral BID  . chlorhexidine  15 mL Mouth/Throat BID  . citalopram  20 mg Oral QHS  . heparin  5,000 Units Subcutaneous Q8H  . pantoprazole  40 mg Oral Daily  . piperacillin-tazobactam (ZOSYN)  IV  3.375 g Intravenous Q8H  . sodium chloride flush  3 mL Intravenous Q12H  . sodium chloride flush  3 mL Intravenous Q12H  . vancomycin  750 mg Intravenous Q24H  . cyanocobalamin  1,000 mcg Oral q morning - 10a   Continuous Infusions: . dextrose 100 mL/hr at 11/03/16 1059     LOS: 2 days    Time spent: 35 minutes.     Elmarie Shiley, MD [facility] Pager [phone]  If 7PM-7AM, please contact night-coverage [email or web address] Password [email or web address] 11/03/2016, 3:30 PM

## 2016-11-04 DIAGNOSIS — J189 Pneumonia, unspecified organism: Secondary | ICD-10-CM

## 2016-11-04 LAB — BASIC METABOLIC PANEL
Anion gap: 6 (ref 5–15)
BUN: 15 mg/dL (ref 6–20)
CALCIUM: 8.3 mg/dL — AB (ref 8.9–10.3)
CHLORIDE: 114 mmol/L — AB (ref 101–111)
CO2: 20 mmol/L — ABNORMAL LOW (ref 22–32)
CREATININE: 0.82 mg/dL (ref 0.61–1.24)
GFR calc Af Amer: >60 mL/min (ref >60)
GFR calc non Af Amer: >60 mL/min (ref >60)
Glucose, Bld: 93 mg/dL (ref 65–99)
Potassium: 3.8 mmol/L (ref 3.5–5.1)
SODIUM: 140 mmol/L (ref 135–145)

## 2016-11-04 LAB — CBC
HCT: 39.2 % (ref 39.0–52.0)
HEMOGLOBIN: 12.7 g/dL — AB (ref 13.0–17.0)
MCH: 28.3 pg (ref 26.0–34.0)
MCHC: 32.4 g/dL (ref 30.0–36.0)
MCV: 87.3 fL (ref 78.0–100.0)
Platelets: 294 10*3/uL (ref 150–400)
RBC: 4.49 MIL/uL (ref 4.22–5.81)
RDW: 14.3 % (ref 11.5–15.5)
WBC: 9.1 10*3/uL (ref 4.0–10.5)

## 2016-11-04 LAB — GLUCOSE, CAPILLARY: Glucose-Capillary: 93 mg/dL (ref 65–99)

## 2016-11-04 LAB — AMMONIA: AMMONIA: 31 umol/L (ref 9–35)

## 2016-11-04 MED ORDER — LACTULOSE 10 GM/15ML PO SOLN
10.0000 g | Freq: Two times a day (BID) | ORAL | Status: DC
  Administered 2016-11-04 – 2016-11-05 (×3): 10 g via ORAL
  Filled 2016-11-04 (×3): qty 15

## 2016-11-04 NOTE — Progress Notes (Signed)
PROGRESS NOTE        PATIENT DETAILS Name: [name] Age: 83 y.o. Sex: male Date of Birth: 1926-07-22 Admit Date: 11/01/2016 Admitting Physician Benito Mccreedy, MD GQQ:[id number],IZT THOMAS, MD  Brief Narrative: Patient is a 83 y.o. male with history of dementia who was sent to ED from [place] facility due to a fall. He had recently completed antibiotics for pneumonia and the staff notes that since he had pneumonia, his mental status has progressively worsened. He has also had decreased oral intake. In the ED, he was hemodynamically stable but had very dry mucous membranes. Labs indicated elevated BUN and creatinine with hypernatremia and mild leukocytosis of 12,600. CXR in ED showed right lower lobe infiltrate. He also had an abrasion over his right eye, likely from fall. He was started on IV fluids and IV antibiotics.   Subjective: He is disoriented and does not communicate very well. He is sitting up in chair.   Assessment/Plan: Healthcare-associated pneumonia: CXR showed right lower lobe infiltrate. He recently completed antibiotic course for pneumonia. Continue IV zosyn. WBCs trending down. Blood culture no growth to date.   Hypernatremia due to dehydration: Sodium levels trending down with administration of IV fluids. Continue 5% dextrose. He continues to have poor oral intake.   Hypokalemia: Improved from 3.0 to 3.8.   Acute encephalopathy: Mental status has progressively worsened over past 2 weeks. Ammonia was mildly elevated at 49, lactulose was given, ammonia now improved at 31. Patient continues to be confused and disoriented. Could be related to infection or progression of dementia.   Dementia: Mental status has progressively worsened over past 2 weeks. Discussion with POA (patient's sister) - she is okay with SNF on discharge.   Bradycardia: Appears to be asymptomatic. He denies chest pain or dyspnea.   Acute kidney injury: Likely  due to dehydration. Creatinine on admission 1.28, has improved to 0.82 today.   DVT Prophylaxis: Prophylactic Heparin  Code Status: DNR  Family Communication: None at bedside Spoke with patient's sister over phone  Disposition Plan: Remain inpatient-but will plan on SNF on discharge.  Antimicrobial agents:  Vancomycin stopped 3-06 Zosyn 3/4 >>  Procedures: None  CONSULTS:  None   MEDICATIONS: Scheduled Meds: . acetaminophen  650 mg Oral BID  . calcium-vitamin D  2 tablet Oral BID  . chlorhexidine  15 mL Mouth/Throat BID  . citalopram  20 mg Oral QHS  . heparin  5,000 Units Subcutaneous Q8H  . lactulose  10 g Oral BID  . pantoprazole  40 mg Oral Daily  . piperacillin-tazobactam (ZOSYN)  IV  3.375 g Intravenous Q8H  . sodium chloride flush  3 mL Intravenous Q12H  . sodium chloride flush  3 mL Intravenous Q12H   Continuous Infusions: . dextrose 100 mL/hr at 11/03/16 1059   PRN Meds:.sodium chloride, ALPRAZolam, HYDROcodone-acetaminophen, ondansetron, sodium chloride flush   PHYSICAL EXAM: Vital signs: Vitals:   11/03/16 0439 11/03/16 1412 11/03/16 2200 11/04/16 0422  BP: (!) 120/59 130/77 (!) 122/51 134/68  Pulse: (!) 49 (!) 54 (!) 51 (!) 54  Resp: 18 18 18 20   Temp: 97.4 F (36.3 C) 98.4 F (36.9 C) 97.3 F (36.3 C) 98.3 F (36.8 C)  TempSrc: Oral Oral Oral Axillary  SpO2: 99%  100%    There were no vitals filed for this visit. There is no height or weight  on file to calculate BMI.   General appearance :Awake, alert, not in any distress. Speech limited due to mental status. Not toxic Looking Eyes:, pupils equally reactive to light and accomodation,no scleral icterus.Pink conjunctiva HEENT: Atraumatic and Normocephalic. Abrasion over right eye.  Neck: supple, no JVD. No cervical lymphadenopathy. No thyromegaly Resp:Good air entry bilaterally, no added sounds  CVS: S1 S2 regular, no murmurs.  GI: Bowel sounds present, Non tender and not distended with  no gaurding, rigidity or rebound.No organomegaly Extremities: B/L Lower Ext shows no edema, both legs are warm to touch Neurology:  speech clear,Non focal, sensation is grossly intact. Psychiatric: Alert and disoriented. Limited communication due to mental status.  Musculoskeletal:No digital cyanosis Skin:No Rash, warm and dry. Abrasion over right eye.  Wounds: Abrasion over right eye.   I have personally reviewed following labs and imaging studies  LABORATORY DATA: CBC:  Recent Labs Lab 11/01/16 1521 11/02/16 0617 11/03/16 0738 11/04/16 0656  WBC 12.6* 9.2 7.1 9.1  NEUTROABS 10.7*  --   --   --   HGB 15.9 15.4 13.1 12.7*  HCT 48.5 46.5 41.3 39.2  MCV 89.5 89.4 90.4 87.3  PLT 437* 338 288 277    Basic Metabolic Panel:  Recent Labs Lab 11/01/16 1521 11/02/16 0617 11/03/16 0738 11/04/16 0656  NA 155* 154* 146* 140  K 3.8 3.4* 3.0* 3.8  CL 120* 119* 114* 114*  CO2 27 27 25  20*  GLUCOSE 122* 124* 99 93  BUN 40* 32* 19 15  CREATININE 1.28* 1.25* 1.18 0.82  CALCIUM 9.7 9.1 8.7* 8.3*  MG  --   --  2.0  --     GFR: CrCl cannot be calculated (Unknown ideal weight.).  Liver Function Tests:  Recent Labs Lab 11/02/16 0617  AST 48*  ALT 21  ALKPHOS 70  BILITOT 1.7*  PROT 6.3*  ALBUMIN 2.8*   No results for input(s): LIPASE, AMYLASE in the last 168 hours.  Recent Labs Lab 11/03/16 0738 11/04/16 0656  AMMONIA 49* 31    Coagulation Profile: No results for input(s): INR, PROTIME in the last 168 hours.  Cardiac Enzymes:  Recent Labs Lab 11/01/16 2046 11/02/16 0024 11/02/16 0617  TROPONINI 0.07* 0.06* 0.06*    BNP (last 3 results) No results for input(s): PROBNP in the last 8760 hours.  HbA1C: No results for input(s): HGBA1C in the last 72 hours.  CBG:  Recent Labs Lab 11/03/16 0725 11/04/16 0734  GLUCAP 88 93    Lipid Profile: No results for input(s): CHOL, HDL, LDLCALC, TRIG, CHOLHDL, LDLDIRECT in the last 72 hours.  Thyroid Function  Tests:  Recent Labs  11/01/16 2046  TSH 1.980    Anemia Panel:  Recent Labs  11/03/16 0738  VITAMINB12 2,655*    Urine analysis:    Component Value Date/Time   COLORURINE YELLOW 11/01/2016 1459   APPEARANCEUR CLOUDY (A) 11/01/2016 1459   LABSPEC 1.023 11/01/2016 1459   PHURINE 5.0 11/01/2016 1459   GLUCOSEU NEGATIVE 11/01/2016 1459   HGBUR MODERATE (A) 11/01/2016 1459   BILIRUBINUR NEGATIVE 11/01/2016 1459   KETONESUR NEGATIVE 11/01/2016 1459   PROTEINUR 30 (A) 11/01/2016 1459   UROBILINOGEN 1.0 10/20/2014 1332   NITRITE NEGATIVE 11/01/2016 1459   LEUKOCYTESUR MODERATE (A) 11/01/2016 1459    Sepsis Labs: Lactic Acid, Venous No results found for: LATICACIDVEN  MICROBIOLOGY: Recent Results (from the past 240 hour(s))  Blood culture (routine x 2)     Status: None (Preliminary result)   Collection Time: 11/01/16  4:33 PM  Result Value Ref Range Status   Specimen Description BLOOD LEFT HAND  Final   Special Requests IN PEDIATRIC BOTTLE 1CC  Final   Culture   Final    NO GROWTH 2 DAYS Performed at [facility], [address], [place], [place] [address]    Report Status PENDING  Incomplete  Blood culture (routine x 2)     Status: None (Preliminary result)   Collection Time: 11/01/16  4:33 PM  Result Value Ref Range Status   Specimen Description BLOOD BLOOD RIGHT FOREARM  Final   Special Requests IN PEDIATRIC BOTTLE 3.5CC  Final   Culture   Final    NO GROWTH 2 DAYS Performed at [facility], [place] [address], [place], [place] [address]    Report Status PENDING  Incomplete  Urine culture     Status: None   Collection Time: 11/01/16  6:21 PM  Result Value Ref Range Status   Specimen Description URINE, RANDOM  Final   Special Requests NONE  Final   Culture   Final    NO GROWTH Performed at [facility], [address], [place], [place] [address]    Report Status 11/03/2016 FINAL  Final  Culture, blood (routine x 2) Call MD if unable to  obtain prior to antibiotics being given     Status: None (Preliminary result)   Collection Time: 11/01/16  8:46 PM  Result Value Ref Range Status   Specimen Description BLOOD RIGHT HAND  Final   Special Requests IN PEDIATRIC BOTTLE 2CC  Final   Culture   Final    NO GROWTH 2 DAYS Performed at [facility], [place] [address] [place], [place] [address]    Report Status PENDING  Incomplete  Culture, blood (routine x 2) Call MD if unable to obtain prior to antibiotics being given     Status: None (Preliminary result)   Collection Time: 11/01/16  8:46 PM  Result Value Ref Range Status   Specimen Description BLOOD RIGHT HAND  Final   Special Requests IN PEDIATRIC BOTTLE Register  Final   Culture   Final    NO GROWTH 2 DAYS Performed at [facility], [place] [address] [place], [place] [address]    Report Status PENDING  Incomplete  MRSA PCR Screening     Status: None   Collection Time: 11/02/16 10:44 AM  Result Value Ref Range Status   MRSA by PCR NEGATIVE NEGATIVE Final    Comment:        The GeneXpert MRSA Assay (FDA approved for NASAL specimens only), is one component of a comprehensive MRSA colonization surveillance program. It is not intended to diagnose MRSA infection nor to guide or monitor treatment for MRSA infections.   Culture, sputum-assessment     Status: None   Collection Time: 11/03/16  5:30 PM  Result Value Ref Range Status   Specimen Description EXPECTORATED SPUTUM  Final   Special Requests NONE  Final   Sputum evaluation   Final    Sputum specimen not acceptable for testing.  Please recollect.     Report Status 11/03/2016 FINAL  Final    RADIOLOGY STUDIES/RESULTS: Dg Chest 2 View  Result Date: 10/21/2016 CLINICAL DATA:  Cough, history of prostate cancer EXAM: CHEST  2 VIEW COMPARISON:  10/18/2016 FINDINGS: Cardiomediastinal silhouette is stable. Nodule in left midlung is stable in size in appearance from prior exam measures about 7.6 mm. Mild perihilar  and infrahilar increased bronchial and interstitial markings bilaterally. Bronchitic changes or viral pneumonitis cannot be excluded. Clinical correlation is necessary. Less likely pulmonary edema. Bony thorax is unremarkable. IMPRESSION: Nodule in left midlung is stable in size in appearance from prior exam measures about 7.6 mm. Mild perihilar and infrahilar increased bronchial and interstitial markings bilaterally. Bronchitic changes or viral pneumonitis cannot be excluded. Clinical correlation is necessary. Less likely pulmonary edema. Electronically Signed   By: Lahoma Crocker M.D.   On: 10/21/2016 10:45   Dg Cervical Spine Complete  Result Date: 10/18/2016 CLINICAL DATA:  Found on floor, presumed fall. History of back and neck pain. Initial encounter. EXAM: CERVICAL SPINE - COMPLETE 4+ VIEW COMPARISON:  09/25/2016 FINDINGS: Limited visualization of the cervicothoracic junction in the lateral projection, but normally aligned based on the obliques. No visible fracture or prevertebral thickening. No traumatic malalignment. Diffuse advanced disc degeneration. There is C5-6 interbody fusion or ankylosis. Diffuse facet arthropathy with spurring greatest at C3-4 and C4-5. IMPRESSION: 1. No evidence of cervical spine injury. 2. Diffuse advanced spinal degeneration. Electronically Signed   By: Monte Fantasia M.D.   On: 10/18/2016 10:51   Dg Thoracic Spine 2 View  Result Date: 10/21/2016 CLINICAL DATA:  Thoracic spine pain since a fall this morning. Initial encounter. EXAM: THORACIC SPINE 2 VIEWS COMPARISON:  None. FINDINGS: There is no evidence of thoracic spine fracture. Alignment is normal. No other significant bone abnormalities are identified. IMPRESSION: No acute abnormality. Electronically Signed   By: Inge Rise M.D.   On: 10/21/2016 09:53   Dg Lumbar Spine Complete  Result Date: 10/21/2016 CLINICAL DATA:  Unwitnessed fall loss morning.  Back pain. EXAM: LUMBAR SPINE - COMPLETE 4+ VIEW COMPARISON:   10/18/2016 lumbar spine radiographs. FINDINGS: This report assumes 5 non rib-bearing lumbar vertebrae. Minimal levocurvature of the lumbar spine. Lumbar vertebral body heights are preserved, with no fracture. Mild multilevel degenerative disc disease throughout the lumbar spine, most prominent at L2-3, unchanged. Minimal 2 mm retrolisthesis at L2-3, stable. No new spondylolisthesis. Mild-to-moderate facet arthropathy bilaterally in the lower lumbar spine. No aggressive appearing focal osseous lesions. Abdominal aortic atherosclerosis. Brachytherapy seeds overlie the lower pelvis on the coned lateral view. Stable tiny metallic density fragments throughout the bilateral abdomen. IMPRESSION: 1. No lumbar spine fracture or acute malalignment. 2. Stable mild multilevel lumbar degenerative disc disease, mild-to-moderate lower lumbar facet arthropathy and minimal degenerative retrolisthesis at L2-3. 3. Aortic atherosclerosis. Electronically Signed   By: Ilona Sorrel M.D.   On: 10/21/2016 09:56   Dg Lumbar Spine Complete  Result Date: 10/18/2016 CLINICAL DATA:  83 year old male with history of trauma from a fall this morning complaining of low back pain. EXAM: LUMBAR SPINE - COMPLETE 4+ VIEW COMPARISON:  No priors. FINDINGS: Multiple views is of the lumbar spine demonstrate no acute displaced fracture or definite compression type fracture. Alignment is anatomic. No defects of the pars interarticularis are noted. Mild multilevel degenerative disc disease and facet arthropathy, most pronounced at L4-L5 and L5-S1. Extensive aortic atherosclerosis. IMPRESSION: 1. No acute radiographic abnormality of the lumbar spine. 2. Mild multilevel degenerative disc disease and lumbar spondylosis, as above. 3. Aortic atherosclerosis. Electronically Signed   By: Vinnie Langton M.D.   On: 10/18/2016 10:56   Ct Head Wo Contrast  Result Date: 10/18/2016 CLINICAL DATA:  83 year old male with history of trauma from a fall. Emesis.  EXAM: CT HEAD WITHOUT CONTRAST CT CERVICAL SPINE WITHOUT CONTRAST TECHNIQUE: Multidetector CT imaging of the head and cervical spine was  performed following the standard protocol without intravenous contrast. Multiplanar CT image reconstructions of the cervical spine were also generated. COMPARISON:  Multiple priors, most recently head CT 09/25/2016 and cervical spine 09/25/2016. FINDINGS: CT HEAD FINDINGS Brain: Mild cerebral atrophy. Patchy and confluent areas of decreased attenuation are noted throughout the deep and periventricular white matter of the cerebral hemispheres bilaterally, compatible with chronic microvascular ischemic disease. No evidence of acute infarction, hemorrhage, hydrocephalus, extra-axial collection or mass lesion/mass effect. Vascular: No hyperdense vessel or unexpected calcification. Skull: Normal. Negative for fracture or focal lesion. Sinuses/Orbits: No acute finding. Other: None. CT CERVICAL SPINE FINDINGS Alignment: Normal. Skull base and vertebrae: No acute fracture. No primary bone lesion or focal pathologic process. Soft tissues and spinal canal: No prevertebral fluid or swelling. No visible canal hematoma. Disc levels: Multilevel degenerative disc disease, most severe at C5-C6, C6-C7 and C7-T1. At C5-C6 there is near complete bony fusion. Multilevel facet arthropathy. Upper chest: Visualized portions of the upper thorax are unremarkable. Other: None. IMPRESSION: 1. No evidence of significant acute traumatic injury to the skull, brain or cervical spine. 2. Mild cerebral atrophy with extensive chronic microvascular ischemic changes in the cerebral white matter redemonstrated. 3. Multilevel degenerative disc disease and cervical spondylosis, as above. Electronically Signed   By: Vinnie Langton M.D.   On: 10/18/2016 17:51   Ct Cervical Spine Wo Contrast  Result Date: 10/18/2016 CLINICAL DATA:  83 year old male with history of trauma from a fall. Emesis. EXAM: CT HEAD WITHOUT  CONTRAST CT CERVICAL SPINE WITHOUT CONTRAST TECHNIQUE: Multidetector CT imaging of the head and cervical spine was performed following the standard protocol without intravenous contrast. Multiplanar CT image reconstructions of the cervical spine were also generated. COMPARISON:  Multiple priors, most recently head CT 09/25/2016 and cervical spine 09/25/2016. FINDINGS: CT HEAD FINDINGS Brain: Mild cerebral atrophy. Patchy and confluent areas of decreased attenuation are noted throughout the deep and periventricular white matter of the cerebral hemispheres bilaterally, compatible with chronic microvascular ischemic disease. No evidence of acute infarction, hemorrhage, hydrocephalus, extra-axial collection or mass lesion/mass effect. Vascular: No hyperdense vessel or unexpected calcification. Skull: Normal. Negative for fracture or focal lesion. Sinuses/Orbits: No acute finding. Other: None. CT CERVICAL SPINE FINDINGS Alignment: Normal. Skull base and vertebrae: No acute fracture. No primary bone lesion or focal pathologic process. Soft tissues and spinal canal: No prevertebral fluid or swelling. No visible canal hematoma. Disc levels: Multilevel degenerative disc disease, most severe at C5-C6, C6-C7 and C7-T1. At C5-C6 there is near complete bony fusion. Multilevel facet arthropathy. Upper chest: Visualized portions of the upper thorax are unremarkable. Other: None. IMPRESSION: 1. No evidence of significant acute traumatic injury to the skull, brain or cervical spine. 2. Mild cerebral atrophy with extensive chronic microvascular ischemic changes in the cerebral white matter redemonstrated. 3. Multilevel degenerative disc disease and cervical spondylosis, as above. Electronically Signed   By: Vinnie Langton M.D.   On: 10/18/2016 17:51   Dg Chest Port 1 View  Result Date: 11/01/2016 CLINICAL DATA:  AMS x 2 weeks; hx heart murmur per chart EXAM: PORTABLE CHEST 1 VIEW COMPARISON:  10/21/2016 FINDINGS: Heart size is  normal. Aorta is tortuous. There is patchy infiltrate in the medial right lung base, consistent with infectious process. No pulmonary edema. IMPRESSION: Right lower lobe infiltrate. Electronically Signed   By: Nolon Nations M.D.   On: 11/01/2016 15:23   Dg Chest Port 1 View  Result Date: 10/18/2016 CLINICAL DATA:  Pain vomiting and unwitnessed fall. EXAM: PORTABLE CHEST  1 VIEW COMPARISON:  06/18/2016. FINDINGS: 1611 hours. Rightward patient rotation displaces cardiomediastinal anatomy and in the medial right hemithorax. Prominence of the cardiomediastinal contour likely related to AP supine technique. The lungs are clear wiithout focal pneumonia, edema, pneumothorax or pleural effusion. 6 mm left mid lung nodule again noted. Interstitial markings are diffusely coarsened with chronic features. The visualized bony structures of the thorax are intact. IMPRESSION: 1. Stable 6 mm nodule left mid lung. 2. No other acute cardiopulmonary findings. Electronically Signed   By: Misty Stanley M.D.   On: 10/18/2016 16:51     LOS: 3 days   Kyra Leyland, PA-S  Attending MD note  Patient was seen, examined,treatment plan was discussed with the PA-S Kyra Leyland.  I have personally reviewed the clinical findings, lab, imaging studies and management of this patient in detail. I agree with the documentation, as recorded by the PA-S.   Patient is a 83 year old male with dementia, currently residing in assisted living, here with poor p.o. intake, hypernatremia, as well as healthcare associated pneumonia.  On Exam: Gen. exam: Awake, alert, not in any distress.  Confused Chest: Good air entry bilaterally, no rhonchi or rales CVS: S1-S2 regular, no murmurs Abdomen: Soft, nontender and nondistended Neurology: Non-focal Skin: No rash or lesions  Plan  Healthcare-associated pneumonia  -Patient failed a course of antibiotics as an outpatient.  He has been placed on broad-spectrum antibiotics with vancomycin and  Zosyn on 3/4, his vancomycin was discontinued on 3/6.  Continue Zosyn.  Hypernatremia -Likely in the setting of dehydration and poor p.o. intake.  He needs higher level of care and constant reminder for adequate intake.  Improved with fluids.  Hypokalemia -Replete as indicated   Acute encephalopathy /underlying dementia -Patient has been having long-standing dementia for the past 15 years.  He has no underlying liver disease.  Bradycardia  -Appears to be asymptomatic. He denies chest pain or dyspnea.   Acute kidney injury -Likely due to dehydration. Creatinine on admission 1.28, has improved to 0.82 today.   Rest as above   If 7PM-7AM, please contact night-coverage [email or web address] Password [email or web address] 11/04/2016, 11:54 AM

## 2016-11-04 NOTE — NC FL2 (Signed)
[place] LEVEL OF CARE SCREENING TOOL     IDENTIFICATION  Patient Name: [name] Birthdate: 1926/03/01 Sex: male Admission Date (Current Location): 11/01/2016  [facility] and [place] Number:  Herbalist and Address:  [facility],  [place] [address], [place]      Provider Number: [id number]  Attending Physician Name and Address:  Caren Griffins, MD  Relative Name and Phone Number:       Current Level of Care: Hospital Recommended Level of Care: [place] Prior Approval Number:    Date Approved/Denied:   PASRR Number:   [id number]   Discharge Plan: SNF    Current Diagnoses: Patient Active Problem List   Diagnosis Date Noted  . HCAP (healthcare-associated pneumonia) 11/01/2016  . Dehydration 11/01/2016  . Hypernatremia 11/01/2016  . Acute renal insufficiency 11/01/2016  . Altered mental status 11/01/2016  . Hematuria 11/07/2014  . Protein-calorie malnutrition, severe ([place]) 08/18/2014  . Diaphoresis 08/17/2014  . Near syncope 08/17/2014  . Sinus bradycardia 08/17/2014  . Dementia 08/17/2014  . Pulmonary nodule 08/17/2014    Orientation RESPIRATION BLADDER Height & Weight     Self  Normal Incontinent Weight:   Height:     BEHAVIORAL SYMPTOMS/MOOD NEUROLOGICAL BOWEL NUTRITION STATUS      Incontinent Diet  AMBULATORY STATUS COMMUNICATION OF NEEDS Skin     Verbally Normal                       Personal Care Assistance Level of Assistance  Bathing, Feeding, Dressing Bathing Assistance: Limited assistance Feeding assistance: Independent Dressing Assistance: Limited assistance     Functional Limitations Info  Sight, Hearing, Speech Sight Info: Adequate Hearing Info: Adequate Speech Info: Adequate    SPECIAL CARE FACTORS FREQUENCY  PT (By licensed PT), OT (By licensed OT)     PT Frequency: 5 OT Frequency: 5            Contractures Contractures Info: Present    Additional  Factors Info  Code Status, Allergies Code Status Info: DNR Allergies Info: No Known Allergies           Current Medications (11/04/2016):  This is the current hospital active medication list Current Facility-Administered Medications  Medication Dose Route Frequency Provider Last Rate Last Dose  . 0.9 %  sodium chloride infusion  250 mL Intravenous PRN Benito Mccreedy, MD      . acetaminophen (TYLENOL) tablet 650 mg  650 mg Oral BID Benito Mccreedy, MD   650 mg at 11/03/16 2254  . ALPRAZolam Duanne Moron) tablet 0.5 mg  0.5 mg Oral Q6H PRN Benito Mccreedy, MD      . calcium-vitamin D (OSCAL WITH D) 500-200 MG-UNIT per tablet 2 tablet  2 tablet Oral BID Benito Mccreedy, MD   2 tablet at 11/03/16 2253  . chlorhexidine (PERIDEX) 0.12 % solution 15 mL  15 mL Mouth/Throat BID Belkys A Regalado, MD   15 mL at 11/03/16 2255  . citalopram (CELEXA) tablet 20 mg  20 mg Oral QHS Benito Mccreedy, MD   20 mg at 11/03/16 2254  . dextrose 5 % solution   Intravenous Continuous Belkys A Regalado, MD 100 mL/hr at 11/03/16 1059    . heparin injection 5,000 Units  5,000 Units Subcutaneous Q8H Benito Mccreedy, MD   5,000 Units at 11/04/16 0513  . HYDROcodone-acetaminophen (NORCO/VICODIN) 5-325 MG per tablet 1-2 tablet  1-2 tablet Oral Q4H PRN Benito Mccreedy, MD      .  lactulose (CHRONULAC) 10 GM/15ML solution 30 g  30 g Oral BID Belkys A Regalado, MD   30 g at 11/03/16 1807  . ondansetron (ZOFRAN-ODT) disintegrating tablet 4 mg  4 mg Oral Q8H PRN Benito Mccreedy, MD      . pantoprazole (PROTONIX) EC tablet 40 mg  40 mg Oral Daily Benito Mccreedy, MD   40 mg at 11/03/16 1057  . piperacillin-tazobactam (ZOSYN) IVPB 3.375 g  3.375 g Intravenous Q8H Belkys A Regalado, MD   3.375 g at 11/04/16 0513  . sodium chloride flush (NS) 0.9 % injection 3 mL  3 mL Intravenous Q12H Benito Mccreedy, MD      . sodium chloride flush (NS) 0.9 % injection 3 mL  3 mL Intravenous Q12H Benito Mccreedy, MD      . sodium  chloride flush (NS) 0.9 % injection 3 mL  3 mL Intravenous PRN Benito Mccreedy, MD         Discharge Medications: Please see discharge summary for a list of discharge medications.  Relevant Imaging Results:  Relevant Lab Results:   Additional Information ssn#[id number]  Lia Hopping, LCSW

## 2016-11-04 NOTE — Progress Notes (Signed)
LCSWA provided SNF bed offers to patient sister. She plans to discuss with patient wife then follow up with CSW with a decision.

## 2016-11-04 NOTE — Progress Notes (Signed)
OT Cancellation Note  Patient Details Name: [name] MRN: [id number] DOB: April 10, 1926   Cancelled Treatment:    Reason Eval/Treat Not Completed: Other (comment)  Noted pt from a SNF and returning to SNF- will defer OT eval to SNF.  Kari Baars, [place]  Payton Mccallum D 11/04/2016, 1:18 PM

## 2016-11-04 NOTE — Plan of Care (Signed)
Problem: Pain Managment: Goal: General experience of comfort will improve Outcome: Progressing Patient resting, took meds without difficulty,  Condom cath in place, IVF and IV ABX infusing, multiple bowel movements during the shift, no acute events will continue to monitor.

## 2016-11-04 NOTE — Evaluation (Signed)
Physical Therapy Evaluation Patient Details Name: [name] MRN: [id number] DOB: Jan 11, 1926 Today's Date: 11/04/2016   History of Present Illness  83 y.o. male with medical history significant for but not limited to dementia who was sent to [name] ED from [place] facility on account of a fall. Dx of HCAP, acute renal insufficiency, AMS.   Clinical Impression  Pt admitted with above diagnosis. Pt currently with functional limitations due to the deficits listed below (see PT Problem List). Mod assist for bed to recliner transfer with RW, B knees buckled during transfer. Prior functional level unclear as pt is confused and not able to provide this info. Based upon his strength/ROM its likely he was ambulatory prior to admission.  Pt will benefit from skilled PT to increase their independence and safety with mobility to allow discharge to the venue listed below.       Follow Up Recommendations SNF;Supervision/Assistance - 24 hour    Equipment Recommendations  Rolling walker with 5" wheels    Recommendations for Other Services       Precautions / Restrictions Precautions Precautions: Fall Precaution Comments: admitted with fall, pt confused so not able to provide fall hx Restrictions Weight Bearing Restrictions: No      Mobility  Bed Mobility Overal bed mobility: Needs Assistance Bed Mobility: Rolling;Sidelying to Sit Rolling: Mod assist Sidelying to sit: Mod assist       General bed mobility comments: mod A to raise trunk  Transfers Overall transfer level: Needs assistance Equipment used: Rolling walker (2 wheeled) Transfers: Sit to/from [facility] Sit to Stand: Mod assist;+2 safety/equipment Stand pivot transfers: Mod assist;+2 safety/equipment       General transfer comment: mod A to rise and to pivot with RW bed to recliner, pt able to stand with RW for 90 sec for pericare, knees buckled moderately during pivot  Ambulation/Gait                 Stairs            Wheelchair Mobility    Modified Rankin (Stroke Patients Only)       Balance Overall balance assessment: Needs assistance Sitting-balance support: Feet supported;Bilateral upper extremity supported Sitting balance-Leahy Scale: Poor Sitting balance - Comments: pt able to maintain neutral with feet/UEs supported for short periods then tended to lean R requiring min A Postural control: Right lateral lean Standing balance support: Bilateral upper extremity supported Standing balance-Leahy Scale: Poor Standing balance comment: relies on BUE support                             Pertinent Vitals/Pain Pain Assessment: Faces Faces Pain Scale: Hurts little more Pain Location: b knees Pain Descriptors / Indicators: Other (Comment) (pt rubbed his knees after transfer, stated both knees hurt) Pain Intervention(s): Limited activity within patient's tolerance;Monitored during session;Repositioned    Home Living Family/patient expects to be discharged to:: Skilled nursing facility                 Additional Comments: per chart, pt from SNF, pt confused so not able to provide this info    Prior Function           Comments: pt stated he doesn't use a cane or walker (no family present to confirm), PT note from 2015 stated pt was independent with mobility     Hand Dominance        Extremity/Trunk Assessment  Upper Extremity Assessment Upper Extremity Assessment: Difficult to assess due to impaired cognition    Lower Extremity Assessment Lower Extremity Assessment: Difficult to assess due to impaired cognition (able to actively extend B knees against gravity to -25*, knee ext to 0* in bed, PROM hips/knees/ankles WFL)    Cervical / Trunk Assessment Cervical / Trunk Assessment: Kyphotic  Communication   Communication: HOH  Cognition Arousal/Alertness: Awake/alert Behavior During Therapy: WFL for tasks  assessed/performed Overall Cognitive Status: No family/caregiver present to determine baseline cognitive functioning                 General Comments: h/o dementia, chart reports AMS, no family present to provide info on pt's baseline, pt oriented to self, not to location nor situation, can follow 1 step commands    General Comments      Exercises     Assessment/Plan    PT Assessment Patient needs continued PT services  PT Problem List Decreased activity tolerance;Decreased strength;Decreased balance;Decreased mobility;Decreased cognition;Pain;Decreased safety awareness       PT Treatment Interventions Gait training;DME instruction;Functional mobility training;Therapeutic exercise;Balance training;Therapeutic activities;Patient/family education    PT Goals (Current goals can be found in the Care Plan section)  Acute Rehab PT Goals PT Goal Formulation: Patient unable to participate in goal setting Time For Goal Achievement: 11/18/16 Potential to Achieve Goals: Good    Frequency Min 3X/week   Barriers to discharge        Co-evaluation               End of Session Equipment Utilized During Treatment: Gait belt Activity Tolerance: Patient tolerated treatment well Patient left: in chair;with call bell/phone within reach;with chair alarm set Nurse Communication: Mobility status PT Visit Diagnosis: Unsteadiness on feet (R26.81);History of falling (Z91.81);Difficulty in walking, not elsewhere classified (R26.2)         Time: 3552-1747 PT Time Calculation (min) (ACUTE ONLY): 26 min   Charges:   PT Evaluation $PT Eval Moderate Complexity: 1 Procedure PT Treatments $Therapeutic Activity: 8-22 mins   PT G Codes:         Philomena Doheny 11/04/2016, 9:53 AM [phone]

## 2016-11-04 NOTE — Progress Notes (Signed)
CM received call from Lyman of [facility] who states pt is active with [facility] for HHPT/OT and is requesting IF decision is to discharge back to ALF, MD please write orders for HHPT/OT/RN/SLP and face to face.  CM will follow for progress.

## 2016-11-04 NOTE — Progress Notes (Signed)
Palliative Care  [name] is unable to participate in a goals of care conversation secondary to dementia and acute encephalopathy. I called his HCPOA, Army Chaco, and left my name and direct contact information. I am presently waiting to hear back from her.   Charlynn Court [facility] Palliative Care [phone] (cell 7a-4p) [phone] (team phone for after hours and weekends)  No charge note

## 2016-11-04 NOTE — Consult Note (Signed)
Consultation Note Date: 11/04/2016   Patient Name: [name]  DOB: September 21, 1925  MRN: [id number]  Age / Sex: 83 y.o., male  PCP: Lajean Manes, MD Referring Physician: Caren Griffins, MD  Reason for Consultation: Establishing goals of care  HPI/Patient Profile: 83 y.o. male  with past medical history of Alzheimer's Dementia, prostate cancer, TIA, and GERD who presented from [facility] (memory care unit) after a fall. He was subsequenlty admitted on 11/01/2016 with HCAP and dehydration with electrolyte abnormalities. He has improved with antibiotics and IV hydration. Swallow evaluation pending. PT advising SNF level care after discharge. Palliative consulted to assist in goals of care determination. [name] has been presenting to the ED with increasing frequency, and had a more pronounced decline over the two weeks prior to admission. At present, he has poor oral intake, a recurrent pneumonia, and remains at high risk for recurrent hospitalizations due to falls and dehydration.   Clinical Assessment and Goals of Care: [name] is unable to participate in a goals of care conversation due to Alzheimer's Dementia. His HCPOA is his sister in-law, Army Chaco. I was able to reach her over the phone.  [name] was able to relate a long history of progressive decline. She explained his physical and mental deterioration, noting a marked difference over the past few weeks. She is clear that these changes are secondary to his Alzheimer's, understands he will not improve, and is clear that he will continue to decline. Her goal for him is to focus on comfort. In discussing this goal we talked about the two paths moving forward. The first would be to continue the current approach, which would mean recurrent hospitalizations for falls and dehydration. The second would be to transition to Hospice at a facility, which would entail keeping him comfortable in place and  not pursuing life prolonging measures such as IV hydration. [name] was very thoughtful about these options, and had extensive experience and knowledge about Hospice support.   At present, the plan is to transition him from the hospital to SNF for rehab. This period will ideally help optimize his functional status and help prevent falls. After rehab, [name] plans to likely transition him to Hospice (either at [facility] or back at [facility], depending on insurance allowance). We also discussed the option in the future for residential hospice in the event that he acutely declines with minimal oral intake.   Primary Decision Maker HCPOA   SUMMARY OF RECOMMENDATIONS    DNR, plan for discharge to SNF/rehab with Palliative Care  Likely transition to Hospice post rehab, [name] will facilitate  Consider residential hospice placement if acute deterioration  No aggressive or invasive interventions  Code Status/Advance Care Planning:  DNR  Palliative Prophylaxis:   Bowel Regimen and Frequent Pain Assessment  Additional Recommendations (Limitations, Scope, Preferences):  Focus on comfort, continue supportive care at present  Psycho-social/Spiritual:   Desire for further Chaplaincy support:no  Additional Recommendations: Education on Hospice  Prognosis:   < 6 months  Discharge Planning: [place] for rehab with Palliative care service follow-up      Primary Diagnoses: Present on Admission: **None**   I have reviewed the medical record, interviewed the patient and family, and examined the patient. The following aspects are pertinent.  Past Medical History:  Diagnosis Date  . Cancer [facility])    Prostate  . Dementia    Alzheimers  . GERD (gastroesophageal reflux disease)   . Heart murmur    systolic murmur no  further eval d/t Alzheimers dx  . Hematuria   . Stroke [facility])    TIA   Social History   Social History  . Marital status: Married    Spouse name: [name]  .  Number of children: [name]  . Years of education: [name]   Social History Main Topics  . Smoking status: Former Smoker    Packs/day: 1.00    Years: 10.00    Types: Cigarettes    Quit date: 08/31/1968  . Smokeless tobacco: Never Used  . Alcohol use No  . Drug use: No  . Sexual activity: No   Other Topics Concern  . None   Social History Narrative  . None   Family History  Problem Relation Age of Onset  . Heart disease Mother    Scheduled Meds: . acetaminophen  650 mg Oral BID  . calcium-vitamin D  2 tablet Oral BID  . chlorhexidine  15 mL Mouth/Throat BID  . citalopram  20 mg Oral QHS  . heparin  5,000 Units Subcutaneous Q8H  . lactulose  30 g Oral BID  . pantoprazole  40 mg Oral Daily  . piperacillin-tazobactam (ZOSYN)  IV  3.375 g Intravenous Q8H  . sodium chloride flush  3 mL Intravenous Q12H  . sodium chloride flush  3 mL Intravenous Q12H   Continuous Infusions: . dextrose 100 mL/hr at 11/03/16 1059   PRN Meds:.sodium chloride, ALPRAZolam, HYDROcodone-acetaminophen, ondansetron, sodium chloride flush No Known Allergies   Review of Systems: Unable to obtain d/t dementia  Physical Exam  Constitutional: He appears cachectic. He has a sickly appearance.  HENT:  Head: Normocephalic and atraumatic.  Mouth/Throat: No oropharyngeal exudate.  Eyes: EOM are normal.  Neck: Normal range of motion.  Cardiovascular: Bradycardia present.   Pulmonary/Chest: Effort normal and breath sounds normal.  Abdominal: Soft. Bowel sounds are normal. He exhibits no distension. There is no tenderness.  Musculoskeletal:  Unable to assess, pt does not follow commands. Does seem to be moving all extremities  Neurological: He is alert.  Socially responsive, but not engaging in conversation or following commands.   Skin: Skin is warm and dry. There is pallor.  Ruddy skin  Psychiatric: His speech is delayed. He is slowed and withdrawn. He expresses impulsivity and inappropriate judgment.     Vital Signs: BP 134/68 (BP Location: Left Arm)   Pulse (!) 54   Temp 98.3 F (36.8 C) (Axillary)   Resp 20   SpO2 100%  Pain Assessment: No/denies pain   Pain Score: Asleep   SpO2: SpO2: 100 % O2 Device:SpO2: 100 % O2 Flow Rate: .   IO: Intake/output summary:  Intake/Output Summary (Last 24 hours) at 11/04/16 0816 Last data filed at 11/04/16 0422  Gross per 24 hour  Intake             2090 ml  Output              600 ml  Net             1490 ml    LBM: Last BM Date:  (unknown); multiple BMs on 3/7 per nursing notes Baseline Weight:   Most recent weight:       Palliative Assessment/Data: PPS 50%    Time Total: 70 minutes Greater than 50%  of this time was spent counseling and coordinating care related to the above assessment and plan.  Signed by: Charlynn Court, NP Palliative Medicine Team Pager # [phone] (M-F 7a-5p) Team Phone # [phone] (Nights/Weekends)

## 2016-11-05 DIAGNOSIS — E860 Dehydration: Secondary | ICD-10-CM

## 2016-11-05 DIAGNOSIS — R4182 Altered mental status, unspecified: Secondary | ICD-10-CM | POA: Diagnosis not present

## 2016-11-05 DIAGNOSIS — F418 Other specified anxiety disorders: Secondary | ICD-10-CM | POA: Diagnosis not present

## 2016-11-05 DIAGNOSIS — K219 Gastro-esophageal reflux disease without esophagitis: Secondary | ICD-10-CM | POA: Diagnosis not present

## 2016-11-05 DIAGNOSIS — R5381 Other malaise: Secondary | ICD-10-CM | POA: Diagnosis not present

## 2016-11-05 DIAGNOSIS — R260 Ataxic gait: Secondary | ICD-10-CM | POA: Diagnosis not present

## 2016-11-05 DIAGNOSIS — Z7189 Other specified counseling: Secondary | ICD-10-CM | POA: Diagnosis not present

## 2016-11-05 DIAGNOSIS — R451 Restlessness and agitation: Secondary | ICD-10-CM | POA: Diagnosis not present

## 2016-11-05 DIAGNOSIS — E538 Deficiency of other specified B group vitamins: Secondary | ICD-10-CM | POA: Diagnosis not present

## 2016-11-05 DIAGNOSIS — F0390 Unspecified dementia without behavioral disturbance: Secondary | ICD-10-CM

## 2016-11-05 DIAGNOSIS — N179 Acute kidney failure, unspecified: Secondary | ICD-10-CM | POA: Diagnosis not present

## 2016-11-05 DIAGNOSIS — F0150 Vascular dementia without behavioral disturbance: Secondary | ICD-10-CM | POA: Diagnosis not present

## 2016-11-05 DIAGNOSIS — G8929 Other chronic pain: Secondary | ICD-10-CM | POA: Diagnosis not present

## 2016-11-05 DIAGNOSIS — J189 Pneumonia, unspecified organism: Secondary | ICD-10-CM | POA: Diagnosis not present

## 2016-11-05 DIAGNOSIS — F0280 Dementia in other diseases classified elsewhere without behavioral disturbance: Secondary | ICD-10-CM | POA: Diagnosis not present

## 2016-11-05 DIAGNOSIS — E870 Hyperosmolality and hypernatremia: Secondary | ICD-10-CM | POA: Diagnosis not present

## 2016-11-05 DIAGNOSIS — Z79899 Other long term (current) drug therapy: Secondary | ICD-10-CM | POA: Diagnosis not present

## 2016-11-05 DIAGNOSIS — R001 Bradycardia, unspecified: Secondary | ICD-10-CM | POA: Diagnosis not present

## 2016-11-05 DIAGNOSIS — E43 Unspecified severe protein-calorie malnutrition: Secondary | ICD-10-CM | POA: Diagnosis not present

## 2016-11-05 MED ORDER — HYDROCODONE-ACETAMINOPHEN 5-325 MG PO TABS: 1.0000 | ORAL_TABLET | ORAL | 0 refills | Status: DC | PRN

## 2016-11-05 MED ORDER — AMOXICILLIN-POT CLAVULANATE 875-125 MG PO TABS: 1.0000 | ORAL_TABLET | Freq: Two times a day (BID) | ORAL | Status: DC

## 2016-11-05 MED ORDER — ALPRAZOLAM 0.5 MG PO TABS: 0.5000 mg | ORAL_TABLET | Freq: Four times a day (QID) | ORAL | 0 refills | Status: DC | PRN

## 2016-11-05 NOTE — Discharge Summary (Signed)
Physician Discharge Summary  [name]:[id number] DOB: 06/15/1926 DOA: 11/01/2016  PCP: Mathews Argyle, MD  Admit date: 11/01/2016 Discharge date: 11/05/2016  Admitted From: ALF Disposition:  SNF  Recommendations for Outpatient Follow-up:  1. Continue Augmentin for 2 more days to complete a 7 day course for aspiration pneumonia 2. Recommend palliative care follow up at SNF   Discharge Condition: stable CODE STATUS: DNR Diet recommendation: as tolerated   HPI: Per Dr. Vista Lawman, [name] is a 83 y.o. male with medical history significant for but not limited to dementia who was sent to [name] ED from [facility] facility on account of a fall. He had recently completed antibiotic the facility for pneumonia. Staff indicates that since he had pneumonia he had not been himself and has been progressively worsening in terms of his mental status with decreased oral intake. No history of fever or chills or nausea or vomiting reported. Patient is able to give any history at this time on account of his mental status change.  Hospital Course: Discharge Diagnoses:  Principal Problem:   HCAP (healthcare-associated pneumonia) Active Problems:   Dehydration   Hypernatremia   Acute renal insufficiency   Altered mental status  Healthcare-associated pneumonia -Patient failed a course of antibiotics as an outpatient.  He has been placed on broad-spectrum antibiotics with vancomycin and Zosyn on 3/4, his vancomycin was discontinued on 3/6. He improved on Zosyn, will transition to Augmentin for 2 additional days. Aspiration precautions.  Hypernatremia -Likely in the setting of dehydration and poor p.o. intake.  Improved with fluids, now normal. Repeat BMP in 1 week Hypokalemia -Replete as indicated  Acute encephalopathy /underlying dementia -Patient has been having long-standing dementia for the past 15 years.  Bradycardia -Appears to be asymptomatic. He denies chest pain  or dyspnea.  Acute kidney injury -Likely due to dehydration. Creatinine on admission 1.28, has now normalized   Discharge Instructions   Allergies as of 11/05/2016   No Known Allergies     Medication List    STOP taking these medications   azithromycin 250 MG tablet Commonly known as:  ZITHROMAX Z-PAK   cephALEXin 500 MG capsule Commonly known as:  KEFLEX   ondansetron 4 MG tablet Commonly known as:  ZOFRAN     TAKE these medications   acetaminophen 325 MG tablet Commonly known as:  TYLENOL Take 650 mg by mouth 2 (two) times daily.   ALPRAZolam 0.5 MG tablet Commonly known as:  XANAX Take 1 tablet (0.5 mg total) by mouth every 6 (six) hours as needed (for aggitation).   amoxicillin-clavulanate 875-125 MG tablet Commonly known as:  AUGMENTIN Take 1 tablet by mouth 2 (two) times daily. For 2 more days   citalopram 20 MG tablet Commonly known as:  CELEXA Take 20 mg by mouth at bedtime.   Co Q 10 100 MG Caps Take 1 capsule by mouth daily with breakfast.   cyanocobalamin 1000 MCG tablet Take 1,000 mcg by mouth every morning.   HYDROcodone-acetaminophen 5-325 MG tablet Commonly known as:  NORCO/VICODIN Take 1-2 tablets by mouth every 4 (four) hours as needed for moderate pain.   memantine 10 MG tablet Commonly known as:  NAMENDA Take 10 mg by mouth 2 (two) times daily.   omeprazole 20 MG capsule Commonly known as:  PRILOSEC Take 20 mg by mouth daily before breakfast.   ondansetron 4 MG disintegrating tablet Commonly known as:  ZOFRAN ODT Take 1 tablet (4 mg total) by mouth every 8 (  eight) hours as needed for nausea or vomiting.   Red Yeast Rice 600 MG Caps Take 1,200 mg by mouth daily with breakfast.   VIACTIV 500-500-40 MG-UNT-MCG Chew Generic drug:  Calcium-Vitamin D-Vitamin K Chew 2 tablets by mouth 2 (two) times daily.      Contact information for after-discharge care    Destination    [facility] Follow up.     Specialty:  [place] information: [address] [place] [phone]             No Known Allergies  Consultations:  Palliative   Procedures/Studies:  Dg Chest 2 View  Result Date: 10/21/2016 CLINICAL DATA:  Cough, history of prostate cancer EXAM: CHEST  2 VIEW COMPARISON:  10/18/2016 FINDINGS: Cardiomediastinal silhouette is stable. Nodule in left midlung is stable in size in appearance from prior exam measures about 7.6 mm. Mild perihilar and infrahilar increased bronchial and interstitial markings bilaterally. Bronchitic changes or viral pneumonitis cannot be excluded. Clinical correlation is necessary. Less likely pulmonary edema. Bony thorax is unremarkable. IMPRESSION: Nodule in left midlung is stable in size in appearance from prior exam measures about 7.6 mm. Mild perihilar and infrahilar increased bronchial and interstitial markings bilaterally. Bronchitic changes or viral pneumonitis cannot be excluded. Clinical correlation is necessary. Less likely pulmonary edema. Electronically Signed   By: Lahoma Crocker M.D.   On: 10/21/2016 10:45   Dg Cervical Spine Complete  Result Date: 10/18/2016 CLINICAL DATA:  Found on floor, presumed fall. History of back and neck pain. Initial encounter. EXAM: CERVICAL SPINE - COMPLETE 4+ VIEW COMPARISON:  09/25/2016 FINDINGS: Limited visualization of the cervicothoracic junction in the lateral projection, but normally aligned based on the obliques. No visible fracture or prevertebral thickening. No traumatic malalignment. Diffuse advanced disc degeneration. There is C5-6 interbody fusion or ankylosis. Diffuse facet arthropathy with spurring greatest at C3-4 and C4-5. IMPRESSION: 1. No evidence of cervical spine injury. 2. Diffuse advanced spinal degeneration. Electronically Signed   By: Monte Fantasia M.D.   On: 10/18/2016 10:51   Dg Thoracic Spine 2 View  Result Date: 10/21/2016 CLINICAL DATA:   Thoracic spine pain since a fall this morning. Initial encounter. EXAM: THORACIC SPINE 2 VIEWS COMPARISON:  None. FINDINGS: There is no evidence of thoracic spine fracture. Alignment is normal. No other significant bone abnormalities are identified. IMPRESSION: No acute abnormality. Electronically Signed   By: Inge Rise M.D.   On: 10/21/2016 09:53   Dg Lumbar Spine Complete  Result Date: 10/21/2016 CLINICAL DATA:  Unwitnessed fall loss morning.  Back pain. EXAM: LUMBAR SPINE - COMPLETE 4+ VIEW COMPARISON:  10/18/2016 lumbar spine radiographs. FINDINGS: This report assumes 5 non rib-bearing lumbar vertebrae. Minimal levocurvature of the lumbar spine. Lumbar vertebral body heights are preserved, with no fracture. Mild multilevel degenerative disc disease throughout the lumbar spine, most prominent at L2-3, unchanged. Minimal 2 mm retrolisthesis at L2-3, stable. No new spondylolisthesis. Mild-to-moderate facet arthropathy bilaterally in the lower lumbar spine. No aggressive appearing focal osseous lesions. Abdominal aortic atherosclerosis. Brachytherapy seeds overlie the lower pelvis on the coned lateral view. Stable tiny metallic density fragments throughout the bilateral abdomen. IMPRESSION: 1. No lumbar spine fracture or acute malalignment. 2. Stable mild multilevel lumbar degenerative disc disease, mild-to-moderate lower lumbar facet arthropathy and minimal degenerative retrolisthesis at L2-3. 3. Aortic atherosclerosis. Electronically Signed   By: Ilona Sorrel M.D.   On: 10/21/2016 09:56   Dg Lumbar Spine Complete  Result  Date: 10/18/2016 CLINICAL DATA:  83 year old male with history of trauma from a fall this morning complaining of low back pain. EXAM: LUMBAR SPINE - COMPLETE 4+ VIEW COMPARISON:  No priors. FINDINGS: Multiple views is of the lumbar spine demonstrate no acute displaced fracture or definite compression type fracture. Alignment is anatomic. No defects of the pars interarticularis are  noted. Mild multilevel degenerative disc disease and facet arthropathy, most pronounced at L4-L5 and L5-S1. Extensive aortic atherosclerosis. IMPRESSION: 1. No acute radiographic abnormality of the lumbar spine. 2. Mild multilevel degenerative disc disease and lumbar spondylosis, as above. 3. Aortic atherosclerosis. Electronically Signed   By: Vinnie Langton M.D.   On: 10/18/2016 10:56   Ct Head Wo Contrast  Result Date: 10/18/2016 CLINICAL DATA:  83 year old male with history of trauma from a fall. Emesis. EXAM: CT HEAD WITHOUT CONTRAST CT CERVICAL SPINE WITHOUT CONTRAST TECHNIQUE: Multidetector CT imaging of the head and cervical spine was performed following the standard protocol without intravenous contrast. Multiplanar CT image reconstructions of the cervical spine were also generated. COMPARISON:  Multiple priors, most recently head CT 09/25/2016 and cervical spine 09/25/2016. FINDINGS: CT HEAD FINDINGS Brain: Mild cerebral atrophy. Patchy and confluent areas of decreased attenuation are noted throughout the deep and periventricular white matter of the cerebral hemispheres bilaterally, compatible with chronic microvascular ischemic disease. No evidence of acute infarction, hemorrhage, hydrocephalus, extra-axial collection or mass lesion/mass effect. Vascular: No hyperdense vessel or unexpected calcification. Skull: Normal. Negative for fracture or focal lesion. Sinuses/Orbits: No acute finding. Other: None. CT CERVICAL SPINE FINDINGS Alignment: Normal. Skull base and vertebrae: No acute fracture. No primary bone lesion or focal pathologic process. Soft tissues and spinal canal: No prevertebral fluid or swelling. No visible canal hematoma. Disc levels: Multilevel degenerative disc disease, most severe at C5-C6, C6-C7 and C7-T1. At C5-C6 there is near complete bony fusion. Multilevel facet arthropathy. Upper chest: Visualized portions of the upper thorax are unremarkable. Other: None. IMPRESSION: 1. No  evidence of significant acute traumatic injury to the skull, brain or cervical spine. 2. Mild cerebral atrophy with extensive chronic microvascular ischemic changes in the cerebral white matter redemonstrated. 3. Multilevel degenerative disc disease and cervical spondylosis, as above. Electronically Signed   By: Vinnie Langton M.D.   On: 10/18/2016 17:51   Ct Cervical Spine Wo Contrast  Result Date: 10/18/2016 CLINICAL DATA:  83 year old male with history of trauma from a fall. Emesis. EXAM: CT HEAD WITHOUT CONTRAST CT CERVICAL SPINE WITHOUT CONTRAST TECHNIQUE: Multidetector CT imaging of the head and cervical spine was performed following the standard protocol without intravenous contrast. Multiplanar CT image reconstructions of the cervical spine were also generated. COMPARISON:  Multiple priors, most recently head CT 09/25/2016 and cervical spine 09/25/2016. FINDINGS: CT HEAD FINDINGS Brain: Mild cerebral atrophy. Patchy and confluent areas of decreased attenuation are noted throughout the deep and periventricular white matter of the cerebral hemispheres bilaterally, compatible with chronic microvascular ischemic disease. No evidence of acute infarction, hemorrhage, hydrocephalus, extra-axial collection or mass lesion/mass effect. Vascular: No hyperdense vessel or unexpected calcification. Skull: Normal. Negative for fracture or focal lesion. Sinuses/Orbits: No acute finding. Other: None. CT CERVICAL SPINE FINDINGS Alignment: Normal. Skull base and vertebrae: No acute fracture. No primary bone lesion or focal pathologic process. Soft tissues and spinal canal: No prevertebral fluid or swelling. No visible canal hematoma. Disc levels: Multilevel degenerative disc disease, most severe at C5-C6, C6-C7 and C7-T1. At C5-C6 there is near complete bony fusion. Multilevel facet arthropathy. Upper chest: Visualized portions of  the upper thorax are unremarkable. Other: None. IMPRESSION: 1. No evidence of significant  acute traumatic injury to the skull, brain or cervical spine. 2. Mild cerebral atrophy with extensive chronic microvascular ischemic changes in the cerebral white matter redemonstrated. 3. Multilevel degenerative disc disease and cervical spondylosis, as above. Electronically Signed   By: Vinnie Langton M.D.   On: 10/18/2016 17:51   Dg Chest Port 1 View  Result Date: 11/01/2016 CLINICAL DATA:  AMS x 2 weeks; hx heart murmur per chart EXAM: PORTABLE CHEST 1 VIEW COMPARISON:  10/21/2016 FINDINGS: Heart size is normal. Aorta is tortuous. There is patchy infiltrate in the medial right lung base, consistent with infectious process. No pulmonary edema. IMPRESSION: Right lower lobe infiltrate. Electronically Signed   By: Nolon Nations M.D.   On: 11/01/2016 15:23   Dg Chest Port 1 View  Result Date: 10/18/2016 CLINICAL DATA:  Pain vomiting and unwitnessed fall. EXAM: PORTABLE CHEST 1 VIEW COMPARISON:  06/18/2016. FINDINGS: 1611 hours. Rightward patient rotation displaces cardiomediastinal anatomy and in the medial right hemithorax. Prominence of the cardiomediastinal contour likely related to AP supine technique. The lungs are clear wiithout focal pneumonia, edema, pneumothorax or pleural effusion. 6 mm left mid lung nodule again noted. Interstitial markings are diffusely coarsened with chronic features. The visualized bony structures of the thorax are intact. IMPRESSION: 1. Stable 6 mm nodule left mid lung. 2. No other acute cardiopulmonary findings. Electronically Signed   By: Misty Stanley M.D.   On: 10/18/2016 16:51      Subjective: - alert, minimally interactive  Discharge Exam: Vitals:   11/04/16 2036 11/05/16 0533  BP: 117/63 (!) 136/57  Pulse: (!) 55 (!) 53  Resp: 18 18  Temp: 97.9 F (36.6 C) 97.6 F (36.4 C)   Vitals:   11/04/16 0422 11/04/16 1339 11/04/16 2036 11/05/16 0533  BP: 134/68 120/68 117/63 (!) 136/57  Pulse: (!) 54 (!) 54 (!) 55 (!) 53  Resp: 20 18 18 18   Temp: 98.3 F  (36.8 C) 98.4 F (36.9 C) 97.9 F (36.6 C) 97.6 F (36.4 C)  TempSrc: Axillary Oral Oral Oral  SpO2:  99% 99% 96%    General: Pt is alert, awake, not in acute distress Cardiovascular: RRR, S1/S2 +, no rubs, no gallops Respiratory: CTA bilaterally, no wheezing, no rhonchi   The results of significant diagnostics from this hospitalization (including imaging, microbiology, ancillary and laboratory) are listed below for reference.     Microbiology: Recent Results (from the past 240 hour(s))  Blood culture (routine x 2)     Status: None (Preliminary result)   Collection Time: 11/01/16  4:33 PM  Result Value Ref Range Status   Specimen Description BLOOD LEFT HAND  Final   Special Requests IN PEDIATRIC BOTTLE 1CC  Final   Culture   Final    NO GROWTH 3 DAYS Performed at [facility], [address], [place], [place] [address]    Report Status PENDING  Incomplete  Blood culture (routine x 2)     Status: None (Preliminary result)   Collection Time: 11/01/16  4:33 PM  Result Value Ref Range Status   Specimen Description BLOOD BLOOD RIGHT FOREARM  Final   Special Requests IN PEDIATRIC BOTTLE 3.5CC  Final   Culture   Final    NO GROWTH 3 DAYS Performed at [facility], [place] [address] [place], [place] [address]    Report Status PENDING  Incomplete  Urine culture  Status: None   Collection Time: 11/01/16  6:21 PM  Result Value Ref Range Status   Specimen Description URINE, RANDOM  Final   Special Requests NONE  Final   Culture   Final    NO GROWTH Performed at [facility], [address], [place], [place] [address]    Report Status 11/03/2016 FINAL  Final  Culture, blood (routine x 2) Call MD if unable to obtain prior to antibiotics being given     Status: None (Preliminary result)   Collection Time: 11/01/16  8:46 PM  Result Value Ref Range Status   Specimen Description BLOOD RIGHT HAND  Final   Special Requests IN PEDIATRIC BOTTLE 2CC  Final   Culture    Final    NO GROWTH 3 DAYS Performed at [facility], [place] [address], [place], [place] [address]    Report Status PENDING  Incomplete  Culture, blood (routine x 2) Call MD if unable to obtain prior to antibiotics being given     Status: None (Preliminary result)   Collection Time: 11/01/16  8:46 PM  Result Value Ref Range Status   Specimen Description BLOOD RIGHT HAND  Final   Special Requests IN PEDIATRIC BOTTLE [place]  Final   Culture   Final    NO GROWTH 3 DAYS Performed at [facility], [place] [address] [place], [place] [address]    Report Status PENDING  Incomplete  MRSA PCR Screening     Status: None   Collection Time: 11/02/16 10:44 AM  Result Value Ref Range Status   MRSA by PCR NEGATIVE NEGATIVE Final    Comment:        The GeneXpert MRSA Assay (FDA approved for NASAL specimens only), is one component of a comprehensive MRSA colonization surveillance program. It is not intended to diagnose MRSA infection nor to guide or monitor treatment for MRSA infections.   Culture, sputum-assessment     Status: None   Collection Time: 11/03/16  5:30 PM  Result Value Ref Range Status   Specimen Description EXPECTORATED SPUTUM  Final   Special Requests NONE  Final   Sputum evaluation   Final    Sputum specimen not acceptable for testing.  Please recollect.     Report Status 11/03/2016 FINAL  Final     Labs: BNP (last 3 results)  Recent Labs  11/01/16 2046  BNP 91.4   Basic Metabolic Panel:  Recent Labs Lab 11/01/16 1521 11/02/16 0617 11/03/16 0738 11/04/16 0656  NA 155* 154* 146* 140  K 3.8 3.4* 3.0* 3.8  CL 120* 119* 114* 114*  CO2 27 27 25  20*  GLUCOSE 122* 124* 99 93  BUN 40* 32* 19 15  CREATININE 1.28* 1.25* 1.18 0.82  CALCIUM 9.7 9.1 8.7* 8.3*  MG  --   --  2.0  --    Liver Function Tests:  Recent Labs Lab 11/02/16 0617  AST 48*  ALT 21  ALKPHOS 70  BILITOT 1.7*  PROT 6.3*  ALBUMIN 2.8*   No results for input(s): LIPASE, AMYLASE in  the last 168 hours.  Recent Labs Lab 11/03/16 0738 11/04/16 0656  AMMONIA 49* 31   CBC:  Recent Labs Lab 11/01/16 1521 11/02/16 0617 11/03/16 0738 11/04/16 0656  WBC 12.6* 9.2 7.1 9.1  NEUTROABS 10.7*  --   --   --   HGB 15.9 15.4 13.1 12.7*  HCT 48.5 46.5 41.3 39.2  MCV 89.5 89.4 90.4 87.3  PLT 437*  338 288 294   Cardiac Enzymes:  Recent Labs Lab 11/01/16 2046 11/02/16 0024 11/02/16 0617  TROPONINI 0.07* 0.06* 0.06*   BNP: Invalid input(s): POCBNP CBG:  Recent Labs Lab 11/03/16 0725 11/04/16 0734  GLUCAP 88 93   D-Dimer No results for input(s): DDIMER in the last 72 hours. Hgb A1c No results for input(s): HGBA1C in the last 72 hours. Lipid Profile No results for input(s): CHOL, HDL, LDLCALC, TRIG, CHOLHDL, LDLDIRECT in the last 72 hours. Thyroid function studies No results for input(s): TSH, T4TOTAL, T3FREE, THYROIDAB in the last 72 hours.  Invalid input(s): FREET3 Anemia work up  Recent Labs  11/03/16 0738  VITAMINB12 2,655*   Urinalysis    Component Value Date/Time   COLORURINE YELLOW 11/01/2016 1459   APPEARANCEUR CLOUDY (A) 11/01/2016 1459   LABSPEC 1.023 11/01/2016 1459   PHURINE 5.0 11/01/2016 1459   GLUCOSEU NEGATIVE 11/01/2016 1459   HGBUR MODERATE (A) 11/01/2016 1459   BILIRUBINUR NEGATIVE 11/01/2016 1459   KETONESUR NEGATIVE 11/01/2016 1459   PROTEINUR 30 (A) 11/01/2016 1459   UROBILINOGEN 1.0 10/20/2014 1332   NITRITE NEGATIVE 11/01/2016 1459   LEUKOCYTESUR MODERATE (A) 11/01/2016 1459   Sepsis Labs Invalid input(s): PROCALCITONIN,  WBC,  LACTICIDVEN Microbiology Recent Results (from the past 240 hour(s))  Blood culture (routine x 2)     Status: None (Preliminary result)   Collection Time: 11/01/16  4:33 PM  Result Value Ref Range Status   Specimen Description BLOOD LEFT HAND  Final   Special Requests IN PEDIATRIC BOTTLE 1CC  Final   Culture   Final    NO GROWTH 3 DAYS Performed at [facility], [place] [address] [place], [place] [address]    Report Status PENDING  Incomplete  Blood culture (routine x 2)     Status: None (Preliminary result)   Collection Time: 11/01/16  4:33 PM  Result Value Ref Range Status   Specimen Description BLOOD BLOOD RIGHT FOREARM  Final   Special Requests IN PEDIATRIC BOTTLE 3.5CC  Final   Culture   Final    NO GROWTH 3 DAYS Performed at [facility], [place] [address] [place], [place] [address]    Report Status PENDING  Incomplete  Urine culture     Status: None   Collection Time: 11/01/16  6:21 PM  Result Value Ref Range Status   Specimen Description URINE, RANDOM  Final   Special Requests NONE  Final   Culture   Final    NO GROWTH Performed at [facility], [address] [place], [place] [address]    Report Status 11/03/2016 FINAL  Final  Culture, blood (routine x 2) Call MD if unable to obtain prior to antibiotics being given     Status: None (Preliminary result)   Collection Time: 11/01/16  8:46 PM  Result Value Ref Range Status   Specimen Description BLOOD RIGHT HAND  Final   Special Requests IN PEDIATRIC BOTTLE 2CC  Final   Culture   Final    NO GROWTH 3 DAYS Performed at [facility], [place] [address] [place], [place] [address]    Report Status PENDING  Incomplete  Culture, blood (routine x 2) Call MD if unable to obtain prior to antibiotics being given     Status: None (Preliminary result)   Collection Time: 11/01/16  8:46 PM  Result Value Ref Range Status   Specimen Description BLOOD RIGHT HAND  Final   Special Requests IN PEDIATRIC  BOTTLE 1CC  Final   Culture   Final    NO GROWTH 3 DAYS Performed at [facility], [place] [address], [place], [place] [address]    Report Status PENDING  Incomplete  MRSA PCR Screening     Status: None   Collection Time: 11/02/16 10:44 AM  Result Value Ref Range Status   MRSA by PCR NEGATIVE NEGATIVE Final    Comment:        The GeneXpert MRSA Assay (FDA approved for NASAL specimens only),  is one component of a comprehensive MRSA colonization surveillance program. It is not intended to diagnose MRSA infection nor to guide or monitor treatment for MRSA infections.   Culture, sputum-assessment     Status: None   Collection Time: 11/03/16  5:30 PM  Result Value Ref Range Status   Specimen Description EXPECTORATED SPUTUM  Final   Special Requests NONE  Final   Sputum evaluation   Final    Sputum specimen not acceptable for testing.  Please recollect.     Report Status 11/03/2016 FINAL  Final     Time coordinating discharge: 35 minutes  SIGNED:  Marzetta Board, MD  [facility] 11/05/2016, 1:10 PM Pager [phone]  If 7PM-7AM, please contact night-coverage [email or web address] Password [email or web address]

## 2016-11-05 NOTE — Progress Notes (Signed)
PHARMACY NOTE -  zosyn  Pharmacy has been assisting with dosing of zosyn for PNA.  Patient's on day 5 of 7.  Renal function has improved. Dosage remains stable at 3.375 gm IV q8h (infuse over 4 hours) and need for further dosage adjustment appears unlikely at present.    Will sign off at this time.  Please reconsult if a change in clinical status warrants re-evaluation of dosage.   Dia Sitter, PharmD, BCPS 11/05/2016 11:54 AM

## 2016-11-05 NOTE — Progress Notes (Signed)
Patient POA-Donna will fill out admission paperwork at 3:30, then patient will D/C to facility. RN notified.

## 2016-11-05 NOTE — Evaluation (Signed)
SLP Cancellation Note  Patient Details Name: AXELL TRIGUEROS MRN: [id number] DOB: 02-02-1926   Cancelled treatment:       Reason Eval/Treat Not Completed: Other (comment) (pt with RN John at this time who reports pt is hypotensive, note plans for pt to dc to facility and palliative meeting indicates focus on comfort, RN reports pt tolerating po without indications of aspiration, please reorder SLP if desire )   Macario Golds 11/05/2016, 2:46 PM

## 2016-11-05 NOTE — Progress Notes (Signed)
[facility] called for [facility] transport. RN notified.  Wait time before pick up 1 hour-30 min.

## 2016-11-06 ENCOUNTER — Non-Acute Institutional Stay (SKILLED_NURSING_FACILITY): Payer: Medicare Other | Admitting: Adult Health

## 2016-11-06 ENCOUNTER — Other Ambulatory Visit: Payer: Self-pay

## 2016-11-06 ENCOUNTER — Encounter: Payer: Self-pay | Admitting: Adult Health

## 2016-11-06 DIAGNOSIS — F418 Other specified anxiety disorders: Secondary | ICD-10-CM | POA: Insufficient documentation

## 2016-11-06 DIAGNOSIS — F0150 Vascular dementia without behavioral disturbance: Secondary | ICD-10-CM | POA: Diagnosis not present

## 2016-11-06 DIAGNOSIS — K219 Gastro-esophageal reflux disease without esophagitis: Secondary | ICD-10-CM | POA: Diagnosis not present

## 2016-11-06 DIAGNOSIS — G8929 Other chronic pain: Secondary | ICD-10-CM | POA: Diagnosis not present

## 2016-11-06 DIAGNOSIS — E538 Deficiency of other specified B group vitamins: Secondary | ICD-10-CM

## 2016-11-06 DIAGNOSIS — R001 Bradycardia, unspecified: Secondary | ICD-10-CM | POA: Diagnosis not present

## 2016-11-06 DIAGNOSIS — J189 Pneumonia, unspecified organism: Secondary | ICD-10-CM

## 2016-11-06 DIAGNOSIS — E43 Unspecified severe protein-calorie malnutrition: Secondary | ICD-10-CM

## 2016-11-06 LAB — CULTURE, BLOOD (ROUTINE X 2)
CULTURE: NO GROWTH
CULTURE: NO GROWTH
Culture: NO GROWTH
Culture: NO GROWTH

## 2016-11-06 MED ORDER — ALPRAZOLAM 0.5 MG PO TABS: 0.5000 mg | ORAL_TABLET | Freq: Four times a day (QID) | ORAL | 0 refills | Status: DC | PRN

## 2016-11-06 MED ORDER — HYDROCODONE-ACETAMINOPHEN 5-325 MG PO TABS: 1.0000 | ORAL_TABLET | ORAL | 0 refills | Status: DC | PRN

## 2016-11-06 NOTE — Telephone Encounter (Signed)
Prescription request was received from:  [facility]  [address] [place] [address]  PHONE: [phone]   Fax: [phone] 

## 2016-11-06 NOTE — Progress Notes (Signed)
Location:   [facility] Room Number: 129 A Place of Service:  SNF (31)   CODE STATUS: full code    No Known Allergies  Chief Complaint  Patient presents with  . Hospitalization Follow-up   HPI:  He has been hospitalized for pneumonia and dehydration. He is unable to participate in the hpi or ros. There are no nursing concerns at this time. He is here for short term rehab. At this time; more than likely this does represent a long term placement for him.    Past Medical History:  Diagnosis Date  . Cancer [facility])    Prostate  . Dementia    Alzheimers  . GERD (gastroesophageal reflux disease)   . Heart murmur    systolic murmur no further eval d/t Alzheimers dx  . Hematuria   . Stroke [facility])    TIA    Past Surgical History:  Procedure Laterality Date  . BALLOON DILATION [name] 07/03/2014   Procedure: BALLOON DILATION;  Surgeon: Garlan Fair, MD;  Location: [facility];  Service: Endoscopy;  Laterality: [name];  . BALLOON DILATION [name] 06/03/2015   Procedure: BALLOON DILATION;  Surgeon: Garlan Fair, MD;  Location: [facility] ENDOSCOPY;  Service: Endoscopy;  Laterality: [name];  . CYSTOSCOPY [name] 11/07/2014   Procedure: CYSTOSCOPY;  Surgeon: Alexis Frock, MD;  Location: [facility] ORS;  Service: Urology;  Laterality: [name];  . ESOPHAGOGASTRODUODENOSCOPY [name] 07/03/2014   Procedure: ESOPHAGOGASTRODUODENOSCOPY (EGD);  Surgeon: Garlan Fair, MD;  Location: [facility];  Service: Endoscopy;  Laterality: [name];  . ESOPHAGOGASTRODUODENOSCOPY (EGD) WITH PROPOFOL [name] 06/03/2015   Procedure: ESOPHAGOGASTRODUODENOSCOPY (EGD) WITH PROPOFOL;  Surgeon: Garlan Fair, MD;  Location: [facility] ENDOSCOPY;  Service: Endoscopy;  Laterality: [name];  . HERNIA REPAIR  1970   double  . seed implant for prostate cancer  2006  . TRANSURETHRAL RESECTION OF PROSTATE [name] 11/07/2014   Procedure: TRANSURETHRAL VAPORIZATION OF THE PROSTATE AND BLADDER LESIONS WITH ERBE (NEW GYRUS) REMOVAL OF BLADDER STONE; PROSTATIC  URETHRA BIOPSY;  Surgeon: Alexis Frock, MD;  Location: [facility] ORS;  Service: Urology;  Laterality: [name];    Social History   Social History  . Marital status: Married    Spouse name: [name]  . Number of children: [name]  . Years of education: [name]   Occupational History  . Not on file.   Social History Main Topics  . Smoking status: Former Smoker    Packs/day: 1.00    Years: 10.00    Types: Cigarettes    Quit date: 08/31/1968  . Smokeless tobacco: Never Used  . Alcohol use No  . Drug use: No  . Sexual activity: No   Other Topics Concern  . Not on file   Social History Narrative  . No narrative on file   Family History  Problem Relation Age of Onset  . Heart disease Mother       VITAL SIGNS BP (!) 148/66   Pulse 62   Temp 98.6 F (37 C)   Resp 20   Ht 5\' 10"  (1.778 m)   Wt 133 lb 9.6 oz (60.6 kg)   SpO2 94%   BMI 19.17 kg/m   Patient's Medications  New Prescriptions   No medications on file  Previous Medications   ACETAMINOPHEN (TYLENOL) 325 MG TABLET    Take 650 mg by mouth 2 (two) times daily.    ALPRAZOLAM (XANAX) 0.5 MG TABLET    Take 1 tablet (0.5 mg total) by mouth every 6 (six) hours as needed (  for aggitation).   AMOXICILLIN-CLAVULANATE (AUGMENTIN) 875-125 MG TABLET    Take 1 tablet by mouth 2 (two) times daily. For 2 more days   CALCIUM-VITAMIN D-VITAMIN K (VIACTIV) 086-578-46 MG-UNT-MCG CHEW    Chew 2 tablets by mouth 2 (two) times daily.   CITALOPRAM (CELEXA) 20 MG TABLET    Take 20 mg by mouth at bedtime.   COENZYME Q10 (CO Q 10) 100 MG CAPS    Take 1 capsule by mouth daily with breakfast.    CYANOCOBALAMIN 1000 MCG TABLET    Take 1,000 mcg by mouth every morning.    HYDROCODONE-ACETAMINOPHEN (NORCO/VICODIN) 5-325 MG TABLET    Take 1-2 tablets by mouth every 4 (four) hours as needed for moderate pain.   MEMANTINE (NAMENDA) 10 MG TABLET    Take 10 mg by mouth 2 (two) times daily.    OMEPRAZOLE (PRILOSEC) 20 MG CAPSULE    Take 20 mg by mouth daily before  breakfast.    ONDANSETRON 4 MG FILM    Take 4 mg by mouth every 8 (eight) hours as needed.   RED YEAST RICE 600 MG CAPS    Take 1,200 mg by mouth daily with breakfast.  Modified Medications   No medications on file  Discontinued Medications   ONDANSETRON (ZOFRAN ODT) 4 MG DISINTEGRATING TABLET    Take 1 tablet (4 mg total) by mouth every 8 (eight) hours as needed for nausea or vomiting.     SIGNIFICANT DIAGNOSTIC EXAMS  10-18-16: ct of head and cervical spine: 1. No evidence of significant acute traumatic injury to the skull, brain or cervical spine. 2. Mild cerebral atrophy with extensive chronic microvascular ischemic changes in the cerebral white matter redemonstrated. 3. Multilevel degenerative disc disease and cervical spondylosis, as above.  11-01-16: chest x-ray: Right lower lobe infiltrate.   LABS REVIEWED:   11-01-16: wbc 12.6; hgb 15.9; hct 48.5; mcv 89.5; plt 437; glucose 122; bun 40; creat 1.28; k+ 3.8; na++ 155; blood culture no growth; urine culture: no growth; HIV: nr; tsh 1.980 11-02-16: wbc 9.2; hgb 15.4; hct 46.5; mcv 89.4; plt 338; glucose 124; bun 32; creat 1.25; k+ 3.4; na++ 154; ast 48; total bili 1.7; albumin 2.8 11-03-16: wbc 7.1; hgb 13.1; hct 41.3; mcv 90.4; plt 288; glucose 99; bun 19; creat 1.18; k+ 3.0; na++ 146 mag 2.0; ammonia 49; vit B 12: 2265 11-04-16: wbc 9.1; hgb 12.7; hct 39.2; mcv 87.3; plt 294; glucose 93; bun 15; creat 0.82; k+ 3.8; na++ 140; ammonia 31    Review of Systems  Unable to perform ROS: Dementia     Physical Exam  Constitutional: No distress.  Eyes: Conjunctivae are normal.  Neck: Neck supple. No JVD present. No thyromegaly present.  Cardiovascular: Normal rate, regular rhythm and intact distal pulses.   Respiratory: Effort normal and breath sounds normal. No respiratory distress. He has no wheezes.  GI: Soft. Bowel sounds are normal. He exhibits no distension. There is no tenderness.  Musculoskeletal: He exhibits no edema.  Able to  move all extremities  Does have stiffness present in extremities   Lymphadenopathy:    He has no cervical adenopathy.  Neurological: He is alert.  Skin: Skin is warm and dry. He is not diaphoretic.  Psychiatric: He has a normal mood and affect.      ASSESSMENT/ PLAN:  1. Bradycardia: no symptoms present; will monitor   2. Depression with anxiety: is presently on celexa 20 mg daily has xanax 0.5 mg every  6 hours  as needed and will monitor   3. Gerd: has history of balloon dilatation will continue prilosec 20 mg daily  4.  Dementia: 133 pounds: will continue namenda 10 mg twice daily   5. Protein calorie malnutrition: weight is 133 pounds; albumin 2.8; will continue supplements per facility protocol   6. Chronic pain: he takes tylenol 650 mg twice daily there are no indications of pain present.  Will continue vicodin 5/325 mg twice daily   7. Vit B deficiency: vit B 12: 2655; will stop supplement at this time and will monitor  8. Pneumonia: will complete augmentin 875 mg twice daily for 2 days and will monitor   Will check cbc; cmp next week and will repeat chest x-ray in 4 weeks.   Time spent with patient  50  minutes >50% time spent counseling; reviewing medical record; tests; labs; and developing future plan of care    MD is aware of resident's narcotic use and is in agreement with current plan of care. We will attempt to wean resident as apropriate   Jacob Edwards NP [facility]  Contact [phone] Monday through Friday 8am- 5pm  After hours call [phone]

## 2016-11-09 ENCOUNTER — Non-Acute Institutional Stay (SKILLED_NURSING_FACILITY): Payer: Medicare Other | Admitting: Internal Medicine

## 2016-11-09 ENCOUNTER — Encounter: Payer: Self-pay | Admitting: Internal Medicine

## 2016-11-09 DIAGNOSIS — F418 Other specified anxiety disorders: Secondary | ICD-10-CM | POA: Diagnosis not present

## 2016-11-09 DIAGNOSIS — G8929 Other chronic pain: Secondary | ICD-10-CM | POA: Diagnosis not present

## 2016-11-09 DIAGNOSIS — F0150 Vascular dementia without behavioral disturbance: Secondary | ICD-10-CM | POA: Diagnosis not present

## 2016-11-09 DIAGNOSIS — J189 Pneumonia, unspecified organism: Secondary | ICD-10-CM

## 2016-11-09 DIAGNOSIS — R5381 Other malaise: Secondary | ICD-10-CM

## 2016-11-09 DIAGNOSIS — E43 Unspecified severe protein-calorie malnutrition: Secondary | ICD-10-CM

## 2016-11-09 DIAGNOSIS — K219 Gastro-esophageal reflux disease without esophagitis: Secondary | ICD-10-CM | POA: Diagnosis not present

## 2016-11-09 NOTE — Progress Notes (Signed)
Patient ID: [name], male   DOB: 03/24/1926, 83 y.o.   MRN: [id number]    HISTORY AND PHYSICAL   DATE: 11/09/16  Location:    [facility]   Place of Service:   SNF  Extended Emergency Contact Information Primary Emergency Contact: Franks,Betty Address: [address]           [place], [place] [address] [place] Phone: [phone] Relation: Spouse Secondary Emergency Contact: Burnetta Sabin [place] Phone: [phone] Relation: Sister  Advanced Directive information  DNR; MOST FORM ON CHART (may have FT)  Chief Complaint  Patient presents with  . New Admit To SNF    from hospital    HPI:  83 yo male seen today as a new admission into SNF following hospital stay for HCAP, dehydration, hypernatremia, AKI, change in MS, bradycardia, dementia. He failed o/p abx for pneumonia. CXR showed RLL infiltrate. He was started on IV abx vanco and zosyn --> zosyn alone --> po augmentin. Cr 1.28-->0.82; albumin 2.8; WBC 12.6K-->9.1K; abs neutrophils 10.7K; Hgb 15.9-->12.7; Plts 437K-->294K; K dropped to 3-->3.8; Na 155-->140 at d/c. He presents to SNF for short term rehab.  Today he is lethargic and only opens eyes with sternal rub. Nonverbal today. He is a poor historian due to dementia. Hx obtained from chart  Bradycardia -  no symptoms present  Depression with anxiety - mood stable on celexa 20 mg daily; xanax 0.5 mg every  6 hours as needed  GERD - he has hx of balloon dilatation. Takes prilosec 20 mg daily  Dementia - stable on namenda 10 mg twice daily   Protein calorie malnutrition - current weight is 133 pounds; albumin 2.8; he gets supplements per facility protocol   Chronic pain syndrome - uncontrolled on tylenol 325 mg twice daily and prn vicodin 5/325  Hx vitamin B12 deficiency - B12 level 2655. Off supplement    Past Medical History:  Diagnosis Date  . Cancer [facility])    Prostate  . Dementia    Alzheimers  . GERD  (gastroesophageal reflux disease)   . Heart murmur    systolic murmur no further eval d/t Alzheimers dx  . Hematuria   . Pulmonary nodule 08/17/2014  . Stroke [facility])    TIA    Past Surgical History:  Procedure Laterality Date  . BALLOON DILATION [name] 07/03/2014   Procedure: BALLOON DILATION;  Surgeon: Garlan Fair, MD;  Location: [facility];  Service: Endoscopy;  Laterality: [name];  . BALLOON DILATION [name] 06/03/2015   Procedure: BALLOON DILATION;  Surgeon: Garlan Fair, MD;  Location: [facility];  Service: Endoscopy;  Laterality: [name];  . CYSTOSCOPY [name] 11/07/2014   Procedure: CYSTOSCOPY;  Surgeon: Alexis Frock, MD;  Location: [facility];  Service: Urology;  Laterality: [name];  . ESOPHAGOGASTRODUODENOSCOPY [name] 07/03/2014   Procedure: ESOPHAGOGASTRODUODENOSCOPY (EGD);  Surgeon: Garlan Fair, MD;  Location: [facility];  Service: Endoscopy;  Laterality: [name];  . ESOPHAGOGASTRODUODENOSCOPY (EGD) WITH PROPOFOL [name] 06/03/2015   Procedure: ESOPHAGOGASTRODUODENOSCOPY (EGD) WITH PROPOFOL;  Surgeon: Garlan Fair, MD;  Location: [facility];  Service: Endoscopy;  Laterality: [name];  . HERNIA REPAIR  1970   double  . seed implant for prostate cancer  2006  . TRANSURETHRAL RESECTION OF PROSTATE [name] 11/07/2014   Procedure: TRANSURETHRAL VAPORIZATION OF THE PROSTATE AND BLADDER LESIONS WITH ERBE (NEW GYRUS) REMOVAL OF BLADDER STONE; PROSTATIC URETHRA BIOPSY;  Surgeon: Alexis Frock, MD;  Location: [facility];  Service: Urology;  Laterality: [name];  Patient Care Team: Lajean Manes, MD as PCP - General (Internal Medicine)  Social History   Social History  . Marital status: Married    Spouse name: [name]  . Number of children: [name]  . Years of education: [name]   Occupational History  . Not on file.   Social History Main Topics  . Smoking status: Former Smoker    Packs/day: 1.00    Years: 10.00    Types: Cigarettes    Quit date: 08/31/1968  . Smokeless tobacco: Never Used  . Alcohol use No  .  Drug use: No  . Sexual activity: No   Other Topics Concern  . Not on file   Social History Narrative  . No narrative on file     reports that he quit smoking about 48 years ago. His smoking use included Cigarettes. He has a 10.00 pack-year smoking history. He has never used smokeless tobacco. He reports that he does not drink alcohol or use drugs.  Family History  Problem Relation Age of Onset  . Heart disease Mother    Family Status  Relation Status  . Mother      There is no immunization history on file for this patient.  No Known Allergies  Medications: Patient's Medications  New Prescriptions   No medications on file  Previous Medications   ACETAMINOPHEN (TYLENOL) 325 MG TABLET    Take 650 mg by mouth 2 (two) times daily.    ALPRAZOLAM (XANAX) 0.5 MG TABLET    Take 1 tablet (0.5 mg total) by mouth every 6 (six) hours as needed (for aggitation).   AMOXICILLIN-CLAVULANATE (AUGMENTIN) 875-125 MG TABLET    Take 1 tablet by mouth 2 (two) times daily. For 2 more days   CALCIUM-VITAMIN D-VITAMIN K (VIACTIV) 841-324-40 MG-UNT-MCG CHEW    Chew 2 tablets by mouth 2 (two) times daily.   CITALOPRAM (CELEXA) 20 MG TABLET    Take 20 mg by mouth at bedtime.   COENZYME Q10 (CO Q 10) 100 MG CAPS    Take 1 capsule by mouth daily with breakfast.    CYANOCOBALAMIN 1000 MCG TABLET    Take 1,000 mcg by mouth every morning.    HYDROCODONE-ACETAMINOPHEN (NORCO/VICODIN) 5-325 MG TABLET    Take 1-2 tablets by mouth every 4 (four) hours as needed for moderate pain.   MEMANTINE (NAMENDA) 10 MG TABLET    Take 10 mg by mouth 2 (two) times daily.    OMEPRAZOLE (PRILOSEC) 20 MG CAPSULE    Take 20 mg by mouth daily before breakfast.    ONDANSETRON 4 MG FILM    Take 4 mg by mouth every 8 (eight) hours as needed.   RED YEAST RICE 600 MG CAPS    Take 1,200 mg by mouth daily with breakfast.  Modified Medications   No medications on file  Discontinued Medications   No medications on file    Review of  Systems  Unable to perform ROS: Dementia    Vitals:   11/09/16 1146  BP: (!) 148/66  Pulse: 62  Temp: 98.6 F (37 C)  SpO2: 94%  Weight: 133 lb 9.6 oz (60.6 kg)   Body mass index is 19.17 kg/m.  Physical Exam  Constitutional: He appears well-developed. He appears lethargic.  Frail appearing in NAD, lying in bed resting but awakens with sternal rub  HENT:  Mouth/Throat: Oropharynx is clear and moist.  Eyes: Pupils are equal, round, and reactive to light. No scleral icterus.  Neck: Neck supple. Carotid  bruit is present (b/l systolic). No thyromegaly present.  Cardiovascular: Normal rate, regular rhythm and intact distal pulses.  Exam reveals no gallop and no friction rub.   Murmur (2/6 SEM -->carotid b/l) heard. no distal LE swelling. No calf TTP  Pulmonary/Chest: Effort normal and breath sounds normal. He has no wheezes. He has no rales. He exhibits no tenderness.  Abdominal: Soft. Bowel sounds are normal. He exhibits no distension, no abdominal bruit, no pulsatile midline mass and no mass. There is no hepatomegaly. There is no tenderness. There is no rebound and no guarding.  Musculoskeletal: He exhibits edema.  Lymphadenopathy:    He has no cervical adenopathy.  Neurological: He appears lethargic.  Skin: Skin is warm and dry. No rash noted.  Psychiatric: He has a normal mood and affect. His behavior is normal.     Labs reviewed: Admission on 11/01/2016, Discharged on 11/05/2016  Component Date Value Ref Range Status  . WBC 11/01/2016 12.6* 4.0 - 10.5 K/uL Final  . RBC 11/01/2016 5.42  4.22 - 5.81 MIL/uL Final  . Hemoglobin 11/01/2016 15.9  13.0 - 17.0 g/dL Final  . HCT 11/01/2016 48.5  39.0 - 52.0 % Final  . MCV 11/01/2016 89.5  78.0 - 100.0 fL Final  . MCH 11/01/2016 29.3  26.0 - 34.0 pg Final  . MCHC 11/01/2016 32.8  30.0 - 36.0 g/dL Final  . RDW 11/01/2016 14.9  11.5 - 15.5 % Final  . Platelets 11/01/2016 437* 150 - 400 K/uL Final  . Neutrophils Relative %  11/01/2016 84  % Final  . Neutro Abs 11/01/2016 10.7* 1.7 - 7.7 K/uL Final  . Lymphocytes Relative 11/01/2016 8  % Final  . Lymphs Abs 11/01/2016 1.0  0.7 - 4.0 K/uL Final  . Monocytes Relative 11/01/2016 7  % Final  . Monocytes Absolute 11/01/2016 0.8  0.1 - 1.0 K/uL Final  . Eosinophils Relative 11/01/2016 1  % Final  . Eosinophils Absolute 11/01/2016 0.1  0.0 - 0.7 K/uL Final  . Basophils Relative 11/01/2016 0  % Final  . Basophils Absolute 11/01/2016 0.0  0.0 - 0.1 K/uL Final  . Sodium 11/01/2016 155* 135 - 145 mmol/L Final  . Potassium 11/01/2016 3.8  3.5 - 5.1 mmol/L Final  . Chloride 11/01/2016 120* 101 - 111 mmol/L Final  . CO2 11/01/2016 27  22 - 32 mmol/L Final  . Glucose, Bld 11/01/2016 122* 65 - 99 mg/dL Final  . BUN 11/01/2016 40* 6 - 20 mg/dL Final  . Creatinine, Ser 11/01/2016 1.28* 0.61 - 1.24 mg/dL Final  . Calcium 11/01/2016 9.7  8.9 - 10.3 mg/dL Final  . GFR calc non Af Amer 11/01/2016 48* >60 mL/min Final  . GFR calc Af Amer 11/01/2016 56* >60 mL/min Final   Comment: (NOTE) The eGFR has been calculated using the CKD EPI equation. This calculation has not been validated in all clinical situations. eGFR's persistently <60 mL/min signify possible Chronic Kidney Disease.   . Anion gap 11/01/2016 8  5 - 15 Final  . Color, Urine 11/01/2016 YELLOW  YELLOW Final  . APPearance 11/01/2016 CLOUDY* CLEAR Final  . Specific Gravity, Urine 11/01/2016 1.023  1.005 - 1.030 Final  . pH 11/01/2016 5.0  5.0 - 8.0 Final  . Glucose, UA 11/01/2016 NEGATIVE  NEGATIVE mg/dL Final  . Hgb urine dipstick 11/01/2016 MODERATE* NEGATIVE Final  . Bilirubin Urine 11/01/2016 NEGATIVE  NEGATIVE Final  . Ketones, ur 11/01/2016 NEGATIVE  NEGATIVE mg/dL Final  . Protein, ur 11/01/2016 30* NEGATIVE mg/dL  Final  . Nitrite 11/01/2016 NEGATIVE  NEGATIVE Final  . Leukocytes, UA 11/01/2016 MODERATE* NEGATIVE Final  . RBC / HPF 11/01/2016 TOO NUMEROUS TO COUNT  0 - 5 RBC/hpf Final  . WBC, UA 11/01/2016  TOO NUMEROUS TO COUNT  0 - 5 WBC/hpf Final  . Bacteria, UA 11/01/2016 RARE* NONE SEEN Final  . Squamous Epithelial / LPF 11/01/2016 0-5* NONE SEEN Final  . WBC Clumps 11/01/2016 PRESENT   Final  . Mucous 11/01/2016 PRESENT   Final  . Hyaline Casts, UA 11/01/2016 PRESENT   Final  . Specimen Description 11/01/2016 BLOOD LEFT HAND   Final  . Special Requests 11/01/2016 IN PEDIATRIC BOTTLE 1CC   Final  . Culture 11/01/2016    Final                   Value:NO GROWTH 5 DAYS Performed at [facility], [place] [address], [place], [place] [address]   . Report Status 11/01/2016 11/06/2016 FINAL   Final  . Specimen Description 11/01/2016 BLOOD BLOOD RIGHT FOREARM   Final  . Special Requests 11/01/2016 IN PEDIATRIC BOTTLE 3.5CC   Final  . Culture 11/01/2016    Final                   Value:NO GROWTH 5 DAYS Performed at [facility], [place] [address] [place], [place] [address]   . Report Status 11/01/2016 11/06/2016 FINAL   Final  . Specimen Description 11/01/2016 BLOOD RIGHT HAND   Final  . Special Requests 11/01/2016 IN PEDIATRIC BOTTLE 2CC   Final  . Culture 11/01/2016    Final                   Value:NO GROWTH 5 DAYS Performed at [facility], [place] [address], [place], [place] [address]   . Report Status 11/01/2016 11/06/2016 FINAL   Final  . Specimen Description 11/01/2016 BLOOD RIGHT HAND   Final  . Special Requests 11/01/2016 IN PEDIATRIC BOTTLE 1CC   Final  . Culture 11/01/2016    Final                   Value:NO GROWTH 5 DAYS Performed at [facility], [place] [address], [place], [place] [address]   . Report Status 11/01/2016 11/06/2016 FINAL   Final  . Specimen Description 11/03/2016 EXPECTORATED SPUTUM   Final  . Special Requests 11/03/2016 NONE   Final  . Sputum evaluation 11/03/2016 Sputum specimen not acceptable for testing.  Please recollect.     Final  . Report Status 11/03/2016 11/03/2016 FINAL   Final  . HIV Screen 4th Generation wRfx 11/01/2016 Non Reactive   Non Reactive Final   Comment: (NOTE) Performed At: [facility] [place], [place] [id number] Lindon Romp MD IA:[id number]   . Strep Pneumo Urinary Antigen 11/01/2016 NEGATIVE  NEGATIVE Final   Comment:        Infection due to S. pneumoniae cannot be absolutely ruled out since the antigen present may be below the detection limit of the test. Performed at [facility], [address] [place], [place] [address]   . TSH 11/01/2016 1.980  0.350 - 4.500 uIU/mL Final  . Specimen Description 11/01/2016 URINE, RANDOM   Final  . Special Requests 11/01/2016 NONE   Final  . Culture 11/01/2016    Final  Value:NO GROWTH Performed at [facility], [place] [address], [place],  [address]   . Report Status 11/01/2016 11/03/2016 FINAL   Final  . B Natriuretic Peptide 11/01/2016 68.5  0.0 - 100.0 pg/mL Final  . Troponin I 11/01/2016 0.07* <0.03 ng/mL Final   Comment: CRITICAL RESULT CALLED TO, READ BACK BY AND VERIFIED WITH: S RIGHT RN [id number] 11/01/16 A NAVARRO   . Troponin I 11/02/2016 0.06* <0.03 ng/mL Final  . Troponin I 11/02/2016 0.06* <0.03 ng/mL Final  . Sodium 11/02/2016 154* 135 - 145 mmol/L Final  . Potassium 11/02/2016 3.4* 3.5 - 5.1 mmol/L Final  . Chloride 11/02/2016 119* 101 - 111 mmol/L Final  . CO2 11/02/2016 27  22 - 32 mmol/L Final  . Glucose, Bld 11/02/2016 124* 65 - 99 mg/dL Final  . BUN 11/02/2016 32* 6 - 20 mg/dL Final  . Creatinine, Ser 11/02/2016 1.25* 0.61 - 1.24 mg/dL Final  . Calcium 11/02/2016 9.1  8.9 - 10.3 mg/dL Final  . Total Protein 11/02/2016 6.3* 6.5 - 8.1 g/dL Final  . Albumin 11/02/2016 2.8* 3.5 - 5.0 g/dL Final  . AST 11/02/2016 48* 15 - 41 U/L Final  . ALT 11/02/2016 21  17 - 63 U/L Final  . Alkaline Phosphatase 11/02/2016 70  38 - 126 U/L Final  . Total Bilirubin 11/02/2016 1.7* 0.3 - 1.2 mg/dL Final  . GFR calc non Af Amer 11/02/2016 50* >60 mL/min Final  . GFR calc Af Amer 11/02/2016 57* >60  mL/min Final   Comment: (NOTE) The eGFR has been calculated using the CKD EPI equation. This calculation has not been validated in all clinical situations. eGFR's persistently <60 mL/min signify possible Chronic Kidney Disease.   . Anion gap 11/02/2016 8  5 - 15 Final  . WBC 11/02/2016 9.2  4.0 - 10.5 K/uL Final  . RBC 11/02/2016 5.20  4.22 - 5.81 MIL/uL Final  . Hemoglobin 11/02/2016 15.4  13.0 - 17.0 g/dL Final  . HCT 11/02/2016 46.5  39.0 - 52.0 % Final  . MCV 11/02/2016 89.4  78.0 - 100.0 fL Final  . MCH 11/02/2016 29.6  26.0 - 34.0 pg Final  . MCHC 11/02/2016 33.1  30.0 - 36.0 g/dL Final  . RDW 11/02/2016 14.6  11.5 - 15.5 % Final  . Platelets 11/02/2016 338  150 - 400 K/uL Final  . L. pneumophila Serogp 1 Ur Ag 11/01/2016 Negative  Negative Final   Comment: (NOTE) Presumptive negative for L. pneumophila serogroup 1 antigen in urine, suggesting no recent or current infection. Legionnaires' disease cannot be ruled out since other serogroups and species may also cause disease. Performed At: [facility] [place], [place] [id number] Lindon Romp MD [id number]   . Source of Sample 11/01/2016 URINE, RANDOM   Final  . Influenza A By PCR 11/01/2016 NEGATIVE  NEGATIVE Final  . Influenza B By PCR 11/01/2016 NEGATIVE  NEGATIVE Final   Comment: (NOTE) The Xpert Xpress Flu assay is intended as an aid in the diagnosis of  influenza and should not be used as a sole basis for treatment.  This  assay is FDA approved for nasopharyngeal swab specimens only. Nasal  washings and aspirates are unacceptable for Xpert Xpress Flu testing.   [name] MRSA by PCR 11/02/2016 NEGATIVE  NEGATIVE Final   Comment:        The GeneXpert MRSA Assay (FDA approved for NASAL specimens only), is one component of a comprehensive MRSA colonization surveillance program. It is  not intended to diagnose MRSA infection nor to guide or monitor treatment for MRSA infections.   . Ammonia  11/03/2016 49* 9 - 35 umol/L Final  . Vitamin B-12 11/03/2016 2655* 180 - 914 pg/mL Final   Comment: (NOTE) This assay is not validated for testing neonatal or myeloproliferative syndrome specimens for Vitamin B12 levels. Performed at [facility], [place] [address], [place], [place] [address]   . Magnesium 11/03/2016 2.0  1.7 - 2.4 mg/dL Final  . Sodium 11/03/2016 146* 135 - 145 mmol/L Final  . Potassium 11/03/2016 3.0* 3.5 - 5.1 mmol/L Final  . Chloride 11/03/2016 114* 101 - 111 mmol/L Final  . CO2 11/03/2016 25  22 - 32 mmol/L Final  . Glucose, Bld 11/03/2016 99  65 - 99 mg/dL Final  . BUN 11/03/2016 19  6 - 20 mg/dL Final  . Creatinine, Ser 11/03/2016 1.18  0.61 - 1.24 mg/dL Final  . Calcium 11/03/2016 8.7* 8.9 - 10.3 mg/dL Final  . GFR calc non Af Amer 11/03/2016 53* >60 mL/min Final  . GFR calc Af Amer 11/03/2016 >60  >60 mL/min Final   Comment: (NOTE) The eGFR has been calculated using the CKD EPI equation. This calculation has not been validated in all clinical situations. eGFR's persistently <60 mL/min signify possible Chronic Kidney Disease.   . Anion gap 11/03/2016 7  5 - 15 Final  . WBC 11/03/2016 7.1  4.0 - 10.5 K/uL Final  . RBC 11/03/2016 4.57  4.22 - 5.81 MIL/uL Final  . Hemoglobin 11/03/2016 13.1  13.0 - 17.0 g/dL Final  . HCT 11/03/2016 41.3  39.0 - 52.0 % Final  . MCV 11/03/2016 90.4  78.0 - 100.0 fL Final  . MCH 11/03/2016 28.7  26.0 - 34.0 pg Final  . MCHC 11/03/2016 31.7  30.0 - 36.0 g/dL Final  . RDW 11/03/2016 14.6  11.5 - 15.5 % Final  . Platelets 11/03/2016 288  150 - 400 K/uL Final  . Glucose-Capillary 11/03/2016 88  65 - 99 mg/dL Final  . WBC 11/04/2016 9.1  4.0 - 10.5 K/uL Final  . RBC 11/04/2016 4.49  4.22 - 5.81 MIL/uL Final  . Hemoglobin 11/04/2016 12.7* 13.0 - 17.0 g/dL Final  . HCT 11/04/2016 39.2  39.0 - 52.0 % Final  . MCV 11/04/2016 87.3  78.0 - 100.0 fL Final  . MCH 11/04/2016 28.3  26.0 - 34.0 pg Final  . MCHC 11/04/2016 32.4  30.0 -  36.0 g/dL Final  . RDW 11/04/2016 14.3  11.5 - 15.5 % Final  . Platelets 11/04/2016 294  150 - 400 K/uL Final  . Sodium 11/04/2016 140  135 - 145 mmol/L Final  . Potassium 11/04/2016 3.8  3.5 - 5.1 mmol/L Final   Comment: DELTA CHECK NOTED NO VISIBLE HEMOLYSIS   . Chloride 11/04/2016 114* 101 - 111 mmol/L Final  . CO2 11/04/2016 20* 22 - 32 mmol/L Final  . Glucose, Bld 11/04/2016 93  65 - 99 mg/dL Final  . BUN 11/04/2016 15  6 - 20 mg/dL Final  . Creatinine, Ser 11/04/2016 0.82  0.61 - 1.24 mg/dL Final  . Calcium 11/04/2016 8.3* 8.9 - 10.3 mg/dL Final  . GFR calc non Af Amer 11/04/2016 >60  >60 mL/min Final  . GFR calc Af Amer 11/04/2016 >60  >60 mL/min Final   Comment: (NOTE) The eGFR has been calculated using the CKD EPI equation. This calculation has not been validated in all clinical situations. eGFR's persistently <60 mL/min signify possible Chronic  Kidney Disease.   . Anion gap 11/04/2016 6  5 - 15 Final  . Ammonia 11/04/2016 31  9 - 35 umol/L Final  . Glucose-Capillary 11/04/2016 93  65 - 99 mg/dL Final  Admission on 10/21/2016, Discharged on 10/21/2016  Component Date Value Ref Range Status  . Color, Urine 10/21/2016 YELLOW  YELLOW Final  . APPearance 10/21/2016 HAZY* CLEAR Final  . Specific Gravity, Urine 10/21/2016 1.024  1.005 - 1.030 Final  . pH 10/21/2016 5.0  5.0 - 8.0 Final  . Glucose, UA 10/21/2016 NEGATIVE  NEGATIVE mg/dL Final  . Hgb urine dipstick 10/21/2016 LARGE* NEGATIVE Final  . Bilirubin Urine 10/21/2016 NEGATIVE  NEGATIVE Final  . Ketones, ur 10/21/2016 20* NEGATIVE mg/dL Final  . Protein, ur 10/21/2016 30* NEGATIVE mg/dL Final  . Nitrite 10/21/2016 NEGATIVE  NEGATIVE Final  . Leukocytes, UA 10/21/2016 SMALL* NEGATIVE Final  . RBC / HPF 10/21/2016 TOO NUMEROUS TO COUNT  0 - 5 RBC/hpf Final  . WBC, UA 10/21/2016 TOO NUMEROUS TO COUNT  0 - 5 WBC/hpf Final  . Bacteria, UA 10/21/2016 RARE* NONE SEEN Final  . Squamous Epithelial / LPF 10/21/2016 0-5* NONE  SEEN Final  . Mucous 10/21/2016 PRESENT   Final  . Hyaline Casts, UA 10/21/2016 PRESENT   Final  . Non Squamous Epithelial 10/21/2016 0-5* NONE SEEN Final  . Sodium 10/21/2016 142  135 - 145 mmol/L Final  . Potassium 10/21/2016 4.0  3.5 - 5.1 mmol/L Final  . Chloride 10/21/2016 105  101 - 111 mmol/L Final  . CO2 10/21/2016 28  22 - 32 mmol/L Final  . Glucose, Bld 10/21/2016 110* 65 - 99 mg/dL Final  . BUN 10/21/2016 37* 6 - 20 mg/dL Final  . Creatinine, Ser 10/21/2016 1.42* 0.61 - 1.24 mg/dL Final  . Calcium 10/21/2016 9.3  8.9 - 10.3 mg/dL Final  . GFR calc non Af Amer 10/21/2016 43* >60 mL/min Final  . GFR calc Af Amer 10/21/2016 49* >60 mL/min Final   Comment: (NOTE) The eGFR has been calculated using the CKD EPI equation. This calculation has not been validated in all clinical situations. eGFR's persistently <60 mL/min signify possible Chronic Kidney Disease.   . Anion gap 10/21/2016 9  5 - 15 Final  . WBC 10/21/2016 7.8  4.0 - 10.5 K/uL Final  . RBC 10/21/2016 5.06  4.22 - 5.81 MIL/uL Final  . Hemoglobin 10/21/2016 15.1  13.0 - 17.0 g/dL Final  . HCT 10/21/2016 44.9  39.0 - 52.0 % Final  . MCV 10/21/2016 88.7  78.0 - 100.0 fL Final  . MCH 10/21/2016 29.8  26.0 - 34.0 pg Final  . MCHC 10/21/2016 33.6  30.0 - 36.0 g/dL Final  . RDW 10/21/2016 14.7  11.5 - 15.5 % Final  . Platelets 10/21/2016 149* 150 - 400 K/uL Final  . Neutrophils Relative % 10/21/2016 87  % Final  . Neutro Abs 10/21/2016 6.8  1.7 - 7.7 K/uL Final  . Lymphocytes Relative 10/21/2016 7  % Final  . Lymphs Abs 10/21/2016 0.5* 0.7 - 4.0 K/uL Final  . Monocytes Relative 10/21/2016 6  % Final  . Monocytes Absolute 10/21/2016 0.5  0.1 - 1.0 K/uL Final  . Eosinophils Relative 10/21/2016 0  % Final  . Eosinophils Absolute 10/21/2016 0.0  0.0 - 0.7 K/uL Final  . Basophils Relative 10/21/2016 0  % Final  . Basophils Absolute 10/21/2016 0.0  0.0 - 0.1 K/uL Final  . Influenza A By PCR 10/21/2016 NEGATIVE  NEGATIVE Final   .  Influenza B By PCR 10/21/2016 NEGATIVE  NEGATIVE Final   Comment: (NOTE) The Xpert Xpress Flu assay is intended as an aid in the diagnosis of  influenza and should not be used as a sole basis for treatment.  This  assay is FDA approved for nasopharyngeal swab specimens only. Nasal  washings and aspirates are unacceptable for Xpert Xpress Flu testing.   [name] Specimen Description 10/21/2016 URINE, RANDOM   Final  . Special Requests 10/21/2016 NONE   Final  . Culture 10/21/2016 MULTIPLE SPECIES PRESENT, SUGGEST RECOLLECTION*  Final  . Report Status 10/21/2016 10/22/2016 FINAL   Final  Admission on 10/18/2016, Discharged on 10/18/2016  Component Date Value Ref Range Status  . Sodium 10/18/2016 142  135 - 145 mmol/L Final  . Potassium 10/18/2016 3.5  3.5 - 5.1 mmol/L Final  . Chloride 10/18/2016 102  101 - 111 mmol/L Final  . BUN 10/18/2016 11  6 - 20 mg/dL Final  . Creatinine, Ser 10/18/2016 0.90  0.61 - 1.24 mg/dL Final  . Glucose, Bld 10/18/2016 102* 65 - 99 mg/dL Final  . Calcium, Ion 10/18/2016 1.16  1.15 - 1.40 mmol/L Final  . TCO2 10/18/2016 28  0 - 100 mmol/L Final  . Hemoglobin 10/18/2016 15.0  13.0 - 17.0 g/dL Final  . HCT 10/18/2016 44.0  39.0 - 52.0 % Final    Dg Chest 2 View  Result Date: 10/21/2016 CLINICAL DATA:  Cough, history of prostate cancer EXAM: CHEST  2 VIEW COMPARISON:  10/18/2016 FINDINGS: Cardiomediastinal silhouette is stable. Nodule in left midlung is stable in size in appearance from prior exam measures about 7.6 mm. Mild perihilar and infrahilar increased bronchial and interstitial markings bilaterally. Bronchitic changes or viral pneumonitis cannot be excluded. Clinical correlation is necessary. Less likely pulmonary edema. Bony thorax is unremarkable. IMPRESSION: Nodule in left midlung is stable in size in appearance from prior exam measures about 7.6 mm. Mild perihilar and infrahilar increased bronchial and interstitial markings bilaterally. Bronchitic changes  or viral pneumonitis cannot be excluded. Clinical correlation is necessary. Less likely pulmonary edema. Electronically Signed   By: Lahoma Crocker M.D.   On: 10/21/2016 10:45   Dg Cervical Spine Complete  Result Date: 10/18/2016 CLINICAL DATA:  Found on floor, presumed fall. History of back and neck pain. Initial encounter. EXAM: CERVICAL SPINE - COMPLETE 4+ VIEW COMPARISON:  09/25/2016 FINDINGS: Limited visualization of the cervicothoracic junction in the lateral projection, but normally aligned based on the obliques. No visible fracture or prevertebral thickening. No traumatic malalignment. Diffuse advanced disc degeneration. There is C5-6 interbody fusion or ankylosis. Diffuse facet arthropathy with spurring greatest at C3-4 and C4-5. IMPRESSION: 1. No evidence of cervical spine injury. 2. Diffuse advanced spinal degeneration. Electronically Signed   By: Monte Fantasia M.D.   On: 10/18/2016 10:51   Dg Thoracic Spine 2 View  Result Date: 10/21/2016 CLINICAL DATA:  Thoracic spine pain since a fall this morning. Initial encounter. EXAM: THORACIC SPINE 2 VIEWS COMPARISON:  None. FINDINGS: There is no evidence of thoracic spine fracture. Alignment is normal. No other significant bone abnormalities are identified. IMPRESSION: No acute abnormality. Electronically Signed   By: Inge Rise M.D.   On: 10/21/2016 09:53   Dg Lumbar Spine Complete  Result Date: 10/21/2016 CLINICAL DATA:  Unwitnessed fall loss morning.  Back pain. EXAM: LUMBAR SPINE - COMPLETE 4+ VIEW COMPARISON:  10/18/2016 lumbar spine radiographs. FINDINGS: This report assumes 5 non rib-bearing lumbar vertebrae. Minimal levocurvature of the lumbar spine. Lumbar vertebral body  heights are preserved, with no fracture. Mild multilevel degenerative disc disease throughout the lumbar spine, most prominent at L2-3, unchanged. Minimal 2 mm retrolisthesis at L2-3, stable. No new spondylolisthesis. Mild-to-moderate facet arthropathy bilaterally in the  lower lumbar spine. No aggressive appearing focal osseous lesions. Abdominal aortic atherosclerosis. Brachytherapy seeds overlie the lower pelvis on the coned lateral view. Stable tiny metallic density fragments throughout the bilateral abdomen. IMPRESSION: 1. No lumbar spine fracture or acute malalignment. 2. Stable mild multilevel lumbar degenerative disc disease, mild-to-moderate lower lumbar facet arthropathy and minimal degenerative retrolisthesis at L2-3. 3. Aortic atherosclerosis. Electronically Signed   By: Ilona Sorrel M.D.   On: 10/21/2016 09:56   Dg Lumbar Spine Complete  Result Date: 10/18/2016 CLINICAL DATA:  83 year old male with history of trauma from a fall this morning complaining of low back pain. EXAM: LUMBAR SPINE - COMPLETE 4+ VIEW COMPARISON:  No priors. FINDINGS: Multiple views is of the lumbar spine demonstrate no acute displaced fracture or definite compression type fracture. Alignment is anatomic. No defects of the pars interarticularis are noted. Mild multilevel degenerative disc disease and facet arthropathy, most pronounced at L4-L5 and L5-S1. Extensive aortic atherosclerosis. IMPRESSION: 1. No acute radiographic abnormality of the lumbar spine. 2. Mild multilevel degenerative disc disease and lumbar spondylosis, as above. 3. Aortic atherosclerosis. Electronically Signed   By: Vinnie Langton M.D.   On: 10/18/2016 10:56   Ct Head Wo Contrast  Result Date: 10/18/2016 CLINICAL DATA:  83 year old male with history of trauma from a fall. Emesis. EXAM: CT HEAD WITHOUT CONTRAST CT CERVICAL SPINE WITHOUT CONTRAST TECHNIQUE: Multidetector CT imaging of the head and cervical spine was performed following the standard protocol without intravenous contrast. Multiplanar CT image reconstructions of the cervical spine were also generated. COMPARISON:  Multiple priors, most recently head CT 09/25/2016 and cervical spine 09/25/2016. FINDINGS: CT HEAD FINDINGS Brain: Mild cerebral atrophy.  Patchy and confluent areas of decreased attenuation are noted throughout the deep and periventricular white matter of the cerebral hemispheres bilaterally, compatible with chronic microvascular ischemic disease. No evidence of acute infarction, hemorrhage, hydrocephalus, extra-axial collection or mass lesion/mass effect. Vascular: No hyperdense vessel or unexpected calcification. Skull: Normal. Negative for fracture or focal lesion. Sinuses/Orbits: No acute finding. Other: None. CT CERVICAL SPINE FINDINGS Alignment: Normal. Skull base and vertebrae: No acute fracture. No primary bone lesion or focal pathologic process. Soft tissues and spinal canal: No prevertebral fluid or swelling. No visible canal hematoma. Disc levels: Multilevel degenerative disc disease, most severe at C5-C6, C6-C7 and C7-T1. At C5-C6 there is near complete bony fusion. Multilevel facet arthropathy. Upper chest: Visualized portions of the upper thorax are unremarkable. Other: None. IMPRESSION: 1. No evidence of significant acute traumatic injury to the skull, brain or cervical spine. 2. Mild cerebral atrophy with extensive chronic microvascular ischemic changes in the cerebral white matter redemonstrated. 3. Multilevel degenerative disc disease and cervical spondylosis, as above. Electronically Signed   By: Vinnie Langton M.D.   On: 10/18/2016 17:51   Ct Cervical Spine Wo Contrast  Result Date: 10/18/2016 CLINICAL DATA:  83 year old male with history of trauma from a fall. Emesis. EXAM: CT HEAD WITHOUT CONTRAST CT CERVICAL SPINE WITHOUT CONTRAST TECHNIQUE: Multidetector CT imaging of the head and cervical spine was performed following the standard protocol without intravenous contrast. Multiplanar CT image reconstructions of the cervical spine were also generated. COMPARISON:  Multiple priors, most recently head CT 09/25/2016 and cervical spine 09/25/2016. FINDINGS: CT HEAD FINDINGS Brain: Mild cerebral atrophy. Patchy and confluent  areas of decreased attenuation are noted throughout the deep and periventricular white matter of the cerebral hemispheres bilaterally, compatible with chronic microvascular ischemic disease. No evidence of acute infarction, hemorrhage, hydrocephalus, extra-axial collection or mass lesion/mass effect. Vascular: No hyperdense vessel or unexpected calcification. Skull: Normal. Negative for fracture or focal lesion. Sinuses/Orbits: No acute finding. Other: None. CT CERVICAL SPINE FINDINGS Alignment: Normal. Skull base and vertebrae: No acute fracture. No primary bone lesion or focal pathologic process. Soft tissues and spinal canal: No prevertebral fluid or swelling. No visible canal hematoma. Disc levels: Multilevel degenerative disc disease, most severe at C5-C6, C6-C7 and C7-T1. At C5-C6 there is near complete bony fusion. Multilevel facet arthropathy. Upper chest: Visualized portions of the upper thorax are unremarkable. Other: None. IMPRESSION: 1. No evidence of significant acute traumatic injury to the skull, brain or cervical spine. 2. Mild cerebral atrophy with extensive chronic microvascular ischemic changes in the cerebral white matter redemonstrated. 3. Multilevel degenerative disc disease and cervical spondylosis, as above. Electronically Signed   By: Vinnie Langton M.D.   On: 10/18/2016 17:51   Dg Chest Port 1 View  Result Date: 11/01/2016 CLINICAL DATA:  AMS x 2 weeks; hx heart murmur per chart EXAM: PORTABLE CHEST 1 VIEW COMPARISON:  10/21/2016 FINDINGS: Heart size is normal. Aorta is tortuous. There is patchy infiltrate in the medial right lung base, consistent with infectious process. No pulmonary edema. IMPRESSION: Right lower lobe infiltrate. Electronically Signed   By: Nolon Nations M.D.   On: 11/01/2016 15:23   Dg Chest Port 1 View  Result Date: 10/18/2016 CLINICAL DATA:  Pain vomiting and unwitnessed fall. EXAM: PORTABLE CHEST 1 VIEW COMPARISON:  06/18/2016. FINDINGS: 1611 hours.  Rightward patient rotation displaces cardiomediastinal anatomy and in the medial right hemithorax. Prominence of the cardiomediastinal contour likely related to AP supine technique. The lungs are clear wiithout focal pneumonia, edema, pneumothorax or pleural effusion. 6 mm left mid lung nodule again noted. Interstitial markings are diffusely coarsened with chronic features. The visualized bony structures of the thorax are intact. IMPRESSION: 1. Stable 6 mm nodule left mid lung. 2. No other acute cardiopulmonary findings. Electronically Signed   By: Misty Stanley M.D.   On: 10/18/2016 16:51     Assessment/Plan   ICD-9-CM ICD-10-CM   1. Other chronic pain 338.29 G89.29    uncontrolled  2. Protein-calorie malnutrition, severe ([place]) 262 E43   3. Vascular dementia without behavioral disturbance 290.40 F01.50   4. HCAP (healthcare-associated pneumonia) 24 J18.9   5. Depression with anxiety 300.4 F41.8   6. GERD without esophagitis 530.81 K21.9   7. Physical deconditioning 799.3 R53.81      Increase tylenol 2 tabs BID for pain  D/c all norco Rx  Start Oxy IR 25m q6hr prn mod-sev pain. Benefits of opioid tx outweigh risks at this time and will improve his QOL  Cont other meds as ordered. Finish abx (total of 7 days)  Palliative care consult  Check CMP  PT/OT/St as ordered  Aspiration precautions  GOAL: short term rehab with potential for long term care. Prognosis is guarded at this time. Communicated with pt and nursing.  Will follow  Amberlea Spagnuolo S. CPerlie Gold PMccandless Endoscopy Center LLCand Adult Medicine [address]GB[address] [place] [phone] (Monday-Friday 8 AM - 5 PM) ([phone] 5 PM and follow prompts

## 2016-11-10 LAB — BASIC METABOLIC PANEL
BUN: 13 mg/dL (ref 4–21)
BUN: 13 mg/dL (ref 4–21)
CREATININE: 0.7 mg/dL (ref 0.6–1.3)
Creatinine: 0.7 mg/dL (ref <=1.3)
Glucose: 86 mg/dL
Glucose: 86 mg/dL
POTASSIUM: 4.6 mmol/L (ref 3.4–5.3)
Potassium: 4.6 mmol/L (ref 3.4–5.3)
SODIUM: 141 mmol/L (ref 137–147)
Sodium: 141 mmol/L (ref 137–147)

## 2016-11-10 LAB — HEPATIC FUNCTION PANEL
ALK PHOS: 80 U/L (ref 25–125)
ALT: 17 U/L (ref 10–40)
ALT: 17 U/L (ref 10–40)
AST: 20 U/L (ref 14–40)
AST: 20 U/L (ref 14–40)
Alkaline Phosphatase: 80 U/L (ref 25–125)
BILIRUBIN, TOTAL: 0.3 mg/dL
Bilirubin, Total: 0.3 mg/dL

## 2016-12-02 DIAGNOSIS — R451 Restlessness and agitation: Secondary | ICD-10-CM | POA: Diagnosis not present

## 2016-12-04 DIAGNOSIS — R451 Restlessness and agitation: Secondary | ICD-10-CM | POA: Diagnosis not present

## 2016-12-08 ENCOUNTER — Non-Acute Institutional Stay (SKILLED_NURSING_FACILITY): Payer: Medicare Other | Admitting: Adult Health

## 2016-12-08 ENCOUNTER — Encounter: Payer: Self-pay | Admitting: Adult Health

## 2016-12-08 DIAGNOSIS — K219 Gastro-esophageal reflux disease without esophagitis: Secondary | ICD-10-CM | POA: Diagnosis not present

## 2016-12-08 DIAGNOSIS — R001 Bradycardia, unspecified: Secondary | ICD-10-CM

## 2016-12-08 DIAGNOSIS — G8929 Other chronic pain: Secondary | ICD-10-CM

## 2016-12-08 DIAGNOSIS — F0150 Vascular dementia without behavioral disturbance: Secondary | ICD-10-CM

## 2016-12-08 DIAGNOSIS — E43 Unspecified severe protein-calorie malnutrition: Secondary | ICD-10-CM

## 2016-12-08 DIAGNOSIS — E538 Deficiency of other specified B group vitamins: Secondary | ICD-10-CM

## 2016-12-08 DIAGNOSIS — F418 Other specified anxiety disorders: Secondary | ICD-10-CM | POA: Diagnosis not present

## 2016-12-08 NOTE — Progress Notes (Signed)
Location:   [facility] Room Number: 129 A Place of Service:  SNF (31)   CODE STATUS: DNR  No Known Allergies  Chief Complaint  Patient presents with  . Medical Management of Chronic Issues    1 month follow up    HPI:  He is a short term resident of this facility being seen for the management of his chronic illnesses. Overall there is little change in his status. He does keep his eyes closed; is unable to participate in the hpi or ros there are no nursing concerns at this time    Past Medical History:  Diagnosis Date  . Cancer [facility])    Prostate  . Dementia    Alzheimers  . GERD (gastroesophageal reflux disease)   . Heart murmur    systolic murmur no further eval d/t Alzheimers dx  . Hematuria   . Pulmonary nodule 08/17/2014  . Stroke [facility])    TIA    Past Surgical History:  Procedure Laterality Date  . BALLOON DILATION [name] 07/03/2014   Procedure: BALLOON DILATION;  Surgeon: Garlan Fair, MD;  Location: [facility];  Service: Endoscopy;  Laterality: [name];  . BALLOON DILATION [name] 06/03/2015   Procedure: BALLOON DILATION;  Surgeon: Garlan Fair, MD;  Location: [facility];  Service: Endoscopy;  Laterality: [name];  . CYSTOSCOPY [name] 11/07/2014   Procedure: CYSTOSCOPY;  Surgeon: Alexis Frock, MD;  Location: [facility];  Service: Urology;  Laterality: [name];  . ESOPHAGOGASTRODUODENOSCOPY [name] 07/03/2014   Procedure: ESOPHAGOGASTRODUODENOSCOPY (EGD);  Surgeon: Garlan Fair, MD;  Location: [facility];  Service: Endoscopy;  Laterality: [name];  . ESOPHAGOGASTRODUODENOSCOPY (EGD) WITH PROPOFOL [name] 06/03/2015   Procedure: ESOPHAGOGASTRODUODENOSCOPY (EGD) WITH PROPOFOL;  Surgeon: Garlan Fair, MD;  Location: [facility];  Service: Endoscopy;  Laterality: [name];  . HERNIA REPAIR  1970   double  . seed implant for prostate cancer  2006  . TRANSURETHRAL RESECTION OF PROSTATE [name] 11/07/2014   Procedure: TRANSURETHRAL VAPORIZATION OF THE PROSTATE AND BLADDER LESIONS WITH  ERBE (NEW GYRUS) REMOVAL OF BLADDER STONE; PROSTATIC URETHRA BIOPSY;  Surgeon: Alexis Frock, MD;  Location: [facility];  Service: Urology;  Laterality: [name];    Social History   Social History  . Marital status: Married    Spouse name: [name]  . Number of children: [name]  . Years of education: [name]   Occupational History  . Not on file.   Social History Main Topics  . Smoking status: Former Smoker    Packs/day: 1.00    Years: 10.00    Types: Cigarettes    Quit date: 08/31/1968  . Smokeless tobacco: Never Used  . Alcohol use No  . Drug use: No  . Sexual activity: No   Other Topics Concern  . Not on file   Social History Narrative  . No narrative on file   Family History  Problem Relation Age of Onset  . Heart disease Mother       VITAL SIGNS BP 138/70   Pulse 68   Temp 97.6 F (36.4 C)   Resp 20   Ht 5\' 10"  (1.778 m)   Wt 132 lb (59.9 kg)   SpO2 98%   BMI 18.94 kg/m   Patient's Medications  New Prescriptions   No medications on file  Previous Medications   ACETAMINOPHEN (TYLENOL) 500 MG TABLET    Take 500 mg by mouth every 6 (six) hours as needed.   ALPRAZOLAM (XANAX) 0.5 MG TABLET    Take 1  tablet (0.5 mg total) by mouth every 6 (six) hours as needed (for aggitation).   CALCIUM-VITAMIN D-VITAMIN K (VIACTIV) 694-854-62 MG-UNT-MCG CHEW    Chew 1 tablet by mouth 2 (two) times daily.    CITALOPRAM (CELEXA) 20 MG TABLET    Take 20 mg by mouth at bedtime.   COENZYME Q10 (CO Q 10) 100 MG CAPS    Take 1 capsule by mouth daily with breakfast.    CYANOCOBALAMIN 1000 MCG TABLET    Take 1,000 mcg by mouth every morning.    DIVALPROEX (DEPAKOTE SPRINKLE) 125 MG CAPSULE    Take 125 mg by mouth at bedtime.   MEMANTINE (NAMENDA) 10 MG TABLET    Take 10 mg by mouth daily.    MULTIPLE VITAMINS-MINERALS (DECUBI-VITE) CAPS    Take by mouth. Take 1 Capsule by mouth daily for pressure areas   OMEPRAZOLE (PRILOSEC) 20 MG CAPSULE    Take 20 mg by mouth daily before breakfast.     ONDANSETRON 4 MG FILM    Take 4 mg by mouth every 8 (eight) hours as needed.  Modified Medications   No medications on file  Discontinued Medications   ACETAMINOPHEN (TYLENOL) 325 MG TABLET    Take 650 mg by mouth 2 (two) times daily.    AMOXICILLIN-CLAVULANATE (AUGMENTIN) 875-125 MG TABLET    Take 1 tablet by mouth 2 (two) times daily. For 2 more days   HYDROCODONE-ACETAMINOPHEN (NORCO/VICODIN) 5-325 MG TABLET    Take 1-2 tablets by mouth every 4 (four) hours as needed for moderate pain.   RED YEAST RICE 600 MG CAPS    Take 1,200 mg by mouth daily with breakfast.     SIGNIFICANT DIAGNOSTIC EXAMS  10-18-16: ct of head and cervical spine: 1. No evidence of significant acute traumatic injury to the skull, brain or cervical spine. 2. Mild cerebral atrophy with extensive chronic microvascular ischemic changes in the cerebral white matter redemonstrated. 3. Multilevel degenerative disc disease and cervical spondylosis, as above.  11-01-16: chest x-ray: Right lower lobe infiltrate.   LABS REVIEWED:   11-01-16: wbc 12.6; hgb 15.9; hct 48.5; mcv 89.5; plt 437; glucose 122; bun 40; creat 1.28; k+ 3.8; na++ 155; blood culture no growth; urine culture: no growth; HIV: nr; tsh 1.980 11-02-16: wbc 9.2; hgb 15.4; hct 46.5; mcv 89.4; plt 338; glucose 124; bun 32; creat 1.25; k+ 3.4; na++ 154; ast 48; total bili 1.7; albumin 2.8 11-03-16: wbc 7.1; hgb 13.1; hct 41.3; mcv 90.4; plt 288; glucose 99; bun 19; creat 1.18; k+ 3.0; na++ 146 mag 2.0; ammonia 49; vit B 12: 2265 11-04-16: wbc 9.1; hgb 12.7; hct 39.2; mcv 87.3; plt 294; glucose 93; bun 15; creat 0.82; k+ 3.8; na++ 140; ammonia 31 11-10-16: glucose 86; bun 13.3; creat 0.69; k+ 4.6; na++ 141; liver normal albumin 3.0     Review of Systems  Unable to perform ROS: Dementia     Physical Exam  Constitutional: No distress.  Eyes: Conjunctivae are normal.  Neck: Neck supple. No JVD present. No thyromegaly present.  Cardiovascular: Normal rate, regular  rhythm and intact distal pulses.  has murmur  Respiratory: Effort normal and breath sounds normal. No respiratory distress. He has no wheezes.  GI: Soft. Bowel sounds are normal. He exhibits no distension. There is no tenderness.  Musculoskeletal: He exhibits no edema.  Able to move all extremities  Does have stiffness present in extremities   Lymphadenopathy:    He has no cervical adenopathy.  Neurological: He is  alert.  Skin: Skin is warm and dry. He is not diaphoretic.  Psychiatric: He has a normal mood and affect.      ASSESSMENT/ PLAN:  1. Bradycardia: no symptoms present; will monitor   2. Depression with anxiety: is presently on celexa 20 mg daily will stop xanax and will monitor    3. Gerd: has history of balloon dilatation will continue prilosec 20 mg daily  4.  Dementia: 133 pounds: will continue namenda 10 mg  daily  Through 12-10-16 then will be stopped.   5. Protein calorie malnutrition: weight is 132 pounds; albumin 2.8; will continue supplements per facility protocol   6. Chronic pain: no indications of pain present. Will continue tylenol 500 mg every 6 hours as needed and will monitor   7. Vit B deficiency: vit B 12: 2655; will stop supplement at this time and will monitor     MD is aware of resident's narcotic use and is in agreement with current plan of care. We will attempt to wean resident as apropriate     Ok Edwards NP [facility]  Contact [phone] Monday through Friday 8am- 5pm  After hours call [phone]

## 2017-01-06 ENCOUNTER — Encounter: Payer: Self-pay | Admitting: Adult Health

## 2017-01-06 ENCOUNTER — Non-Acute Institutional Stay (SKILLED_NURSING_FACILITY): Payer: Medicare Other | Admitting: Adult Health

## 2017-01-06 DIAGNOSIS — F0150 Vascular dementia without behavioral disturbance: Secondary | ICD-10-CM | POA: Diagnosis not present

## 2017-01-06 DIAGNOSIS — E43 Unspecified severe protein-calorie malnutrition: Secondary | ICD-10-CM | POA: Diagnosis not present

## 2017-01-06 DIAGNOSIS — R001 Bradycardia, unspecified: Secondary | ICD-10-CM | POA: Diagnosis not present

## 2017-01-06 DIAGNOSIS — K219 Gastro-esophageal reflux disease without esophagitis: Secondary | ICD-10-CM

## 2017-01-06 DIAGNOSIS — G8929 Other chronic pain: Secondary | ICD-10-CM | POA: Diagnosis not present

## 2017-01-06 NOTE — Progress Notes (Signed)
Location:   [facility] Room Number: [place] of Service:  SNF (31)   CODE STATUS: DNR  No Known Allergies  Chief Complaint  Patient presents with  . Medical Management of Chronic Issues    1 month follow up    HPI:  He is a long term resident of this facility being seen for the management of his chronic illnesses. Overall there is little change in his status.   Past Medical History:  Diagnosis Date  . Cancer [facility])    Prostate  . Dementia    Alzheimers  . GERD (gastroesophageal reflux disease)   . Heart murmur    systolic murmur no further eval d/t Alzheimers dx  . Hematuria   . Pulmonary nodule 08/17/2014  . Stroke [facility])    TIA    Past Surgical History:  Procedure Laterality Date  . BALLOON DILATION [name] 07/03/2014   Procedure: BALLOON DILATION;  Surgeon: Garlan Fair, MD;  Location[facility];  Service: Endoscopy;  Laterality: [name];  . BALLOON DILATION [name] 06/03/2015   Procedure: BALLOON DILATION;  Surgeon: Garlan Fair, MD;  [facility];  Service: Endoscopy;  Laterality: [name];  . CYSTOSCOPY [name] 11/07/2014   Procedure: CYSTOSCOPY;  Surgeon: Alexis Frock, MD;  Location[facility];  Service: Urology;  Laterality: [name];  . ESOPHAGOGASTRODUODENOSCOPY [name] 07/03/2014   Procedure: ESOPHAGOGASTRODUODENOSCOPY (EGD);  Surgeon: Garlan Fair, MD;  Location: [facility];  Service: Endoscopy;  Laterality: [name];  . ESOPHAGOGASTRODUODENOSCOPY (EGD) WITH PROPOFOL [name] 06/03/2015   Procedure: ESOPHAGOGASTRODUODENOSCOPY (EGD) WITH PROPOFOL;  Surgeon: Garlan Fair, MD;  [facility];  Service: Endoscopy;  Laterality: [name];  . HERNIA REPAIR  1970   double  . seed implant for prostate cancer  2006  . TRANSURETHRAL RESECTION OF PROSTATE [name] 11/07/2014   Procedure: TRANSURETHRAL VAPORIZATION OF THE PROSTATE AND BLADDER LESIONS WITH ERBE (NEW GYRUS) REMOVAL OF BLADDER STONE; PROSTATIC URETHRA BIOPSY;  Surgeon: Alexis Frock, MD;  Location[facility];   Service: Urology;  Laterality: [name];    Social History   Social History  . Marital status: Married    Spouse name: [name]  . Number of children: [name]  . Years of education: [name]   Occupational History  . Not on file.   Social History Main Topics  . Smoking status: Former Smoker    Packs/day: 1.00    Years: 10.00    Types: Cigarettes    Quit date: 08/31/1968  . Smokeless tobacco: Never Used  . Alcohol use No  . Drug use: No  . Sexual activity: No   Other Topics Concern  . Not on file   Social History Narrative  . No narrative on file   Family History  Problem Relation Age of Onset  . Heart disease Mother       VITAL SIGNS BP 100/62   Pulse 100   Temp 97.2 F (36.2 C)   Resp (!) 22   Ht 5\' 10"  (1.778 m)   Wt 132 lb (59.9 kg)   SpO2 94%   BMI 18.94 kg/m   Patient's Medications  New Prescriptions   No medications on file  Previous Medications   ACETAMINOPHEN (TYLENOL) 500 MG TABLET    Take 500 mg by mouth every 6 (six) hours as needed.   CALCIUM-VITAMIN D-VITAMIN K (VIACTIV) 683-419-62 MG-UNT-MCG CHEW    Chew 1 tablet by mouth 2 (two) times daily.    CITALOPRAM (CELEXA) 20 MG TABLET    Take 20 mg  by mouth at bedtime.   COENZYME Q10 (CO Q 10) 100 MG CAPS    Take 1 capsule by mouth daily with breakfast.    DIVALPROEX (DEPAKOTE SPRINKLE) 125 MG CAPSULE    Take 125 mg by mouth at bedtime.   MEMANTINE (NAMENDA) 10 MG TABLET    Take 10 mg by mouth daily.    MULTIPLE VITAMINS-MINERALS (DECUBI-VITE) CAPS    Take by mouth. Take 1 Capsule by mouth daily for pressure areas   OMEPRAZOLE (PRILOSEC) 20 MG CAPSULE    Take 20 mg by mouth daily before breakfast.    ONDANSETRON 4 MG FILM    Take 4 mg by mouth every 8 (eight) hours as needed.  Modified Medications   No medications on file  Discontinued Medications   ALPRAZOLAM (XANAX) 0.5 MG TABLET    Take 1 tablet (0.5 mg total) by mouth every 6 (six) hours as needed (for aggitation).   CYANOCOBALAMIN 1000 MCG TABLET    Take  1,000 mcg by mouth every morning.      SIGNIFICANT DIAGNOSTIC EXAMS  10-18-16: ct of head and cervical spine: 1. No evidence of significant acute traumatic injury to the skull, brain or cervical spine. 2. Mild cerebral atrophy with extensive chronic microvascular ischemic changes in the cerebral white matter redemonstrated. 3. Multilevel degenerative disc disease and cervical spondylosis, as above.  11-01-16: chest x-ray: Right lower lobe infiltrate.   LABS REVIEWED:   11-01-16: wbc 12.6; hgb 15.9; hct 48.5; mcv 89.5; plt 437; glucose 122; bun 40; creat 1.28; k+ 3.8; na++ 155; blood culture no growth; urine culture: no growth; HIV: nr; tsh 1.980 11-02-16: wbc 9.2; hgb 15.4; hct 46.5; mcv 89.4; plt 338; glucose 124; bun 32; creat 1.25; k+ 3.4; na++ 154; ast 48; total bili 1.7; albumin 2.8 11-03-16: wbc 7.1; hgb 13.1; hct 41.3; mcv 90.4; plt 288; glucose 99; bun 19; creat 1.18; k+ 3.0; na++ 146 mag 2.0; ammonia 49; vit B 12: 2265 11-04-16: wbc 9.1; hgb 12.7; hct 39.2; mcv 87.3; plt 294; glucose 93; bun 15; creat 0.82; k+ 3.8; na++ 140; ammonia 31 11-10-16: glucose 86; bun 13.3; creat 0.69; k+ 4.6; na++ 141; liver normal albumin 3.0     Review of Systems  Unable to perform ROS: Dementia     Physical Exam  Constitutional: No distress.  Eyes: Conjunctivae are normal.  Neck: Neck supple. No JVD present. No thyromegaly present.  Cardiovascular: Normal rate, regular rhythm and intact distal pulses.  has murmur  Respiratory: Effort normal and breath sounds normal. No respiratory distress. He has no wheezes.  GI: Soft. Bowel sounds are normal. He exhibits no distension. There is no tenderness.  Musculoskeletal: He exhibits no edema.  Able to move all extremities  Does have stiffness present in extremities   Lymphadenopathy:    He has no cervical adenopathy.  Neurological: He is alert.  Skin: Skin is warm and dry. He is not diaphoretic.  Psychiatric: He has a normal mood and affect.       ASSESSMENT/ PLAN:  1. Bradycardia: no symptoms present; will monitor   2. Depression with anxiety: is presently on celexa 20 mg daily   3. Gerd: has history of balloon dilatation will continue prilosec 20 mg daily  4.  Dementia: 130 pounds: will continue namenda 10 mg  daily  .   5. Protein calorie malnutrition: weight is 130 pounds; albumin 2.8; will continue supplements per facility protocol   6. Chronic pain: no indications of pain present. Will continue  tylenol 500 mg every 6 hours as needed and will monitor   7. Vit B deficiency: vit B 12: 2655; will monitor      Ok Edwards NP [facility] Adult Medicine  Contact [phone] Monday through Friday 8am- 5pm  After hours call [phone]

## 2017-01-18 DIAGNOSIS — J189 Pneumonia, unspecified organism: Secondary | ICD-10-CM | POA: Diagnosis not present

## 2017-01-19 DIAGNOSIS — J189 Pneumonia, unspecified organism: Secondary | ICD-10-CM | POA: Diagnosis not present

## 2017-01-20 DIAGNOSIS — J189 Pneumonia, unspecified organism: Secondary | ICD-10-CM | POA: Diagnosis not present

## 2017-01-21 DIAGNOSIS — J189 Pneumonia, unspecified organism: Secondary | ICD-10-CM | POA: Diagnosis not present

## 2017-01-22 DIAGNOSIS — J189 Pneumonia, unspecified organism: Secondary | ICD-10-CM | POA: Diagnosis not present

## 2017-01-25 DIAGNOSIS — J189 Pneumonia, unspecified organism: Secondary | ICD-10-CM | POA: Diagnosis not present

## 2017-01-27 DIAGNOSIS — J189 Pneumonia, unspecified organism: Secondary | ICD-10-CM | POA: Diagnosis not present

## 2017-01-28 DIAGNOSIS — J189 Pneumonia, unspecified organism: Secondary | ICD-10-CM | POA: Diagnosis not present

## 2017-01-29 DIAGNOSIS — J189 Pneumonia, unspecified organism: Secondary | ICD-10-CM | POA: Diagnosis not present

## 2017-01-30 DIAGNOSIS — J189 Pneumonia, unspecified organism: Secondary | ICD-10-CM | POA: Diagnosis not present

## 2017-02-01 DIAGNOSIS — J189 Pneumonia, unspecified organism: Secondary | ICD-10-CM | POA: Diagnosis not present

## 2017-02-02 DIAGNOSIS — J189 Pneumonia, unspecified organism: Secondary | ICD-10-CM | POA: Diagnosis not present

## 2017-02-03 ENCOUNTER — Non-Acute Institutional Stay (SKILLED_NURSING_FACILITY): Payer: Medicare Other | Admitting: Adult Health

## 2017-02-03 ENCOUNTER — Encounter: Payer: Self-pay | Admitting: Adult Health

## 2017-02-03 DIAGNOSIS — E43 Unspecified severe protein-calorie malnutrition: Secondary | ICD-10-CM

## 2017-02-03 DIAGNOSIS — J189 Pneumonia, unspecified organism: Secondary | ICD-10-CM | POA: Diagnosis not present

## 2017-02-03 DIAGNOSIS — G8929 Other chronic pain: Secondary | ICD-10-CM | POA: Diagnosis not present

## 2017-02-03 DIAGNOSIS — F0150 Vascular dementia without behavioral disturbance: Secondary | ICD-10-CM | POA: Diagnosis not present

## 2017-02-03 DIAGNOSIS — K219 Gastro-esophageal reflux disease without esophagitis: Secondary | ICD-10-CM

## 2017-02-03 DIAGNOSIS — R001 Bradycardia, unspecified: Secondary | ICD-10-CM

## 2017-02-03 NOTE — Progress Notes (Signed)
Location:   [place] Room Number: [place] of Service:  SNF (31)   CODE STATUS: DNR  No Known Allergies  Chief Complaint  Patient presents with  . Medical Management of Chronic Issues    1 month follow up, S/P Fall, Behaviors    HPI:  He is a long term resident of this facility being seen for the management of his chronic illnesses. He has had a fall without injury. Staff reports that he is aggressive with staff; including throwing glasses of water; hitting etc. His behaviors are worse in the evening hours.  He is unable to participate in the hpi or ros.   Past Medical History:  Diagnosis Date  . Cancer [facility])    Prostate  . Dementia    Alzheimers  . GERD (gastroesophageal reflux disease)   . Heart murmur    systolic murmur no further eval d/t Alzheimers dx  . Hematuria   . Pulmonary nodule 08/17/2014  . Stroke [facility])    TIA    Past Surgical History:  Procedure Laterality Date  . BALLOON DILATION [name] 07/03/2014   Procedure: BALLOON DILATION;  Surgeon: Garlan Fair, MD;  [facility];  Service: Endoscopy;  Laterality: [name];  . BALLOON DILATION [name] 06/03/2015   Procedure: BALLOON DILATION;  Surgeon: Garlan Fair, MD;  [facility];  Service: Endoscopy;  Laterality: [name];  . CYSTOSCOPY [name] 11/07/2014   Procedure: CYSTOSCOPY;  Surgeon: Alexis Frock, MD;  [facility];  Service: Urology;  Laterality: [name];  . ESOPHAGOGASTRODUODENOSCOPY [name] 07/03/2014   Procedure: ESOPHAGOGASTRODUODENOSCOPY (EGD);  Surgeon: Garlan Fair, MD;  Location: [facility];  Service: Endoscopy;  Laterality: [name];  . ESOPHAGOGASTRODUODENOSCOPY (EGD) WITH PROPOFOL [name] 06/03/2015   Procedure: ESOPHAGOGASTRODUODENOSCOPY (EGD) WITH PROPOFOL;  Surgeon: Garlan Fair, MD;  [facility];  Service: Endoscopy;  Laterality: [name];  . HERNIA REPAIR  1970   double  . seed implant for prostate cancer  2006  . TRANSURETHRAL RESECTION OF PROSTATE [name] 11/07/2014    Procedure: TRANSURETHRAL VAPORIZATION OF THE PROSTATE AND BLADDER LESIONS WITH ERBE (NEW GYRUS) REMOVAL OF BLADDER STONE; PROSTATIC URETHRA BIOPSY;  Surgeon: Alexis Frock, MD;  [facility];  Service: Urology;  Laterality: [name];    Social History   Social History  . Marital status: Married    Spouse name: [name]  . Number of children: [name]  . Years of education: [name]   Occupational History  . Not on file.   Social History Main Topics  . Smoking status: Former Smoker    Packs/day: 1.00    Years: 10.00    Types: Cigarettes    Quit date: 08/31/1968  . Smokeless tobacco: Never Used  . Alcohol use No  . Drug use: No  . Sexual activity: No   Other Topics Concern  . Not on file   Social History Narrative  . No narrative on file   Family History  Problem Relation Age of Onset  . Heart disease Mother   .  VITAL SIGNS BP 124/82   Pulse 86   Temp 98.6 F (37 C)   Resp 18   Ht 5\' 10"  (1.778 m)   Wt 132 lb (59.9 kg)   SpO2 98%   BMI 18.94 kg/m   Patient's Medications  New Prescriptions   No medications on file  Previous Medications   ACETAMINOPHEN (TYLENOL) 500 MG TABLET    Take 500 mg by mouth every 6 (six) hours as needed.  AMINO ACIDS-PROTEIN HYDROLYS (FEEDING SUPPLEMENT, PRO-STAT SUGAR FREE 64,) LIQD    Take 30 mLs by mouth 3 (three) times daily with meals.   ASPIRIN EC 81 MG TABLET    Take 81 mg by mouth daily.   CALCIUM-VITAMIN D (OSCAL WITH D) 500-200 MG-UNIT TABLET    Take 1 tablet by mouth 2 (two) times daily.   CITALOPRAM (CELEXA) 20 MG TABLET    Take 20 mg by mouth at bedtime.   COENZYME Q10 (CO Q 10) 100 MG CAPS    Take 1 capsule by mouth daily with breakfast.    DIVALPROEX (DEPAKOTE SPRINKLE) 125 MG CAPSULE    Take 125 mg by mouth at bedtime.   MULTIPLE VITAMINS-MINERALS (DECUBI-VITE) CAPS    Take by mouth. Take 1 Capsule by mouth daily for pressure areas   OMEPRAZOLE (PRILOSEC) 20 MG CAPSULE    Take 20 mg by mouth daily before breakfast.    ONDANSETRON  4 MG FILM    Take 4 mg by mouth every 8 (eight) hours as needed.  Modified Medications   No medications on file  Discontinued Medications   CALCIUM-VITAMIN D-VITAMIN K (VIACTIV) 500-500-40 MG-UNT-MCG CHEW    Chew 1 tablet by mouth 2 (two) times daily.    MEMANTINE (NAMENDA) 10 MG TABLET    Take 10 mg by mouth daily.      SIGNIFICANT DIAGNOSTIC EXAMS  10-18-16: ct of head and cervical spine: 1. No evidence of significant acute traumatic injury to the skull, brain or cervical spine. 2. Mild cerebral atrophy with extensive chronic microvascular ischemic changes in the cerebral white matter redemonstrated. 3. Multilevel degenerative disc disease and cervical spondylosis, as above.  11-01-16: chest x-ray: Right lower lobe infiltrate.   LABS REVIEWED:   11-01-16: wbc 12.6; hgb 15.9; hct 48.5; mcv 89.5; plt 437; glucose 122; bun 40; creat 1.28; k+ 3.8; na++ 155; blood culture no growth; urine culture: no growth; HIV: nr; tsh 1.980 11-02-16: wbc 9.2; hgb 15.4; hct 46.5; mcv 89.4; plt 338; glucose 124; bun 32; creat 1.25; k+ 3.4; na++ 154; ast 48; total bili 1.7; albumin 2.8 11-03-16: wbc 7.1; hgb 13.1; hct 41.3; mcv 90.4; plt 288; glucose 99; bun 19; creat 1.18; k+ 3.0; na++ 146 mag 2.0; ammonia 49; vit B 12: 2265 11-04-16: wbc 9.1; hgb 12.7; hct 39.2; mcv 87.3; plt 294; glucose 93; bun 15; creat 0.82; k+ 3.8; na++ 140; ammonia 31 11-10-16: glucose 86; bun 13.3; creat 0.69; k+ 4.6; na++ 141; liver normal albumin 3.0     Review of Systems  Unable to perform ROS: Dementia     Physical Exam  Constitutional: No distress.  Eyes: Conjunctivae are normal.  Neck: Neck supple. No JVD present. No thyromegaly present.  Cardiovascular: Normal rate, regular rhythm and intact distal pulses.  has murmur  Bilateral carotid bruit  Respiratory: Effort normal and breath sounds normal. No respiratory distress. He has no wheezes.  GI: Soft. Bowel sounds are normal. He exhibits no distension. There is no tenderness.    Musculoskeletal: He exhibits no edema.  Able to move all extremities  Does have stiffness present in extremities   Lymphadenopathy:    He has no cervical adenopathy.  Neurological: He is alert.  Skin: Skin is warm and dry. He is not diaphoretic.  Psychiatric: He has a normal mood and affect.      ASSESSMENT/ PLAN:  1. Bradycardia: no symptoms present; will monitor   2. Depression with anxiety: is presently on celexa 20 mg daily  Will increase depakote to 125 mg at 2 PM and HS. Will begin xanax 0.25 mg every 8 hours as needed for 14 days and will monitor his status.   3. Jerrye Bushy: has history of balloon dilatation will continue prilosec 20 mg daily  4.  Dementia: his weight is  132  pounds: will continue namenda 10 mg  daily  .   5. Protein calorie malnutrition: weight is 132  pounds; albumin 3.0; will reduce prostat to 30 cc prior to supper   6. Chronic pain: no indications of pain present. Will continue tylenol 500 mg every 6 hours as needed and will monitor   7. Vit B deficiency: vit B 12: 2655; will monitor    MD is aware of resident's narcotic use and is in agreement with current plan of care. We will attempt to wean resident as apropriate     Ok Edwards NP [facility]  Contact [phone] Monday through Friday 8am- 5pm  After hours call [phone]

## 2017-02-04 DIAGNOSIS — J189 Pneumonia, unspecified organism: Secondary | ICD-10-CM | POA: Diagnosis not present

## 2017-02-08 DIAGNOSIS — J189 Pneumonia, unspecified organism: Secondary | ICD-10-CM | POA: Diagnosis not present

## 2017-02-10 ENCOUNTER — Non-Acute Institutional Stay (SKILLED_NURSING_FACILITY): Payer: Medicare Other | Admitting: Adult Health

## 2017-02-10 ENCOUNTER — Encounter: Payer: Self-pay | Admitting: Adult Health

## 2017-02-10 DIAGNOSIS — E43 Unspecified severe protein-calorie malnutrition: Secondary | ICD-10-CM

## 2017-02-10 DIAGNOSIS — F0150 Vascular dementia without behavioral disturbance: Secondary | ICD-10-CM

## 2017-02-10 DIAGNOSIS — F418 Other specified anxiety disorders: Secondary | ICD-10-CM

## 2017-02-10 DIAGNOSIS — J189 Pneumonia, unspecified organism: Secondary | ICD-10-CM | POA: Diagnosis not present

## 2017-02-10 NOTE — Progress Notes (Signed)
Location:   [place] Room Number: [place] of Service:  SNF (31)   CODE STATUS: DNR  No Known Allergies  Chief Complaint  Patient presents with  . Acute Visit    Increased Behavior    HPI:  Staff report that he is having increasing behaviors; especially during nursing care. He is having difficulty participating with therapy. This morning he is laying in bed in a fetal position and is resistant to movement. He is unable to participate in the hpi or ros.    Past Medical History:  Diagnosis Date  . Cancer [facility])    Prostate  . Dementia    Alzheimers  . GERD (gastroesophageal reflux disease)   . Heart murmur    systolic murmur no further eval d/t Alzheimers dx  . Hematuria   . Pulmonary nodule 08/17/2014  . Stroke [facility])    TIA    Past Surgical History:  Procedure Laterality Date  . BALLOON DILATION [name] 07/03/2014   Procedure: BALLOON DILATION;  Surgeon: Garlan Fair, MD;  Location: [facility];  Service: Endoscopy;  Laterality: [name];  . BALLOON DILATION [name] 06/03/2015   Procedure: BALLOON DILATION;  Surgeon: Garlan Fair, MD;  [facility];  Service: Endoscopy;  Laterality: [name];  . CYSTOSCOPY [name] 11/07/2014   Procedure: CYSTOSCOPY;  Surgeon: Alexis Frock, MD;  [facility];  Service: Urology;  Laterality: [name];  . ESOPHAGOGASTRODUODENOSCOPY [name] 07/03/2014   Procedure: ESOPHAGOGASTRODUODENOSCOPY (EGD);  Surgeon: Garlan Fair, MD;  Location: [facility];  Service: Endoscopy;  Laterality: [name];  . ESOPHAGOGASTRODUODENOSCOPY (EGD) WITH PROPOFOL [name] 06/03/2015   Procedure: ESOPHAGOGASTRODUODENOSCOPY (EGD) WITH PROPOFOL;  Surgeon: Garlan Fair, MD;  [facility];  Service: Endoscopy;  Laterality: [name];  . HERNIA REPAIR  1970   double  . seed implant for prostate cancer  2006  . TRANSURETHRAL RESECTION OF PROSTATE [name] 11/07/2014   Procedure: TRANSURETHRAL VAPORIZATION OF THE PROSTATE AND BLADDER LESIONS WITH ERBE (NEW GYRUS)  REMOVAL OF BLADDER STONE; PROSTATIC URETHRA BIOPSY;  Surgeon: Alexis Frock, MD;  [facility];  Service: Urology;  Laterality: [name];    Social History   Social History  . Marital status: Married    Spouse name: [name]  . Number of children: [name]  . Years of education: [name]   Occupational History  . Not on file.   Social History Main Topics  . Smoking status: Former Smoker    Packs/day: 1.00    Years: 10.00    Types: Cigarettes    Quit date: 08/31/1968  . Smokeless tobacco: Never Used  . Alcohol use No  . Drug use: No  . Sexual activity: No   Other Topics Concern  . Not on file   Social History Narrative  . No narrative on file   Family History  Problem Relation Age of Onset  . Heart disease Mother       VITAL SIGNS BP (!) 144/88   Pulse 95   Temp (!) 96.6 F (35.9 C)   Resp 18   Ht 5\' 6"  (1.676 m)   Wt 128 lb 6.4 oz (58.2 kg)   SpO2 97%   BMI 20.72 kg/m   Patient's Medications  New Prescriptions   No medications on file  Previous Medications   ACETAMINOPHEN (TYLENOL) 500 MG TABLET    Take 500 mg by mouth every 6 (six) hours as needed.   ALPRAZOLAM (XANAX) 0.25 MG TABLET    Take 0.25 mg by mouth every 8 (  eight) hours as needed for anxiety.   AMINO ACIDS-PROTEIN HYDROLYS (FEEDING SUPPLEMENT, PRO-STAT SUGAR FREE 64,) LIQD    Take 30 mLs by mouth 3 (three) times daily with meals.   ASPIRIN EC 81 MG TABLET    Take 81 mg by mouth daily.   CALCIUM-VITAMIN D (OSCAL WITH D) 500-200 MG-UNIT TABLET    Take 1 tablet by mouth 2 (two) times daily.   CITALOPRAM (CELEXA) 20 MG TABLET    Take 20 mg by mouth at bedtime.   COENZYME Q10 (CO Q 10) 100 MG CAPS    Take 1 capsule by mouth daily with breakfast.    DIVALPROEX (DEPAKOTE SPRINKLE) 125 MG CAPSULE    Take 125 mg by mouth 2 (two) times daily.    MULTIPLE VITAMINS-MINERALS (DECUBI-VITE) CAPS    Take by mouth. Take 1 Capsule by mouth daily for pressure areas   OMEPRAZOLE (PRILOSEC) 20 MG CAPSULE    Take 20 mg by mouth  daily before breakfast.    ONDANSETRON 4 MG FILM    Take 4 mg by mouth every 8 (eight) hours as needed.  Modified Medications   No medications on file  Discontinued Medications   No medications on file     SIGNIFICANT DIAGNOSTIC EXAMS  10-18-16: ct of head and cervical spine: 1. No evidence of significant acute traumatic injury to the skull, brain or cervical spine. 2. Mild cerebral atrophy with extensive chronic microvascular ischemic changes in the cerebral white matter redemonstrated. 3. Multilevel degenerative disc disease and cervical spondylosis, as above.  11-01-16: chest x-ray: Right lower lobe infiltrate.   LABS REVIEWED:   11-01-16: wbc 12.6; hgb 15.9; hct 48.5; mcv 89.5; plt 437; glucose 122; bun 40; creat 1.28; k+ 3.8; na++ 155; blood culture no growth; urine culture: no growth; HIV: nr; tsh 1.980 11-02-16: wbc 9.2; hgb 15.4; hct 46.5; mcv 89.4; plt 338; glucose 124; bun 32; creat 1.25; k+ 3.4; na++ 154; ast 48; total bili 1.7; albumin 2.8 11-03-16: wbc 7.1; hgb 13.1; hct 41.3; mcv 90.4; plt 288; glucose 99; bun 19; creat 1.18; k+ 3.0; na++ 146 mag 2.0; ammonia 49; vit B 12: 2265 11-04-16: wbc 9.1; hgb 12.7; hct 39.2; mcv 87.3; plt 294; glucose 93; bun 15; creat 0.82; k+ 3.8; na++ 140; ammonia 31 11-10-16: glucose 86; bun 13.3; creat 0.69; k+ 4.6; na++ 141; liver normal albumin 3.0     Review of Systems  Unable to perform ROS: Dementia     Physical Exam  Constitutional: No distress.  Eyes: Conjunctivae are normal.  Neck: Neck supple. No JVD present. No thyromegaly present.  Cardiovascular: Normal rate, regular rhythm and intact distal pulses.  has murmur  Bilateral carotid bruit  Respiratory: Effort normal and breath sounds normal. No respiratory distress. He has no wheezes.  GI: Soft. Bowel sounds are normal. He exhibits no distension. There is no tenderness.  Musculoskeletal: He exhibits no edema.  Does have stiffness present in extremities   Lymphadenopathy:    He has  no cervical adenopathy.  Neurological: He is aware Skin: Skin is warm and dry. He is not diaphoretic.  Psychiatric: resistant to care.      ASSESSMENT/ PLAN:  1. Depression with anxiety: is presently on celexa 20 mg daily   Will contrinue depakote to 125 mg at 2 PM and HS. Will change his xanax to 0.25 mg twice daily .   2.  Dementia: his weight is  128  pounds: will continue namenda 10 mg  daily  .  3. Protein calorie malnutrition: weight is 128  pounds; albumin 3.0;  30 cc prior to supper   The goal of using xanax on a routine basis is help him with anxiety associated with nursing care; allowing for easier care of his adl's.    MD is aware of resident's narcotic use and is in agreement with current plan of care. We will attempt to wean resident as apropriate     Ok Edwards NP [facility]  Contact [phone] Monday through Friday 8am- 5pm  After hours call [phone]

## 2017-02-11 DIAGNOSIS — J189 Pneumonia, unspecified organism: Secondary | ICD-10-CM | POA: Diagnosis not present

## 2017-02-12 DIAGNOSIS — J189 Pneumonia, unspecified organism: Secondary | ICD-10-CM | POA: Diagnosis not present

## 2017-02-15 DIAGNOSIS — J189 Pneumonia, unspecified organism: Secondary | ICD-10-CM | POA: Diagnosis not present

## 2017-02-16 DIAGNOSIS — J189 Pneumonia, unspecified organism: Secondary | ICD-10-CM | POA: Diagnosis not present

## 2017-02-17 DIAGNOSIS — J189 Pneumonia, unspecified organism: Secondary | ICD-10-CM | POA: Diagnosis not present

## 2017-02-18 DIAGNOSIS — J189 Pneumonia, unspecified organism: Secondary | ICD-10-CM | POA: Diagnosis not present

## 2017-02-19 DIAGNOSIS — J189 Pneumonia, unspecified organism: Secondary | ICD-10-CM | POA: Diagnosis not present

## 2017-02-22 DIAGNOSIS — J189 Pneumonia, unspecified organism: Secondary | ICD-10-CM | POA: Diagnosis not present

## 2017-02-23 IMAGING — CR DG HIP (WITH OR WITHOUT PELVIS) 3-4V BILAT
5 series · 5 of 5 positions shown · non-contrast
Comparison: CT 08/28/2014.

CLINICAL DATA: Fall.

EXAM:
DG HIP (WITH OR WITHOUT PELVIS) 3-4V BILAT

[x pelvis]
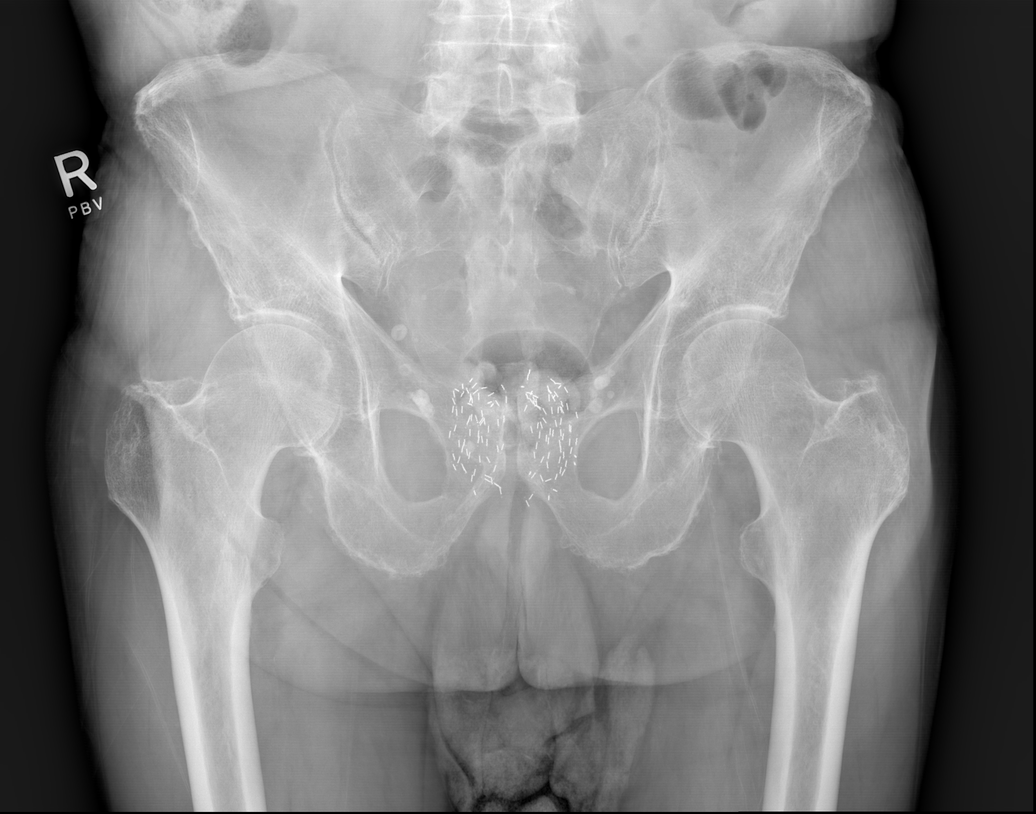

[x hip ap left]
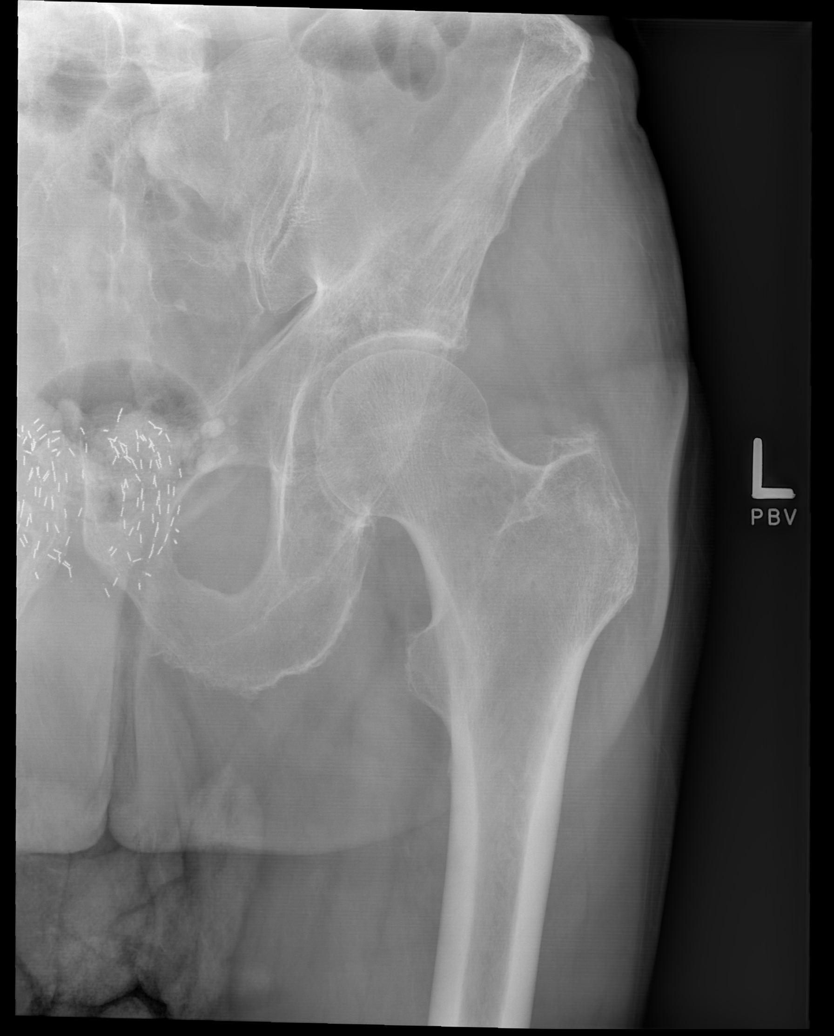

[x hip ap right]
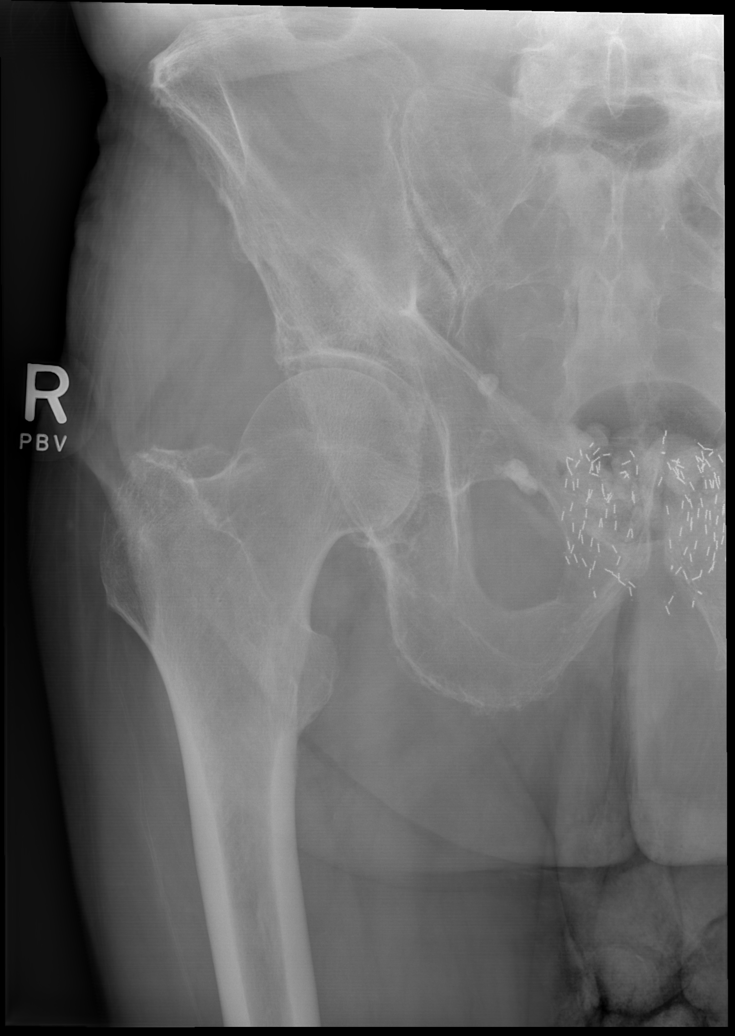

[x hip lat right]
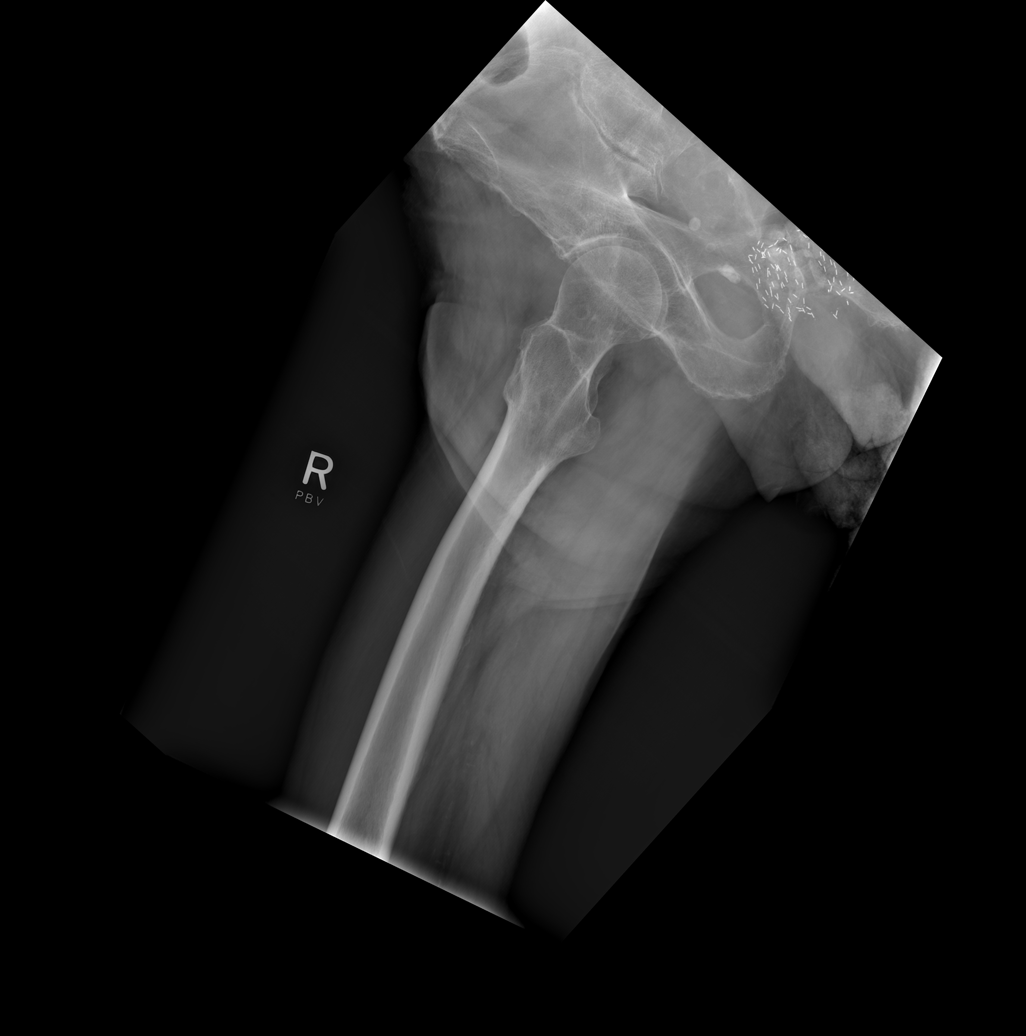

[x hip lat left]
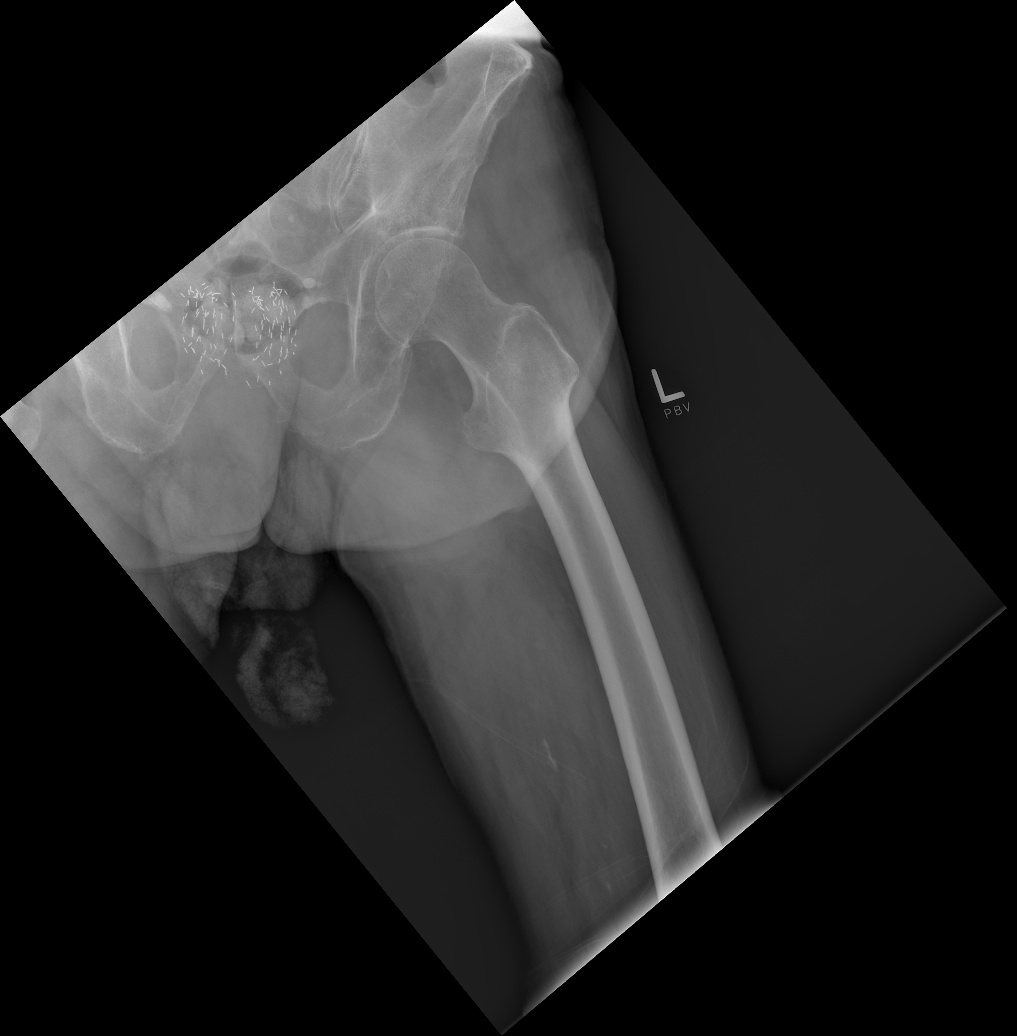

[5 of 5 positions shown; findings below may reference images not displayed]

FINDINGS: Radiation seed implants noted in the pelvis. Pelvic calcifications
consistent phleboliths. No acute bony or joint abnormality
identified.
IMPRESSION: No acute abnormality identified.

## 2017-02-23 IMAGING — CT CT CERVICAL SPINE W/O CM
4 of 8 series · 13 of 33 positions shown, 14 images · non-contrast
Comparison: 06/17/2016

CLINICAL DATA: History of fall, complaining of head pain and hip
pain. History of dementia

EXAM:
CT HEAD WITHOUT CONTRAST
CT CERVICAL SPINE WITHOUT CONTRAST
TECHNIQUE: Multidetector CT imaging of the head and cervical spine was
performed following the standard protocol without intravenous
contrast. Multiplanar CT image reconstructions of the cervical spine
were also generated.

[Series 8: c-spine st · axial · 0.44mm/px · z∈[+1528,+1588]mm · 2 of 90 slices shown]
[im 30/90  bone]
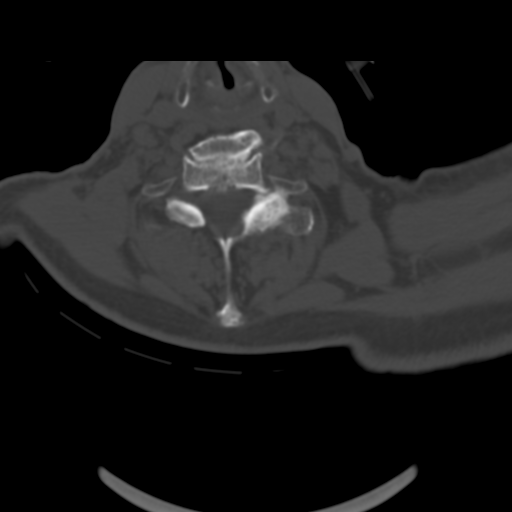
[im 60/90  bone]
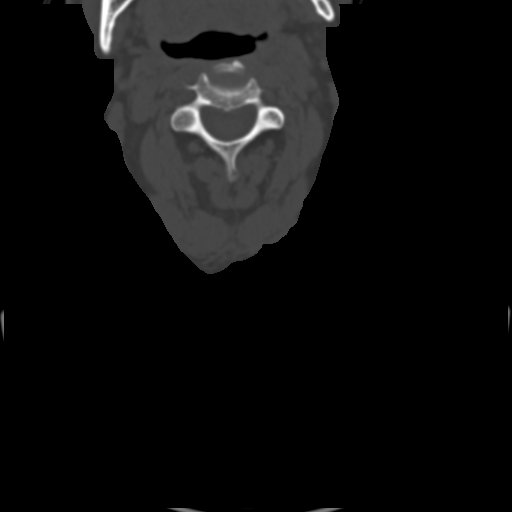

[Series 11: axial recon · axial · 0.30mm/px · z∈[+1448,+1576]mm · 4 of 119 slices shown, 5 images]
[im 24/119  soft-tissue]
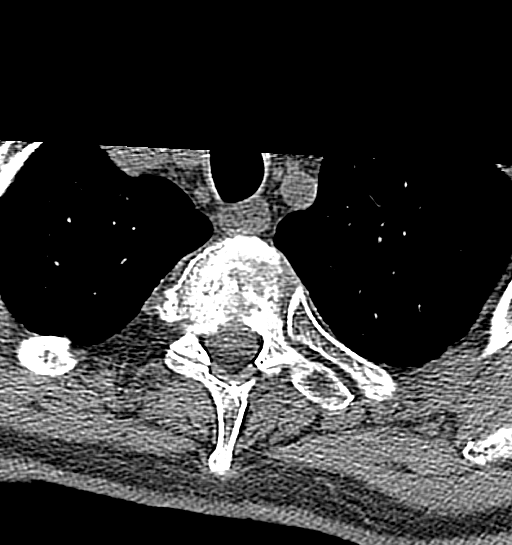
[im 24/119  bone]
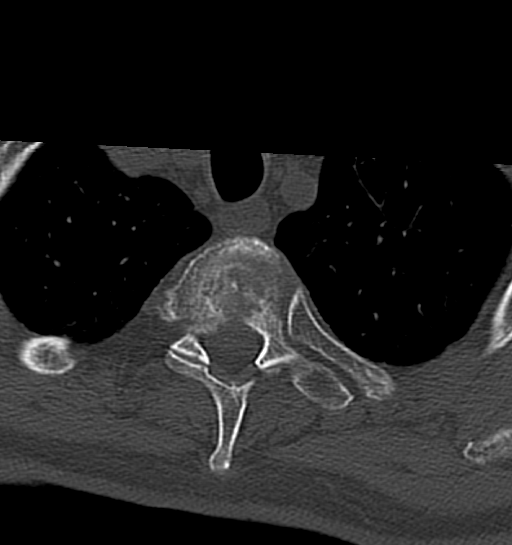
[im 48/119  bone]
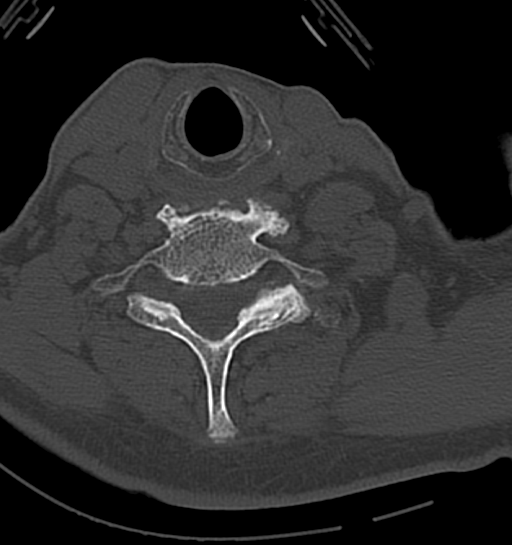
[im 71/119  bone]
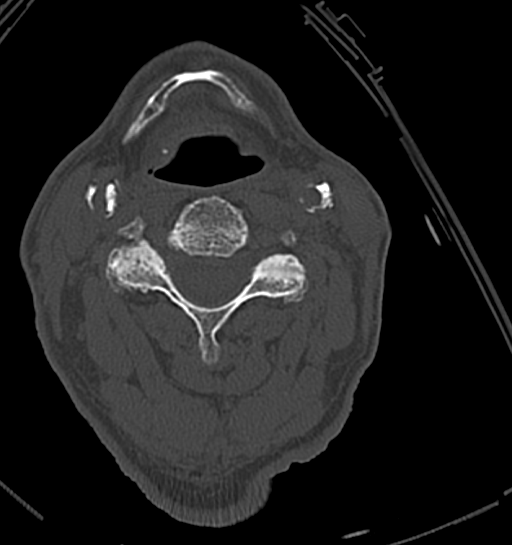
[im 95/119  bone]
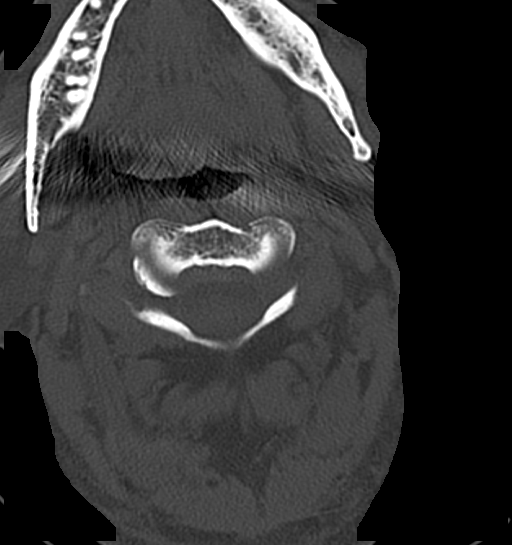

[Series 12: coronal · coronal · 0.33mm/px · 2 of 98 slices shown]
[im 33/98  bone]
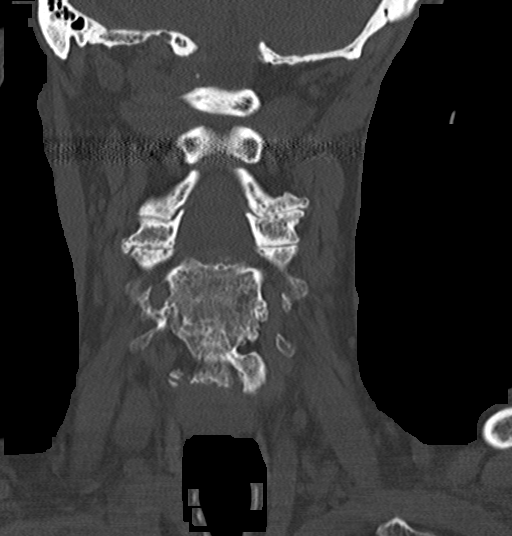
[im 65/98  bone]
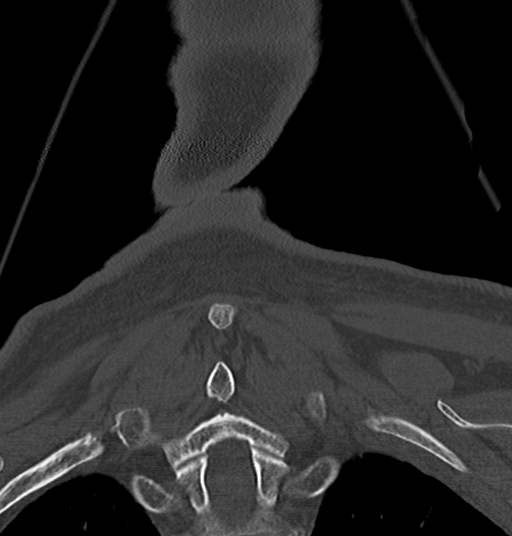

[Series 13: sagittal · sagittal · 0.35mm/px · 5 of 69 slices shown]
[im 12/69  bone]
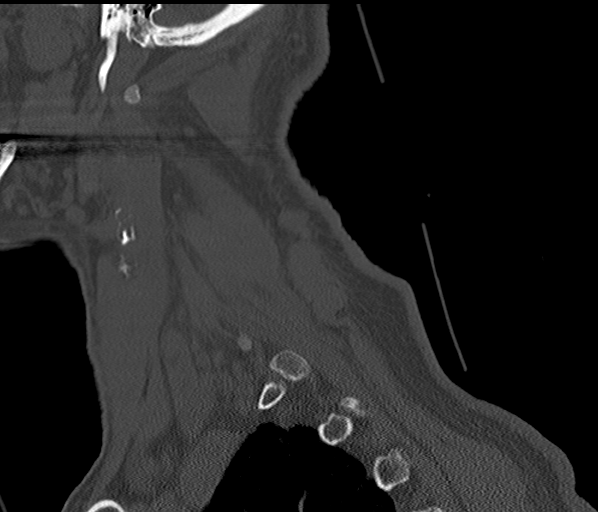
[im 23/69  bone]
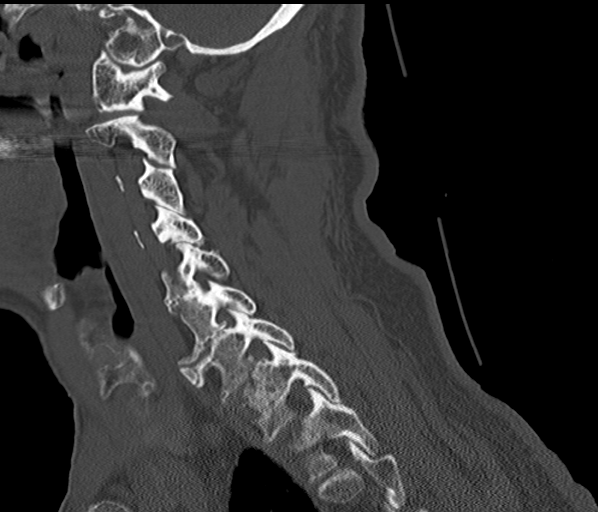
[im 35/69  bone]
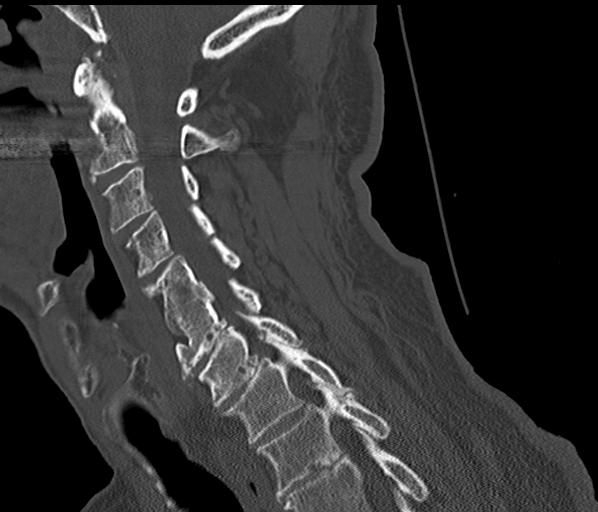
[im 46/69  bone]
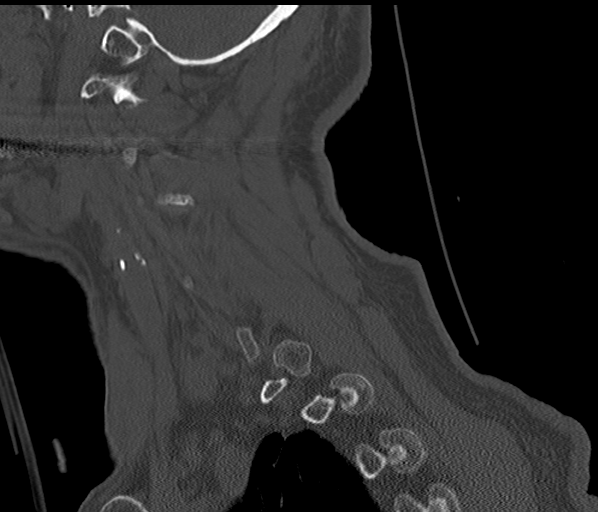
[im 57/69  bone]
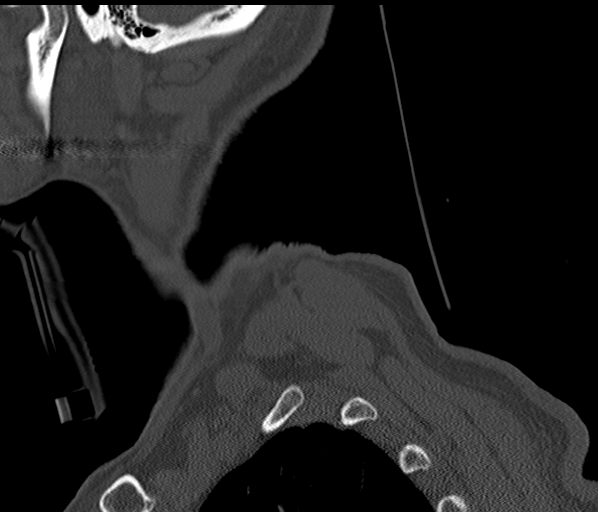

[13 of 33 positions shown; findings below may reference images not displayed]

FINDINGS: CT HEAD FINDINGS

Brain: No acute territorial infarction, intracranial hemorrhage, or
focal mass lesion is visualized. There are no extra-axial fluid
collections. Moderate global atrophy. Ventricles are slightly
enlarged but similar compared to prior, and felt related to atrophy.
Moderate periventricular and subcortical white matter hypodensities
consistent with small vessel disease.

Vascular: Carotid artery calcifications.  No hyperdense vessels.

Skull: Mastoid air cells clear.  No skull fracture is seen.

Sinuses/Orbits: Mild mucosal thickening in the ethmoid sinuses. No
acute orbital abnormality.

Other: None

CT CERVICAL SPINE FINDINGS

Alignment: Cervical alignment is similar compared to prior study.
Minimal anterior listhesis of C4 on C5 and C7 on T1. Facet alignment
is maintained.

Skull base and vertebrae: Craniovertebral junction appears intact.
Vertebral body heights grossly maintained. Bony fusion at C5-C6
again visualized. Probable cyst within the C2 vertebral body
unchanged.

Soft tissues and spinal canal: No prevertebral fluid or swelling. No
visible canal hematoma.

Disc levels: Mild narrowing at C4-C5, moderate severe narrowing at
C6-C7 and C7-T1 with endplate changes and osteophytes. Mild canal
stenosis at C5 and C6. Multilevel hyper trophic facet arthropathy
bilaterally results in bilateral multilevel foraminal stenosis.

Upper chest: Lung apices clear. Thyroid gland stable. Carotid artery
calcifications.

Other: None
IMPRESSION: 1. No CT evidence for acute intracranial abnormality. Moderate
periventricular, subcortical, and deep white matter hypodensities,
felt consistent with small vessel changes.
2. Multilevel degenerative disc changes of the cervical spine. No
acute fracture or malalignment.

## 2017-02-24 DIAGNOSIS — D649 Anemia, unspecified: Secondary | ICD-10-CM | POA: Diagnosis not present

## 2017-02-24 DIAGNOSIS — Z79899 Other long term (current) drug therapy: Secondary | ICD-10-CM | POA: Diagnosis not present

## 2017-02-24 DIAGNOSIS — J189 Pneumonia, unspecified organism: Secondary | ICD-10-CM | POA: Diagnosis not present

## 2017-02-24 LAB — BASIC METABOLIC PANEL
BUN: 14 (ref 4–21)
CREATININE: 0.7 (ref 0.6–1.3)
GLUCOSE: 90
POTASSIUM: 4.4 (ref 3.4–5.3)
Sodium: 140 (ref 137–147)

## 2017-02-24 LAB — HEPATIC FUNCTION PANEL
ALT: 10 (ref 10–40)
AST: 17 (ref 14–40)
Alkaline Phosphatase: 81 (ref 25–125)
Bilirubin, Total: 0.3

## 2017-02-24 LAB — CBC AND DIFFERENTIAL
HCT: 41 (ref 41–53)
Hemoglobin: 13.7 (ref 13.5–17.5)
Neutrophils Absolute: 4
Platelets: 194 (ref 150–399)
WBC: 6.2

## 2017-02-25 DIAGNOSIS — J189 Pneumonia, unspecified organism: Secondary | ICD-10-CM | POA: Diagnosis not present

## 2017-02-26 DIAGNOSIS — J189 Pneumonia, unspecified organism: Secondary | ICD-10-CM | POA: Diagnosis not present

## 2017-02-27 DIAGNOSIS — J189 Pneumonia, unspecified organism: Secondary | ICD-10-CM | POA: Diagnosis not present

## 2017-03-01 ENCOUNTER — Non-Acute Institutional Stay (SKILLED_NURSING_FACILITY): Payer: Medicare Other

## 2017-03-01 DIAGNOSIS — Z0000 Encounter for general adult medical examination without abnormal findings: Secondary | ICD-10-CM

## 2017-03-01 DIAGNOSIS — J189 Pneumonia, unspecified organism: Secondary | ICD-10-CM | POA: Diagnosis not present

## 2017-03-01 NOTE — Progress Notes (Signed)
Subjective:   [name] is a 83 y.o. male who presents for Medicare Annual/Subsequent preventive examination at [place]; incapacitated patient unable to answer questions appropriately.    Objective:    Vitals: BP 98/60 (BP Location: Left Arm, Patient Position: Sitting)   Pulse 72   Temp 98.1 F (36.7 C) (Oral)   Ht 5\' 6"  (1.676 m)   Wt 128 lb (58.1 kg)   SpO2 98%   BMI 20.66 kg/m   Body mass index is 20.66 kg/m.  Tobacco History  Smoking Status  . Former Smoker  . Packs/day: 1.00  . Years: 10.00  . Types: Cigarettes  . Quit date: 08/31/1968  Smokeless Tobacco  . Never Used     Counseling given: Not Answered   Past Medical History:  Diagnosis Date  . Cancer [facility])    Prostate  . Dementia    Alzheimers  . GERD (gastroesophageal reflux disease)   . Heart murmur    systolic murmur no further eval d/t Alzheimers dx  . Hematuria   . Pulmonary nodule 08/17/2014  . Stroke [facility])    TIA   Past Surgical History:  Procedure Laterality Date  . BALLOON DILATION [name] 07/03/2014   Procedure: BALLOON DILATION;  Surgeon: Garlan Fair, MD;  [facility];  Service: Endoscopy;  Laterality: [name];  . BALLOON DILATION [name] 06/03/2015   Procedure: BALLOON DILATION;  Surgeon: Garlan Fair, MD;  [facility];  Service: Endoscopy;  Laterality: [name];  . CYSTOSCOPY [name] 11/07/2014   Procedure: CYSTOSCOPY;  Surgeon: Alexis Frock, MD;  [facility];  Service: Urology;  Laterality: [name];  . ESOPHAGOGASTRODUODENOSCOPY [name] 07/03/2014   Procedure: ESOPHAGOGASTRODUODENOSCOPY (EGD);  Surgeon: Garlan Fair, MD;  Location[facility];  Service: Endoscopy;  Laterality: [name];  . ESOPHAGOGASTRODUODENOSCOPY (EGD) WITH PROPOFOL [name] 06/03/2015   Procedure: ESOPHAGOGASTRODUODENOSCOPY (EGD) WITH PROPOFOL;  Surgeon: Garlan Fair, MD;  [facility];  Service: Endoscopy;  Laterality: [name];  . HERNIA REPAIR  1970   double  . seed implant for  prostate cancer  2006  . TRANSURETHRAL RESECTION OF PROSTATE [name] 11/07/2014   Procedure: TRANSURETHRAL VAPORIZATION OF THE PROSTATE AND BLADDER LESIONS WITH ERBE (NEW GYRUS) REMOVAL OF BLADDER STONE; PROSTATIC URETHRA BIOPSY;  Surgeon: Alexis Frock, MD;  [facility];  Service: Urology;  Laterality: [name];   Family History  Problem Relation Age of Onset  . Heart disease Mother    History  Sexual Activity  . Sexual activity: No    Outpatient Encounter Prescriptions as of 03/01/2017  Medication Sig  . acetaminophen (TYLENOL) 500 MG tablet Take 500 mg by mouth every 6 (six) hours as needed.  . ALPRAZolam (XANAX) 0.25 MG tablet Take 0.25 mg by mouth every 8 (eight) hours as needed for anxiety.  . Amino Acids-Protein Hydrolys (FEEDING SUPPLEMENT, PRO-STAT SUGAR FREE 64,) LIQD Take 30 mLs by mouth 3 (three) times daily with meals.  [name] aspirin EC 81 MG tablet Take 81 mg by mouth daily.  . calcium-vitamin D (OSCAL WITH D) 500-200 MG-UNIT tablet Take 1 tablet by mouth 2 (two) times daily.  . citalopram (CELEXA) 20 MG tablet Take 20 mg by mouth at bedtime.  . Coenzyme Q10 (CO Q 10) 100 MG CAPS Take 1 capsule by mouth daily with breakfast.   . divalproex (DEPAKOTE SPRINKLE) 125 MG capsule Take 125 mg by mouth 2 (two) times daily.   . Multiple Vitamins-Minerals (DECUBI-VITE) CAPS Take by mouth. Take 1 Capsule by mouth  daily for pressure areas  . omeprazole (PRILOSEC) 20 MG capsule Take 20 mg by mouth daily before breakfast.   . Ondansetron 4 MG FILM Take 4 mg by mouth every 8 (eight) hours as needed.   No facility-administered encounter medications on file as of 03/01/2017.     Activities of Daily Living In your present state of health, do you have any difficulty performing the following activities: 11/02/2016  Hearing? Y  Vision? N  Difficulty concentrating or making decisions? Y  Walking or climbing stairs? Y  Dressing or bathing? Y  Doing errands, shopping? Y  Some recent data might be hidden     Patient Care Team: Lajean Manes, MD as PCP - General (Internal Medicine)   Assessment:    Exercise Activities and Dietary recommendations    Goals    None     Fall Risk Fall Risk  03/01/2017  Falls in the past year? Yes  Number falls in past yr: 2 or more  Injury with Fall? Yes   Depression Screen PHQ 2/9 Scores 03/01/2017  PHQ - 2 Score 0    Cognitive Function MMSE - Mini Mental State Exam 03/01/2017  Not completed: Unable to complete         There is no immunization history on file for this patient. Screening Tests Health Maintenance  Topic Date Due  . INFLUENZA VACCINE  03/31/2017  . TETANUS/TDAP  03/10/2021  . PNA vac Low Risk Adult  Completed      Plan:    I have personally reviewed and addressed the Medicare Annual Wellness questionnaire and have noted the following in the patient's chart:  A. Medical and social history B. Use of alcohol, tobacco or illicit drugs  C. Current medications and supplements D. Functional ability and status E.  Nutritional status F.  Physical activity G. Advance directives H. List of other physicians I.  Hospitalizations, surgeries, and ER visits in previous 12 months J.  [place] to include hearing, vision, cognitive, depression L. Referrals and appointments - none  In addition, unable to review and discuss with incapacitated patient certain preventive protocols, quality metrics, and best practice recommendations. A written personalized care plan for preventive services as well as general preventive health recommendations were provided to patient.  See attached scanned questionnaire for additional information.   Signed,   Rich Reining, RN Nurse Health Advisor   Quick Notes   Health Maintenance: Up to date     Abnormal Screen: Unable to complete mental exam     Patient Concerns: None     Nurse Concerns: None

## 2017-03-01 NOTE — Patient Instructions (Signed)
Instructions given, to be scanned in

## 2017-03-03 DIAGNOSIS — F419 Anxiety disorder, unspecified: Secondary | ICD-10-CM | POA: Diagnosis not present

## 2017-03-03 DIAGNOSIS — J189 Pneumonia, unspecified organism: Secondary | ICD-10-CM | POA: Diagnosis not present

## 2017-03-03 DIAGNOSIS — F39 Unspecified mood [affective] disorder: Secondary | ICD-10-CM | POA: Diagnosis not present

## 2017-03-03 DIAGNOSIS — F0390 Unspecified dementia without behavioral disturbance: Secondary | ICD-10-CM | POA: Diagnosis not present

## 2017-03-04 ENCOUNTER — Encounter: Payer: Self-pay | Admitting: Internal Medicine

## 2017-03-04 ENCOUNTER — Non-Acute Institutional Stay (SKILLED_NURSING_FACILITY): Payer: Medicare Other | Admitting: Internal Medicine

## 2017-03-04 DIAGNOSIS — F0150 Vascular dementia without behavioral disturbance: Secondary | ICD-10-CM | POA: Diagnosis not present

## 2017-03-04 DIAGNOSIS — K219 Gastro-esophageal reflux disease without esophagitis: Secondary | ICD-10-CM

## 2017-03-04 DIAGNOSIS — E441 Mild protein-calorie malnutrition: Secondary | ICD-10-CM

## 2017-03-04 DIAGNOSIS — J189 Pneumonia, unspecified organism: Secondary | ICD-10-CM | POA: Diagnosis not present

## 2017-03-04 DIAGNOSIS — G8929 Other chronic pain: Secondary | ICD-10-CM | POA: Diagnosis not present

## 2017-03-04 DIAGNOSIS — F418 Other specified anxiety disorders: Secondary | ICD-10-CM

## 2017-03-04 NOTE — Progress Notes (Signed)
DATE:  March 04, 2017  Location:   [facility] Room Number: [place] of Service: SNF (31)   Extended Emergency Contact Information Primary Emergency Contact: Merlino,Betty Address: [address]           [place], [place] [address] [place] Phone: [phone] Relation: Spouse Secondary Emergency Contact: Burnetta Sabin [place] Phone: [phone] Relation: Sister  Advanced Directive information Does Patient Have a Medical Advance Directive?: Yes, Would patient like information on creating a medical advance directive?: No - Patient declined, Type of Advance Directive: Healthcare Power of [place];Living will;Out of facility DNR (pink MOST or yellow form), Pre-existing out of facility DNR order (yellow form or pink MOST form): Pink MOST form placed in chart (order not valid for inpatient use);Yellow form placed in chart (order not valid for inpatient use), Does patient want to make changes to medical advance directive?: No - Patient declined  Chief Complaint  Patient presents with  . Medical Management of Chronic Issues    1 month follow up    HPI:  83 yo male long term resident seen today for f/u. He has no concerns. No nursing issues. No falls. He is a poor historian due to dementia. Hx obtained from chart.  Hx Bradycardia - stable. HR 72   Depression with anxiety - mood stable on celexa 20 mg daily; depakote 125 mg TID;  xanax 0.25 mg BID. Followed by psych services   Dementia, vascular - unchanged. Current weight 128 lb. (prev 128 lb). He gets nutritional supplements. Albumin 3.5. No longer on namenda  Protein calorie malnutrition - improved. Albumin 3.5. He gets nutritional supplements per facility protocol  GERD - s/p balloon dilatation. Takes prilosec 20 mg daily; zofran prn  Chronic pain syndrome - stable on tylenol 500 mg every 6 hours as needed    B12 deficiency - stable. last B 12 level 2655. He takes MVI daily.  Hgb 13.7  Past Medical History:  Diagnosis Date  . Cancer [facility])    Prostate  . Dementia    Alzheimers  . GERD (gastroesophageal reflux disease)   . Heart murmur    systolic murmur no further eval d/t Alzheimers dx  . Hematuria   . Pulmonary nodule 08/17/2014  . Stroke [facility])    TIA    Past Surgical History:  Procedure Laterality Date  . BALLOON DILATION [name] 07/03/2014   Procedure: BALLOON DILATION;  Surgeon: Garlan Fair, MD;  Location: [facility];  Service: Endoscopy;  Laterality: [name];  . BALLOON DILATION [name] 06/03/2015   Procedure: BALLOON DILATION;  Surgeon: Garlan Fair, MD;  Location: [facility];  Service: Endoscopy;  Laterality: [name];  . CYSTOSCOPY [name] 11/07/2014   Procedure: CYSTOSCOPY;  Surgeon: Alexis Frock, MD;  Location: [facility];  Service: Urology;  Laterality: [name];  . ESOPHAGOGASTRODUODENOSCOPY [name] 07/03/2014   Procedure: ESOPHAGOGASTRODUODENOSCOPY (EGD);  Surgeon: Garlan Fair, MD;  Location: [facility];  Service: Endoscopy;  Laterality: [name];  . ESOPHAGOGASTRODUODENOSCOPY (EGD) WITH PROPOFOL [name] 06/03/2015   Procedure: ESOPHAGOGASTRODUODENOSCOPY (EGD) WITH PROPOFOL;  Surgeon: Garlan Fair, MD;  [facility]: [facility];  Service: Endoscopy;  Laterality: [name];  . HERNIA REPAIR  1970   double  . seed implant for prostate cancer  2006  . TRANSURETHRAL RESECTION OF PROSTATE [name] 11/07/2014   Procedure: TRANSURETHRAL VAPORIZATION OF THE PROSTATE AND BLADDER LESIONS WITH ERBE (NEW GYRUS) REMOVAL OF BLADDER STONE; PROSTATIC URETHRA BIOPSY;  Surgeon: Alexis Frock, MD;  Location:  [facility];  Service: Urology;  Laterality: [name];    Patient Care Team: Lajean Manes, MD as PCP - General (Internal Medicine)  Social History   Social History  . Marital status: Married    Spouse name: [name]  . Number of children: [name]  . Years of education: [name]   Occupational History  . Not on file.   Social History Main Topics  . Smoking status: Former Smoker    Packs/day: 1.00      Years: 10.00    Types: Cigarettes    Quit date: 08/31/1968  . Smokeless tobacco: Never Used  . Alcohol use No  . Drug use: No  . Sexual activity: No   Other Topics Concern  . Not on file   Social History Narrative  . No narrative on file     reports that he quit smoking about 48 years ago. His smoking use included Cigarettes. He has a 10.00 pack-year smoking history. He has never used smokeless tobacco. He reports that he does not drink alcohol or use drugs.  Family History  Problem Relation Age of Onset  . Heart disease Mother    Family Status  Relation Status  . Mother (Not Specified)     There is no immunization history on file for this patient.  No Known Allergies  Medications: Patient's Medications  New Prescriptions   No medications on file  Previous Medications   ACETAMINOPHEN (TYLENOL) 500 MG TABLET    Take 500 mg by mouth every 6 (six) hours as needed.   ALPRAZOLAM (XANAX) 0.25 MG TABLET    Take 0.25 mg by mouth 2 (two) times daily. Give at 6am and 6pm   AMINO ACIDS-PROTEIN HYDROLYS (FEEDING SUPPLEMENT, PRO-STAT SUGAR FREE 64,) LIQD    Take 30 mLs by mouth 3 (three) times daily with meals.   ASPIRIN EC 81 MG TABLET    Take 81 mg by mouth daily.   CALCIUM-VITAMIN D (OSCAL WITH D) 500-200 MG-UNIT TABLET    Take 1 tablet by mouth 2 (two) times daily.   CITALOPRAM (CELEXA) 20 MG TABLET    Take 20 mg by mouth at bedtime.   COENZYME Q10 (CO Q 10) 100 MG CAPS    Take 1 capsule by mouth daily with breakfast.    DIVALPROEX (DEPAKOTE SPRINKLE) 125 MG CAPSULE    Take 125 mg by mouth 3 (three) times daily.    MULTIPLE VITAMINS-MINERALS (DECUBI-VITE) CAPS    Take by mouth. Take 1 Capsule by mouth daily for pressure areas   OMEPRAZOLE (PRILOSEC) 20 MG CAPSULE    Take 20 mg by mouth daily before breakfast.    ONDANSETRON 4 MG FILM    Take 4 mg by mouth every 8 (eight) hours as needed.  Modified Medications   No medications on file  Discontinued Medications   No  medications on file    Review of Systems  Unable to perform ROS: Dementia    Vitals:   03/04/17 1222  BP: 98/60  Pulse: 72  Temp: 98.1 F (36.7 C)  Weight: 128 lb (58.1 kg)  Height: 5\' 6"  (1.676 m)   Body mass index is 20.66 kg/m.  Physical Exam  Constitutional: He appears well-developed.  Frail appearing in NAD, lying in bed  HENT:  Refused to open mouth  Eyes: Pupils are equal, round, and reactive to light. No scleral icterus.  Neck: Neck supple. Carotid bruit is present (b/l systolic from chest). No thyromegaly present.  Cardiovascular: Normal rate, regular rhythm and  intact distal pulses.  Exam reveals no gallop and no friction rub.   Murmur (2/6 SEM --> carotid b/l) heard. No distal LE edema. No calf TTP  Pulmonary/Chest: Effort normal and breath sounds normal. He has no wheezes. He has no rales. He exhibits no tenderness.  Abdominal: Soft. Normal appearance and bowel sounds are normal. He exhibits no distension, no abdominal bruit, no pulsatile midline mass and no mass. There is no hepatomegaly. There is no tenderness. There is no rigidity, no rebound and no guarding. No hernia.  Musculoskeletal: He exhibits edema.  Lymphadenopathy:    He has no cervical adenopathy.  Neurological: He is alert.  Skin: Skin is warm and dry. No rash noted.  Psychiatric: He has a normal mood and affect. His behavior is normal.  Uncooperative today     Labs reviewed: Nursing Home on 02/10/2017  Component Date Value Ref Range Status  . Hemoglobin 02/24/2017 13.7  13.5 - 17.5 Final  . HCT 02/24/2017 41  41 - 53 Final  . Neutrophils Absolute 02/24/2017 4   Final  . Platelets 02/24/2017 194  150 - 399 Final  . WBC 02/24/2017 6.2   Final  . Glucose 02/24/2017 90   Final  . BUN 02/24/2017 14  4 - 21 Final  . Creatinine 02/24/2017 0.7  0.6 - 1.3 Final  . Potassium 02/24/2017 4.4  3.4 - 5.3 Final  . Sodium 02/24/2017 140  137 - 147 Final  . Alkaline Phosphatase 02/24/2017 81  25 - 125  Final  . ALT 02/24/2017 10  10 - 40 Final  . AST 02/24/2017 17  14 - 40 Final  . Bilirubin, Total 02/24/2017 0.3   Final  Abstract on 12/08/2016  Component Date Value Ref Range Status  . Glucose 11/10/2016 86  mg/dL Final  . BUN 11/10/2016 13  4 - 21 mg/dL Final  . Creatinine 11/10/2016 0.7  0.6 - 1.3 mg/dL Final  . Potassium 11/10/2016 4.6  3.4 - 5.3 mmol/L Final  . Sodium 11/10/2016 141  137 - 147 mmol/L Final  . Alkaline Phosphatase 11/10/2016 80  25 - 125 U/L Final  . ALT 11/10/2016 17  10 - 40 U/L Final  . AST 11/10/2016 20  14 - 40 U/L Final  . Bilirubin, Total 11/10/2016 0.3  mg/dL Final    No results found.   Assessment/Plan   ICD-10-CM   1. Vascular dementia without behavioral disturbance - stable F01.50   2. Depression with anxiety F41.8   3. Mild protein-calorie malnutrition (HCC) E44.1   4. Other chronic pain G89.29   5. GERD without esophagitis K21.9     Cont current meds as ordered  Psych services to follow  PT/OT as indicated  Cont nutritional supplements as ordered  Will follow  Aqeel Norgaard S. Perlie Gold  [facility] [address] [place], [place] [address] [phone] Cell (Monday-Friday 8 AM - 5 PM) [phone] After 5 PM and follow prompts

## 2017-03-08 ENCOUNTER — Emergency Department (HOSPITAL_COMMUNITY)
Admission: EM | Admit: 2017-03-08 | Discharge: 2017-03-09 | Disposition: A | Discharge status: Skilled Nursing Facility | Payer: Medicare Other | Attending: Emergency Medicine | Admitting: Emergency Medicine

## 2017-03-08 ENCOUNTER — Encounter (HOSPITAL_COMMUNITY): Payer: Self-pay

## 2017-03-08 DIAGNOSIS — S098XXA Other specified injuries of head, initial encounter: Secondary | ICD-10-CM | POA: Diagnosis not present

## 2017-03-08 DIAGNOSIS — W050XXA Fall from non-moving wheelchair, initial encounter: Secondary | ICD-10-CM | POA: Diagnosis not present

## 2017-03-08 DIAGNOSIS — Z79899 Other long term (current) drug therapy: Secondary | ICD-10-CM | POA: Diagnosis not present

## 2017-03-08 DIAGNOSIS — S0181XA Laceration without foreign body of other part of head, initial encounter: Secondary | ICD-10-CM | POA: Diagnosis not present

## 2017-03-08 DIAGNOSIS — Y999 Unspecified external cause status: Secondary | ICD-10-CM | POA: Insufficient documentation

## 2017-03-08 DIAGNOSIS — G309 Alzheimer's disease, unspecified: Secondary | ICD-10-CM | POA: Insufficient documentation

## 2017-03-08 DIAGNOSIS — Z8546 Personal history of malignant neoplasm of prostate: Secondary | ICD-10-CM | POA: Insufficient documentation

## 2017-03-08 DIAGNOSIS — Y939 Activity, unspecified: Secondary | ICD-10-CM | POA: Diagnosis not present

## 2017-03-08 DIAGNOSIS — S0990XA Unspecified injury of head, initial encounter: Secondary | ICD-10-CM

## 2017-03-08 DIAGNOSIS — Y92129 Unspecified place in nursing home as the place of occurrence of the external cause: Secondary | ICD-10-CM | POA: Insufficient documentation

## 2017-03-08 DIAGNOSIS — Z87891 Personal history of nicotine dependence: Secondary | ICD-10-CM | POA: Insufficient documentation

## 2017-03-08 DIAGNOSIS — W19XXXA Unspecified fall, initial encounter: Secondary | ICD-10-CM

## 2017-03-08 DIAGNOSIS — Z7982 Long term (current) use of aspirin: Secondary | ICD-10-CM | POA: Diagnosis not present

## 2017-03-08 MED ORDER — LIDOCAINE-EPINEPHRINE-TETRACAINE (LET) SOLUTION
3.0000 mL | Freq: Once | NASAL | Status: AC
  Administered 2017-03-08: 21:00:00 3 mL via TOPICAL
  Filled 2017-03-08: qty 3

## 2017-03-08 NOTE — ED Provider Notes (Signed)
[facility] DEPT Provider Note   CSN: [id number] Arrival date & time: 03/08/17  1939     History   Chief Complaint Chief Complaint  Patient presents with  . Fall  . Forehead Laceration    HPI [name] is a 83 y.o. male.  HPI Patient is sent in from rehabilitation facility with report of a fall. He fell from his wheelchair and hit his forehead. He did have some bleeding. No loss of consciousness. Patient takes daily baby aspirin. Reported that he has dementia and will not answer questions. Reportedly normal baseline is for patient to gaze downward and not respond to questions. No other injuries reported. Patient will respond to me but does not give any useful history. He does reiterate and endorse a fall and hitting his head when asked about it. Past Medical History:  Diagnosis Date  . Cancer [facility])    Prostate  . Dementia    Alzheimers  . GERD (gastroesophageal reflux disease)   . Heart murmur    systolic murmur no further eval d/t Alzheimers dx  . Hematuria   . Pulmonary nodule 08/17/2014  . Stroke [facility])    TIA    Patient Active Problem List   Diagnosis Date Noted  . Vitamin B 12 deficiency 11/06/2016  . Vascular dementia without behavioral disturbance 11/06/2016  . Chronic pain 11/06/2016  . GERD without esophagitis 11/06/2016  . Depression with anxiety 11/06/2016  . Protein-calorie malnutrition, severe ([place]) 08/18/2014  . Sinus bradycardia 08/17/2014    Past Surgical History:  Procedure Laterality Date  . BALLOON DILATION [name] 07/03/2014   Procedure: BALLOON DILATION;  Surgeon: Garlan Fair, MD;  Location: [facility];  Service: Endoscopy;  Laterality: [name];  . BALLOON DILATION [name] 06/03/2015   Procedure: BALLOON DILATION;  Surgeon: Garlan Fair, MD;  [facility];  Service: Endoscopy;  Laterality: [name];  . CYSTOSCOPY [name] 11/07/2014   Procedure: CYSTOSCOPY;  Surgeon: Alexis Frock, MD;  [facility];  Service: Urology;  Laterality:  [name];  . ESOPHAGOGASTRODUODENOSCOPY [name] 07/03/2014   Procedure: ESOPHAGOGASTRODUODENOSCOPY (EGD);  Surgeon: Garlan Fair, MD;  Location: [facility];  Service: Endoscopy;  Laterality: [name];  . ESOPHAGOGASTRODUODENOSCOPY (EGD) WITH PROPOFOL [name] 06/03/2015   Procedure: ESOPHAGOGASTRODUODENOSCOPY (EGD) WITH PROPOFOL;  Surgeon: Garlan Fair, MD;  [facility];  Service: Endoscopy;  Laterality: [name];  . HERNIA REPAIR  1970   double  . seed implant for prostate cancer  2006  . TRANSURETHRAL RESECTION OF PROSTATE [name] 11/07/2014   Procedure: TRANSURETHRAL VAPORIZATION OF THE PROSTATE AND BLADDER LESIONS WITH ERBE (NEW GYRUS) REMOVAL OF BLADDER STONE; PROSTATIC URETHRA BIOPSY;  Surgeon: Alexis Frock, MD;  Location: [facility];  Service: Urology;  Laterality: [name];       Home Medications    Prior to Admission medications   Medication Sig Start Date End Date Taking? Authorizing Provider  ALPRAZolam (XANAX) 0.25 MG tablet Take 0.25 mg by mouth 2 (two) times daily. Give at 6am and 6pm   Yes [provider]  Amino Acids-Protein Hydrolys (FEEDING SUPPLEMENT, PRO-STAT SUGAR FREE 64,) LIQD Take 30 mLs by mouth daily.    Yes [provider]  aspirin EC 81 MG tablet Take 81 mg by mouth daily.   Yes [provider]  calcium-vitamin D (OSCAL WITH D) 500-200 MG-UNIT tablet Take 1 tablet by mouth 2 (two) times daily.   Yes [provider]  citalopram (CELEXA) 20 MG tablet Take 20 mg by mouth at bedtime.   Yes  [provider]  Coenzyme Q10 (CO Q 10) 100 MG CAPS Take 1 capsule by mouth daily with breakfast.    Yes [provider]  divalproex (DEPAKOTE SPRINKLE) 125 MG capsule Take 125 mg by mouth 3 (three) times daily.    Yes [provider]  Multiple Vitamins-Minerals (DECUBI-VITE) CAPS Take by mouth. Take 1 Capsule by mouth daily for pressure areas   Yes [provider]  omeprazole (PRILOSEC) 20 MG capsule Take 20 mg by mouth daily  before breakfast.    Yes [provider]  acetaminophen (TYLENOL) 500 MG tablet Take 500 mg by mouth every 6 (six) hours as needed for moderate pain.     [provider]  Ondansetron 4 MG FILM Take 4 mg by mouth every 8 (eight) hours as needed (nausea and vomiting).     [provider]    Family History Family History  Problem Relation Age of Onset  . Heart disease Mother     Social History Social History  Substance Use Topics  . Smoking status: Former Smoker    Packs/day: 1.00    Years: 10.00    Types: Cigarettes    Quit date: 08/31/1968  . Smokeless tobacco: Never Used  . Alcohol use No     Allergies   Patient has no known allergies.   Review of Systems Review of Systems 10 Systems reviewed and are negative for acute change except as noted in the HPI.   Physical Exam Updated Vital Signs BP 125/76 (BP Location: Left Arm)   Pulse 72   Temp 97.8 F (36.6 C) (Oral)   Resp 14   Ht 5\' 6"  (1.676 m)   Wt 59 kg (130 lb)   SpO2 100%   BMI 20.98 kg/m   Physical Exam  Constitutional:  Patient is lying in the stretcher in fetal position. He does briefly look up and interact with me verbally but is very difficult to understand and then disregards again. No respiratory distress.  HENT:  Right Ear: External ear normal.  Left Ear: External ear normal.  Nose: Nose normal.  Mouth/Throat: Oropharynx is clear and moist.  One cm laceration in center of forehead., Superficial without gaping or active bleeding. Small underlying hematoma.  Eyes: EOM are normal.  Cardiovascular: Normal rate and intact distal pulses.   Pulmonary/Chest: Effort normal and breath sounds normal.  Abdominal: Soft. He exhibits no distension. There is no tenderness.  Musculoskeletal:  Patient keeps himself in a generally fetal position. He will not cooperate for straightening his legs or arms. No obvious deformity. He will spontaneously rearrange himself and move his arms and  legs.  Neurological:  Patient is fairly quiet and restful but with verbal interaction will make a verbal response.  Skin: Skin is warm and dry.  Psychiatric:  Patient is calm and withdrawn     ED Treatments / Results  Labs (all labs ordered are listed, but only abnormal results are displayed) Labs Reviewed - No data to display  EKG  EKG Interpretation None       Radiology No results found.  Procedures .Marland KitchenLaceration Repair Date/Time: 03/08/2017 9:35 PM Performed by: Charlesetta Shanks Authorized by: Charlesetta Shanks   Anesthesia (see MAR for exact dosages):    Anesthesia method:  Topical application   Topical anesthetic:  LET Laceration details:    Location:  Face   Face location:  Forehead   Length (cm):  1   Depth (mm):  1 Repair type:    Repair  type:  Simple Exploration:    Contaminated: no   Treatment:    Area cleansed with:  Shur-Clens   Amount of cleaning:  Standard Skin repair:    Repair method:  Tissue adhesive Approximation:    Approximation:  Close   Vermilion border: well-aligned   Post-procedure details:    Dressing:  Adhesive bandage   Patient tolerance of procedure:  Tolerated well, no immediate complications   (including critical care time)  Medications Ordered in ED Medications  lidocaine-EPINEPHrine-tetracaine (LET) solution (3 mLs Topical Given 03/08/17 2058)     Initial Impression / Assessment and Plan / ED Course  I have reviewed the triage vital signs and the nursing notes.  Pertinent labs & imaging results that were available during my care of the patient were reviewed by me and considered in my medical decision making (see chart for details).     Final Clinical Impressions(s) / ED Diagnoses   Final diagnoses:  Fall, initial encounter  Laceration of forehead, initial encounter  Minor head injury, initial encounter   Patient is difficult to assess due to baseline dementia. No additional historians are present. At this time  patient's fall is from reportedly his wheelchair to his forehead. Patient had small hematoma with very superficial forehead laceration which was repaired with Dermabond. Reportedly patient is at baseline. At this time he'll be return to nursing home care for ongoing observation for any changes from baseline function. New Prescriptions New Prescriptions   No medications on file     Charlesetta Shanks, MD 03/08/17 2140

## 2017-03-08 NOTE — ED Notes (Signed)
Bed: [facility]ALA Expected date:  Expected time:  Means of arrival:  Comments: No Bed 

## 2017-03-08 NOTE — ED Notes (Signed)
Bed: WA05 Expected date:  Expected time:  Means of arrival:  Comments: EMS 

## 2017-03-08 NOTE — ED Notes (Signed)
Dr.Pfieffer at bedside to apply dermabond.

## 2017-03-08 NOTE — ED Triage Notes (Signed)
Patient arrives by EMS from [facility] with complaints of fall forward to floor from his wheelchair 20 minutes ago with laceration to mid forehead/bleeding controlled. No LOC/on baby aspirin. Patient has dementia and will not answer questions. Patient's normal that he will just look down and not answer any questions. Patient looked up briefly when name called. No other injuries.

## 2017-03-08 NOTE — ED Notes (Signed)
[facility] notified of need for transport of pt back to residence.  

## 2017-03-09 ENCOUNTER — Encounter: Payer: Self-pay | Admitting: Adult Health

## 2017-03-09 ENCOUNTER — Non-Acute Institutional Stay (SKILLED_NURSING_FACILITY): Payer: Medicare Other | Admitting: Adult Health

## 2017-03-09 DIAGNOSIS — R4182 Altered mental status, unspecified: Secondary | ICD-10-CM | POA: Diagnosis not present

## 2017-03-09 DIAGNOSIS — F01518 Vascular dementia, unspecified severity, with other behavioral disturbance: Secondary | ICD-10-CM

## 2017-03-09 DIAGNOSIS — R918 Other nonspecific abnormal finding of lung field: Secondary | ICD-10-CM | POA: Diagnosis not present

## 2017-03-09 DIAGNOSIS — Y92129 Unspecified place in nursing home as the place of occurrence of the external cause: Secondary | ICD-10-CM | POA: Diagnosis not present

## 2017-03-09 DIAGNOSIS — W19XXXA Unspecified fall, initial encounter: Secondary | ICD-10-CM | POA: Diagnosis not present

## 2017-03-09 DIAGNOSIS — F0151 Vascular dementia with behavioral disturbance: Secondary | ICD-10-CM

## 2017-03-09 DIAGNOSIS — F0390 Unspecified dementia without behavioral disturbance: Secondary | ICD-10-CM | POA: Diagnosis not present

## 2017-03-10 ENCOUNTER — Other Ambulatory Visit: Payer: Self-pay | Admitting: Adult Health

## 2017-03-10 DIAGNOSIS — J189 Pneumonia, unspecified organism: Secondary | ICD-10-CM | POA: Diagnosis not present

## 2017-03-10 DIAGNOSIS — D649 Anemia, unspecified: Secondary | ICD-10-CM | POA: Diagnosis not present

## 2017-03-10 DIAGNOSIS — Z5181 Encounter for therapeutic drug level monitoring: Secondary | ICD-10-CM | POA: Diagnosis not present

## 2017-03-10 DIAGNOSIS — S0990XA Unspecified injury of head, initial encounter: Secondary | ICD-10-CM

## 2017-03-10 LAB — CBC AND DIFFERENTIAL
HEMATOCRIT: 45 (ref 41–53)
HEMOGLOBIN: 14.6 (ref 13.5–17.5)
NEUTROS ABS: 4
PLATELETS: 192 (ref 150–399)
WBC: 6.5

## 2017-03-10 LAB — BASIC METABOLIC PANEL
BUN: 15 (ref 4–21)
Creatinine: 0.7 (ref 0.6–1.3)
Glucose: 90
Potassium: 4.3 (ref 3.4–5.3)
SODIUM: 145 (ref 137–147)

## 2017-03-11 DIAGNOSIS — J189 Pneumonia, unspecified organism: Secondary | ICD-10-CM | POA: Diagnosis not present

## 2017-03-12 ENCOUNTER — Other Ambulatory Visit: Payer: Medicare Other

## 2017-03-12 DIAGNOSIS — J189 Pneumonia, unspecified organism: Secondary | ICD-10-CM | POA: Diagnosis not present

## 2017-03-15 DIAGNOSIS — J189 Pneumonia, unspecified organism: Secondary | ICD-10-CM | POA: Diagnosis not present

## 2017-03-16 DIAGNOSIS — J189 Pneumonia, unspecified organism: Secondary | ICD-10-CM | POA: Diagnosis not present

## 2017-03-17 DIAGNOSIS — J189 Pneumonia, unspecified organism: Secondary | ICD-10-CM | POA: Diagnosis not present

## 2017-03-17 NOTE — Progress Notes (Signed)
Location:   [place] Room Number: 102 A Place of Service:  SNF (31)   CODE STATUS: DNR  No Known Allergies  Chief Complaint  Patient presents with  . Acute Visit    ER Follow up    HPI:  He is a 83 year old long term resident of this facility who suffered a fall on  03-08-17. He was less responsive than normal; he is unable to participate in the hpi or ros. He was taken to the ED; there it was felt that he is at his baseline. He remains less responsive a this time.  We have spoken with his family; they do not wish for any ct scans to be done. They have voiced understanding of the information given regarding the reisks and benefits of not having the ct scan.  His 02 sats are low at 88% and he was started on 02 at 2L.   Past Medical History:  Diagnosis Date  . Cancer [facility])    Prostate  . Dementia    Alzheimers  . GERD (gastroesophageal reflux disease)   . Heart murmur    systolic murmur no further eval d/t Alzheimers dx  . Hematuria   . Pulmonary nodule 08/17/2014  . Stroke [facility])    TIA    Past Surgical History:  Procedure Laterality Date  . BALLOON DILATION [name] 07/03/2014   Procedure: BALLOON DILATION;  Surgeon: Garlan Fair, MD;  Location: [facility];  Service: Endoscopy;  Laterality: [name];  . BALLOON DILATION [name] 06/03/2015   Procedure: BALLOON DILATION;  Surgeon: Garlan Fair, MD;  [facility];  Service: Endoscopy;  Laterality: [name];  . CYSTOSCOPY [name] 11/07/2014   Procedure: CYSTOSCOPY;  Surgeon: Alexis Frock, MD;  [facility];  Service: Urology;  Laterality: [name];  . ESOPHAGOGASTRODUODENOSCOPY [name] 07/03/2014   Procedure: ESOPHAGOGASTRODUODENOSCOPY (EGD);  Surgeon: Garlan Fair, MD;  Location: [facility];  Service: Endoscopy;  Laterality: [name];  . ESOPHAGOGASTRODUODENOSCOPY (EGD) WITH PROPOFOL [name] 06/03/2015   Procedure: ESOPHAGOGASTRODUODENOSCOPY (EGD) WITH PROPOFOL;  Surgeon: Garlan Fair, MD;  [facility];   Service: Endoscopy;  Laterality: [name];  . HERNIA REPAIR  1970   double  . seed implant for prostate cancer  2006  . TRANSURETHRAL RESECTION OF PROSTATE [name] 11/07/2014   Procedure: TRANSURETHRAL VAPORIZATION OF THE PROSTATE AND BLADDER LESIONS WITH ERBE (NEW GYRUS) REMOVAL OF BLADDER STONE; PROSTATIC URETHRA BIOPSY;  Surgeon: Alexis Frock, MD;  [facility];  Service: Urology;  Laterality: [name];    Social History   Social History  . Marital status: Married    Spouse name: [name]  . Number of children: [name]  . Years of education: [name]   Occupational History  . Not on file.   Social History Main Topics  . Smoking status: Former Smoker    Packs/day: 1.00    Years: 10.00    Types: Cigarettes    Quit date: 08/31/1968  . Smokeless tobacco: Never Used  . Alcohol use No  . Drug use: No  . Sexual activity: No   Other Topics Concern  . Not on file   Social History Narrative  . No narrative on file   Family History  Problem Relation Age of Onset  . Heart disease Mother       VITAL SIGNS BP 106/62   Pulse 68   Temp 98.1 F (36.7 C)   Resp 18   Ht 5\' 6"  (1.676 m)   Wt 126 lb 12.8 oz (57.5  kg)   SpO2 (!) 89%   BMI 20.47 kg/m   Patient's Medications  New Prescriptions   No medications on file  Previous Medications   ACETAMINOPHEN (TYLENOL) 500 MG TABLET    Take 500 mg by mouth every 6 (six) hours as needed for moderate pain.    ALPRAZOLAM (XANAX) 0.25 MG TABLET    Take 0.25 mg by mouth 2 (two) times daily. Give at 6am and 6pm   AMINO ACIDS-PROTEIN HYDROLYS (FEEDING SUPPLEMENT, PRO-STAT SUGAR FREE 64,) LIQD    Take 30 mLs by mouth daily.    ASPIRIN EC 81 MG TABLET    Take 81 mg by mouth daily.   CALCIUM-VITAMIN D (OSCAL WITH D) 500-200 MG-UNIT TABLET    Take 1 tablet by mouth 2 (two) times daily.   CITALOPRAM (CELEXA) 20 MG TABLET    Take 20 mg by mouth at bedtime.   COENZYME Q10 (CO Q 10) 100 MG CAPS    Take 1 capsule by mouth daily with breakfast.    DIVALPROEX (DEPAKOTE  SPRINKLE) 125 MG CAPSULE    Take 125 mg by mouth 3 (three) times daily.    MULTIPLE VITAMINS-MINERALS (DECUBI-VITE) CAPS    Take by mouth. Take 1 Capsule by mouth daily for pressure areas   NUTRITIONAL SUPPLEMENT LIQD    House 2.0 - Med Pass Give 12 cc by mouth two times daily for weight decline   OMEPRAZOLE (PRILOSEC) 20 MG CAPSULE    Take 20 mg by mouth daily before breakfast.    ONDANSETRON 4 MG FILM    Take 4 mg by mouth every 8 (eight) hours as needed (nausea and vomiting).   Modified Medications   No medications on file  Discontinued Medications   No medications on file     SIGNIFICANT DIAGNOSTIC EXAMS PREVIOUS   10-18-16: ct of head and cervical spine: 1. No evidence of significant acute traumatic injury to the skull, brain or cervical spine. 2. Mild cerebral atrophy with extensive chronic microvascular ischemic changes in the cerebral white matter redemonstrated. 3. Multilevel degenerative disc disease and cervical spondylosis, as above.  11-01-16: chest x-ray: Right lower lobe infiltrate.  NO NEW EXAMS   LABS REVIEWED:   11-01-16: wbc 12.6; hgb 15.9; hct 48.5; mcv 89.5; plt 437; glucose 122; bun 40; creat 1.28; k+ 3.8; na++ 155; blood culture no growth; urine culture: no growth; HIV: nr; tsh 1.980 11-02-16: wbc 9.2; hgb 15.4; hct 46.5; mcv 89.4; plt 338; glucose 124; bun 32; creat 1.25; k+ 3.4; na++ 154; ast 48; total bili 1.7; albumin 2.8 11-03-16: wbc 7.1; hgb 13.1; hct 41.3; mcv 90.4; plt 288; glucose 99; bun 19; creat 1.18; k+ 3.0; na++ 146 mag 2.0; ammonia 49; vit B 12: 2265 11-04-16: wbc 9.1; hgb 12.7; hct 39.2; mcv 87.3; plt 294; glucose 93; bun 15; creat 0.82; k+ 3.8; na++ 140; ammonia 31 11-10-16: glucose 86; bun 13.3; creat 0.69; k+ 4.6; na++ 141; liver normal albumin 3.0   NO NEW LABS   Review of Systems  Unable to perform ROS: Dementia (is non verbal )   Physical Exam  Constitutional: No distress.  Eyes: Conjunctivae are normal.  Neck: Neck supple. No JVD present. No  thyromegaly present.  Has bilateral carotid bruit   Cardiovascular: Normal rate, regular rhythm and intact distal pulses.   Murmur heard. Respiratory: Effort normal and breath sounds normal. No respiratory distress. He has no wheezes.  GI: Soft. Bowel sounds are normal. He exhibits no distension. There is no tenderness.  Musculoskeletal: He exhibits no edema.  Is laying in fetal position is resistant to movement    Lymphadenopathy:    He has no cervical adenopathy.  Neurological:  Is aware   Skin: Skin is warm and dry. He is not diaphoretic.  Psychiatric:  Is resistant to care     ASSESSMENT/ PLAN:  1.  Dementia: his weight is  128  pounds: will continue namenda 10 mg  daily  .   2. Fall at nursing home: the facility continues to provide him with an environment that is safe as possible to help prevent injury.  his family has decided not to have any further ct scans done at this time. The risks and benefits of this plan of care ahs been explained and they have verbalized understanding    Will check cbc; cmp chest x-ray as he is presently requiring 02 and is less responsive    Ok Edwards NP [facility]  Contact [phone] Monday through Friday 8am- 5pm  After hours call [phone]

## 2017-03-18 DIAGNOSIS — F0390 Unspecified dementia without behavioral disturbance: Secondary | ICD-10-CM | POA: Diagnosis not present

## 2017-03-18 DIAGNOSIS — J189 Pneumonia, unspecified organism: Secondary | ICD-10-CM | POA: Diagnosis not present

## 2017-03-18 DIAGNOSIS — F419 Anxiety disorder, unspecified: Secondary | ICD-10-CM | POA: Diagnosis not present

## 2017-03-18 DIAGNOSIS — R569 Unspecified convulsions: Secondary | ICD-10-CM | POA: Diagnosis not present

## 2017-03-18 DIAGNOSIS — F39 Unspecified mood [affective] disorder: Secondary | ICD-10-CM | POA: Diagnosis not present

## 2017-03-18 DIAGNOSIS — Z5181 Encounter for therapeutic drug level monitoring: Secondary | ICD-10-CM | POA: Diagnosis not present

## 2017-03-19 DIAGNOSIS — J189 Pneumonia, unspecified organism: Secondary | ICD-10-CM | POA: Diagnosis not present

## 2017-03-22 DIAGNOSIS — J189 Pneumonia, unspecified organism: Secondary | ICD-10-CM | POA: Diagnosis not present

## 2017-03-23 DIAGNOSIS — W19XXXA Unspecified fall, initial encounter: Secondary | ICD-10-CM | POA: Insufficient documentation

## 2017-03-23 DIAGNOSIS — F01518 Vascular dementia, unspecified severity, with other behavioral disturbance: Secondary | ICD-10-CM | POA: Insufficient documentation

## 2017-03-23 DIAGNOSIS — Y92129 Unspecified place in nursing home as the place of occurrence of the external cause: Secondary | ICD-10-CM

## 2017-03-23 DIAGNOSIS — J189 Pneumonia, unspecified organism: Secondary | ICD-10-CM | POA: Diagnosis not present

## 2017-03-23 DIAGNOSIS — F0151 Vascular dementia with behavioral disturbance: Secondary | ICD-10-CM | POA: Insufficient documentation

## 2017-03-24 DIAGNOSIS — J189 Pneumonia, unspecified organism: Secondary | ICD-10-CM | POA: Diagnosis not present

## 2017-03-25 DIAGNOSIS — J189 Pneumonia, unspecified organism: Secondary | ICD-10-CM | POA: Diagnosis not present

## 2017-03-26 DIAGNOSIS — J189 Pneumonia, unspecified organism: Secondary | ICD-10-CM | POA: Diagnosis not present

## 2017-03-29 DIAGNOSIS — J189 Pneumonia, unspecified organism: Secondary | ICD-10-CM | POA: Diagnosis not present

## 2017-03-30 DIAGNOSIS — J189 Pneumonia, unspecified organism: Secondary | ICD-10-CM | POA: Diagnosis not present

## 2017-03-31 DIAGNOSIS — M6281 Muscle weakness (generalized): Secondary | ICD-10-CM | POA: Diagnosis not present

## 2017-03-31 DIAGNOSIS — J189 Pneumonia, unspecified organism: Secondary | ICD-10-CM | POA: Diagnosis not present

## 2017-04-01 DIAGNOSIS — M6281 Muscle weakness (generalized): Secondary | ICD-10-CM | POA: Diagnosis not present

## 2017-04-01 DIAGNOSIS — J189 Pneumonia, unspecified organism: Secondary | ICD-10-CM | POA: Diagnosis not present

## 2017-04-02 DIAGNOSIS — J189 Pneumonia, unspecified organism: Secondary | ICD-10-CM | POA: Diagnosis not present

## 2017-04-02 DIAGNOSIS — M6281 Muscle weakness (generalized): Secondary | ICD-10-CM | POA: Diagnosis not present

## 2017-04-06 ENCOUNTER — Non-Acute Institutional Stay (SKILLED_NURSING_FACILITY): Payer: Medicare Other | Admitting: Adult Health

## 2017-04-06 ENCOUNTER — Encounter: Payer: Self-pay | Admitting: Adult Health

## 2017-04-06 DIAGNOSIS — K219 Gastro-esophageal reflux disease without esophagitis: Secondary | ICD-10-CM | POA: Diagnosis not present

## 2017-04-06 DIAGNOSIS — M6281 Muscle weakness (generalized): Secondary | ICD-10-CM | POA: Diagnosis not present

## 2017-04-06 DIAGNOSIS — F418 Other specified anxiety disorders: Secondary | ICD-10-CM | POA: Diagnosis not present

## 2017-04-06 DIAGNOSIS — J189 Pneumonia, unspecified organism: Secondary | ICD-10-CM | POA: Diagnosis not present

## 2017-04-06 DIAGNOSIS — F0151 Vascular dementia with behavioral disturbance: Secondary | ICD-10-CM

## 2017-04-06 DIAGNOSIS — F01518 Vascular dementia, unspecified severity, with other behavioral disturbance: Secondary | ICD-10-CM

## 2017-04-06 NOTE — Progress Notes (Signed)
Location:   [place] Room Number: 102 A Place of Service:  SNF (31)   CODE STATUS: DNR  No Known Allergies  Chief Complaint  Patient presents with  . Medical Management of Chronic Issues    1 month follow up    HPI:  He is an 83 year old long ter resident of this facility being seen for the management of his chronic illnesses: gerd; depression with anxiety and dementia. He is presently laying in bed in the fetal position. He is unable to participate in the hpi or ros. He does get out of bed on most afternoons. He does not appear to be in any pain. There are no reports of any change in his appetite. There are no nursing concerns at this time.   Past Medical History:  Diagnosis Date  . Cancer [facility])    Prostate  . Dementia    Alzheimers  . GERD (gastroesophageal reflux disease)   . Heart murmur    systolic murmur no further eval d/t Alzheimers dx  . Hematuria   . Pulmonary nodule 08/17/2014  . Stroke [facility])    TIA    Past Surgical History:  Procedure Laterality Date  . BALLOON DILATION [name] 07/03/2014   Procedure: BALLOON DILATION;  Surgeon: Garlan Fair, MD;  Location: [facility];  Service: Endoscopy;  Laterality: [name];  . BALLOON DILATION [name] 06/03/2015   Procedure: BALLOON DILATION;  Surgeon: Garlan Fair, MD;  [facility];  Service: Endoscopy;  Laterality: [name];  . CYSTOSCOPY [name] 11/07/2014   Procedure: CYSTOSCOPY;  Surgeon: Alexis Frock, MD;  [facility];  Service: Urology;  Laterality: [name];  . ESOPHAGOGASTRODUODENOSCOPY [name] 07/03/2014   Procedure: ESOPHAGOGASTRODUODENOSCOPY (EGD);  Surgeon: Garlan Fair, MD;  Location: [facility];  Service: Endoscopy;  Laterality: [name];  . ESOPHAGOGASTRODUODENOSCOPY (EGD) WITH PROPOFOL [name] 06/03/2015   Procedure: ESOPHAGOGASTRODUODENOSCOPY (EGD) WITH PROPOFOL;  Surgeon: Garlan Fair, MD;  [facility];  Service: Endoscopy;  Laterality: [name];  . HERNIA REPAIR  1970   double  . seed  implant for prostate cancer  2006  . TRANSURETHRAL RESECTION OF PROSTATE [name] 11/07/2014   Procedure: TRANSURETHRAL VAPORIZATION OF THE PROSTATE AND BLADDER LESIONS WITH ERBE (NEW GYRUS) REMOVAL OF BLADDER STONE; PROSTATIC URETHRA BIOPSY;  Surgeon: Alexis Frock, MD;  [facility];  Service: Urology;  Laterality: [name];    Social History   Social History  . Marital status: Married    Spouse name: [name]  . Number of children: [name]  . Years of education: [name]   Occupational History  . Not on file.   Social History Main Topics  . Smoking status: Former Smoker    Packs/day: 1.00    Years: 10.00    Types: Cigarettes    Quit date: 08/31/1968  . Smokeless tobacco: Never Used  . Alcohol use No  . Drug use: No  . Sexual activity: No   Other Topics Concern  . Not on file   Social History Narrative  . No narrative on file   Family History  Problem Relation Age of Onset  . Heart disease Mother       VITAL SIGNS BP 127/72   Pulse 85   Temp 97.8 F (36.6 C)   Resp 17   Ht 5\' 6"  (1.676 m)   Wt 129 lb 3.2 oz (58.6 kg)   SpO2 95%   BMI 20.85 kg/m   Patient's Medications  New Prescriptions   No medications on file  Previous Medications   ACETAMINOPHEN (TYLENOL) 500 MG TABLET    Take 500 mg by mouth every 6 (six) hours as needed for moderate pain.    ALPRAZOLAM (XANAX) 0.25 MG TABLET    Take 0.25 mg by mouth 2 (two) times daily. Give at 6am and 6pm   AMINO ACIDS-PROTEIN HYDROLYS (FEEDING SUPPLEMENT, PRO-STAT SUGAR FREE 64,) LIQD    Take 30 mLs by mouth every evening.    ASPIRIN EC 81 MG TABLET    Take 81 mg by mouth daily.   CALCIUM-VITAMIN D (OSCAL WITH D) 500-200 MG-UNIT TABLET    Take 1 tablet by mouth 2 (two) times daily.   CITALOPRAM (CELEXA) 20 MG TABLET    Take 20 mg by mouth at bedtime.   COENZYME Q10 (CO Q 10) 100 MG CAPS    Take 1 capsule by mouth daily with breakfast.    DIVALPROEX (DEPAKOTE SPRINKLE) 125 MG CAPSULE    Take 125 mg by mouth 3 (three) times daily.     MULTIPLE VITAMINS-MINERALS (DECUBI-VITE) CAPS    Take by mouth. Take 1 Capsule by mouth daily for pressure areas   NUTRITIONAL SUPPLEMENT LIQD    House 2.0 - Med Pass Give 120 cc by mouth two times daily for weight decline   OMEPRAZOLE (PRILOSEC) 20 MG CAPSULE    Take 20 mg by mouth daily before breakfast.    ONDANSETRON 4 MG FILM    Take 4 mg by mouth every 8 (eight) hours as needed (nausea and vomiting).    OXYGEN    2L/min via nasal cannula as needed for O2 sat less than or equal to 90%  Modified Medications   No medications on file  Discontinued Medications   No medications on file     SIGNIFICANT DIAGNOSTIC EXAMS  PREVIOUS  10-18-16: ct of head and cervical spine: 1. No evidence of significant acute traumatic injury to the skull, brain or cervical spine. 2. Mild cerebral atrophy with extensive chronic microvascular ischemic changes in the cerebral white matter redemonstrated. 3. Multilevel degenerative disc disease and cervical spondylosis, as above.  11-01-16: chest x-ray: Right lower lobe infiltrate.  TODAY:  03-09-17: chest x-ray: no hyperexpansion and clear lungs    LABS REVIEWED: PREVIOUS  11-01-16: wbc 12.6; hgb 15.9; hct 48.5; mcv 89.5; plt 437; glucose 122; bun 40; creat 1.28; k+ 3.8; na++ 155; blood culture no growth; urine culture: no growth; HIV: nr; tsh 1.980 11-02-16: wbc 9.2; hgb 15.4; hct 46.5; mcv 89.4; plt 338; glucose 124; bun 32; creat 1.25; k+ 3.4; na++ 154; ast 48; total bili 1.7; albumin 2.8 11-03-16: wbc 7.1; hgb 13.1; hct 41.3; mcv 90.4; plt 288; glucose 99; bun 19; creat 1.18; k+ 3.0; na++ 146 mag 2.0; ammonia 49; vit B 12: 2265 11-04-16: wbc 9.1; hgb 12.7; hct 39.2; mcv 87.3; plt 294; glucose 93; bun 15; creat 0.82; k+ 3.8; na++ 140; ammonia 31 11-10-16: glucose 86; bun 13.3; creat 0.69; k+ 4.6; na++ 141; liver normal albumin 3.0   TODAY:  03-10-17: wbc 6.5; hgb 14.6; hct 44.6; mcv 92.5; plt 192;glucose 90; bun 15.0; creat 0.70; k+ 4.3; na++ 145; ca 91. 03-18-17:  depakote: 12    Review of Systems  Unable to perform ROS: Dementia (nonverbal )   Physical Exam  Constitutional: No distress.  Eyes: Conjunctivae are normal.  Neck: Neck supple. No JVD present. No thyromegaly present.  Bilateral carotid bruit   Cardiovascular: Normal rate, regular rhythm and intact distal pulses.   Murmur heard. Respiratory: Effort  normal and breath sounds normal. No respiratory distress. He has no wheezes.  GI: Soft. Bowel sounds are normal. He exhibits no distension. There is no tenderness.  Musculoskeletal: He exhibits no edema.  Able to move all extremities  Is presently laying in fetal position   Lymphadenopathy:    He has no cervical adenopathy.  Neurological:  Aware   Skin: Skin is warm and dry. He is not diaphoretic.  Psychiatric: He has a normal mood and affect.    ASSESSMENT/ PLAN:  TODAY:   1. Depression with anxiety: is stable  is presently on celexa 20 mg daily. Will continue depakote 125 mg three times daily to help stabilize mood and will continue xanax 0.25 mg twice daily   2. Gerd: has history of balloon dilatation: is stable  will continue prilosec 20 mg daily  3.  Dementia: no significant change in his status: his weight is 129  Pounds: is currently off medications; will monitor .  PREVIOUS:    4. Protein calorie malnutrition: no significant change in status:  weight is 129  pounds; albumin 3.0; will continue prostat  30 cc prior to supper   5. Chronic pain: no indications of pain present. Will continue tylenol 500 mg every 6 hours as needed and will monitor   6. Vit B deficiency: vit B 12: 2655;is stable  will monitor      Ok Edwards NP [facility]  Contact [phone] Monday through Friday 8am- 5pm  After hours call [phone]

## 2017-04-07 DIAGNOSIS — M6281 Muscle weakness (generalized): Secondary | ICD-10-CM | POA: Diagnosis not present

## 2017-04-07 DIAGNOSIS — J189 Pneumonia, unspecified organism: Secondary | ICD-10-CM | POA: Diagnosis not present

## 2017-04-08 DIAGNOSIS — J189 Pneumonia, unspecified organism: Secondary | ICD-10-CM | POA: Diagnosis not present

## 2017-04-08 DIAGNOSIS — M6281 Muscle weakness (generalized): Secondary | ICD-10-CM | POA: Diagnosis not present

## 2017-04-30 DIAGNOSIS — Z79899 Other long term (current) drug therapy: Secondary | ICD-10-CM | POA: Diagnosis not present

## 2017-04-30 DIAGNOSIS — D519 Vitamin B12 deficiency anemia, unspecified: Secondary | ICD-10-CM | POA: Diagnosis not present

## 2017-04-30 DIAGNOSIS — D649 Anemia, unspecified: Secondary | ICD-10-CM | POA: Diagnosis not present

## 2017-04-30 LAB — VITAMIN B12: Vitamin B-12: 1008

## 2017-05-06 ENCOUNTER — Encounter: Payer: Self-pay | Admitting: Adult Health

## 2017-05-06 ENCOUNTER — Non-Acute Institutional Stay (SKILLED_NURSING_FACILITY): Payer: Medicare Other | Admitting: Adult Health

## 2017-05-06 DIAGNOSIS — G8929 Other chronic pain: Secondary | ICD-10-CM

## 2017-05-06 DIAGNOSIS — E538 Deficiency of other specified B group vitamins: Secondary | ICD-10-CM | POA: Diagnosis not present

## 2017-05-06 DIAGNOSIS — E43 Unspecified severe protein-calorie malnutrition: Secondary | ICD-10-CM

## 2017-05-06 NOTE — Progress Notes (Signed)
Location:   [place] Room Number: 102 A Place of Service:  SNF (31)   CODE STATUS: DNR  No Known Allergies  Chief Complaint  Patient presents with  . Medical Management of Chronic Issues    malnutrition; chronic pain and vit b 12 def.     HPI:  He is an 83 year old long term resident of this facility being seen for the management of his chronic illnesses: malnutrition; chronic pain; vit b 12 deficiency  . He is unable to participate in the hpi or ros. There are no nursing concerns at this time.   Past Medical History:  Diagnosis Date  . Cancer [facility])    Prostate  . Dementia    Alzheimers  . GERD (gastroesophageal reflux disease)   . Heart murmur    systolic murmur no further eval d/t Alzheimers dx  . Hematuria   . Pulmonary nodule 08/17/2014  . Stroke [facility])    TIA    Past Surgical History:  Procedure Laterality Date  . BALLOON DILATION [name] 07/03/2014   Procedure: BALLOON DILATION;  Surgeon: Garlan Fair, MD;  [facility];  Service: Endoscopy;  Laterality: [name];  . BALLOON DILATION [name] 06/03/2015   Procedure: BALLOON DILATION;  Surgeon: Garlan Fair, MD;  [facility];  Service: Endoscopy;  Laterality: [name];  . CYSTOSCOPY [name] 11/07/2014   Procedure: CYSTOSCOPY;  Surgeon: Alexis Frock, MD;  [facility];  Service: Urology;  Laterality: [name];  . ESOPHAGOGASTRODUODENOSCOPY [name] 07/03/2014   Procedure: ESOPHAGOGASTRODUODENOSCOPY (EGD);  Surgeon: Garlan Fair, MD;  Location[facility];  Service: Endoscopy;  Laterality: [name];  . ESOPHAGOGASTRODUODENOSCOPY (EGD) WITH PROPOFOL [name] 06/03/2015   Procedure: ESOPHAGOGASTRODUODENOSCOPY (EGD) WITH PROPOFOL;  Surgeon: Garlan Fair, MD;  [facility];  Service: Endoscopy;  Laterality: [name];  . HERNIA REPAIR  1970   double  . seed implant for prostate cancer  2006  . TRANSURETHRAL RESECTION OF PROSTATE [name] 11/07/2014   Procedure: TRANSURETHRAL VAPORIZATION OF THE PROSTATE AND  BLADDER LESIONS WITH ERBE (NEW GYRUS) REMOVAL OF BLADDER STONE; PROSTATIC URETHRA BIOPSY;  Surgeon: Alexis Frock, MD;  [facility];  Service: Urology;  Laterality: [name];    Social History   Social History  . Marital status: Married    Spouse name: [name]  . Number of children: [name]  . Years of education: [name]   Occupational History  . Not on file.   Social History Main Topics  . Smoking status: Former Smoker    Packs/day: 1.00    Years: 10.00    Types: Cigarettes    Quit date: 08/31/1968  . Smokeless tobacco: Never Used  . Alcohol use No  . Drug use: No  . Sexual activity: No   Other Topics Concern  . Not on file   Social History Narrative  . No narrative on file   Family History  Problem Relation Age of Onset  . Heart disease Mother       VITAL SIGNS BP (!) 97/47   Pulse (!) 59   Temp 98 F (36.7 C)   Resp 16   Ht 5\' 6"  (1.676 m)   Wt 127 lb 8 oz (57.8 kg)   SpO2 96%   BMI 20.58 kg/m   Patient's Medications  New Prescriptions   No medications on file  Previous Medications   ACETAMINOPHEN (TYLENOL) 500 MG TABLET    Take 500 mg by mouth every 6 (six) hours as needed for moderate pain.  ALPRAZOLAM (XANAX) 0.25 MG TABLET    Take 0.25 mg by mouth 2 (two) times daily. Give at 6am and 6pm   AMINO ACIDS-PROTEIN HYDROLYS (FEEDING SUPPLEMENT, PRO-STAT SUGAR FREE 64,) LIQD    Take 30 mLs by mouth every evening.    ASPIRIN EC 81 MG TABLET    Take 81 mg by mouth daily.   CALCIUM-VITAMIN D (OSCAL WITH D) 500-200 MG-UNIT TABLET    Take 1 tablet by mouth 2 (two) times daily.   CITALOPRAM (CELEXA) 20 MG TABLET    Take 20 mg by mouth at bedtime.   DIVALPROEX (DEPAKOTE SPRINKLE) 125 MG CAPSULE    Take 125 mg by mouth 3 (three) times daily.    NUTRITIONAL SUPPLEMENT LIQD    House 2.0 - Med Pass Give 120 cc by mouth two times daily for weight decline   OMEPRAZOLE (PRILOSEC) 20 MG CAPSULE    Take 20 mg by mouth daily before breakfast.    ONDANSETRON 4 MG FILM    Take 4 mg  by mouth every 8 (eight) hours as needed (nausea and vomiting).    OXYGEN    2L/min via nasal cannula as needed for O2 sat less than or equal to 90%  Modified Medications   No medications on file  Discontinued Medications   COENZYME Q10 (CO Q 10) 100 MG CAPS    Take 1 capsule by mouth daily with breakfast.    MULTIPLE VITAMINS-MINERALS (DECUBI-VITE) CAPS    Take by mouth. Take 1 Capsule by mouth daily for pressure areas     SIGNIFICANT DIAGNOSTIC EXAMS  PREVIOUS  10-18-16: ct of head and cervical spine: 1. No evidence of significant acute traumatic injury to the skull, brain or cervical spine. 2. Mild cerebral atrophy with extensive chronic microvascular ischemic changes in the cerebral white matter redemonstrated. 3. Multilevel degenerative disc disease and cervical spondylosis, as above.  11-01-16: chest x-ray: Right lower lobe infiltrate.  03-09-17: chest x-ray: no hyperexpansion and clear lungs   NO NEW EXAMS    LABS REVIEWED: PREVIOUS  11-01-16: wbc 12.6; hgb 15.9; hct 48.5; mcv 89.5; plt 437; glucose 122; bun 40; creat 1.28; k+ 3.8; na++ 155; blood culture no growth; urine culture: no growth; HIV: nr; tsh 1.980 11-02-16: wbc 9.2; hgb 15.4; hct 46.5; mcv 89.4; plt 338; glucose 124; bun 32; creat 1.25; k+ 3.4; na++ 154; ast 48; total bili 1.7; albumin 2.8 11-03-16: wbc 7.1; hgb 13.1; hct 41.3; mcv 90.4; plt 288; glucose 99; bun 19; creat 1.18; k+ 3.0; na++ 146 mag 2.0; ammonia 49; vit B 12: 2265 11-04-16: wbc 9.1; hgb 12.7; hct 39.2; mcv 87.3; plt 294; glucose 93; bun 15; creat 0.82; k+ 3.8; na++ 140; ammonia 31 11-10-16: glucose 86; bun 13.3; creat 0.69; k+ 4.6; na++ 141; liver normal albumin 3.0  03-10-17: wbc 6.5; hgb 14.6; hct 44.6; mcv 92.5; plt 192;glucose 90; bun 15.0; creat 0.70; k+ 4.3; na++ 145; ca 91. 03-18-17: depakote: 12   NO NEW LABS   Review of Systems  Unable to perform ROS: Dementia (nonverbal )   Physical Exam  Constitutional: No distress.  Eyes: Conjunctivae are  normal.  Neck: Neck supple. No JVD present. No thyromegaly present.  Bilateral carotid bruit   Cardiovascular: Normal rate, regular rhythm and intact distal pulses.   Murmur heard. Respiratory: Effort normal and breath sounds normal. No respiratory distress. He has no wheezes.  GI: Soft. Bowel sounds are normal. He exhibits no distension. There is no tenderness.  Musculoskeletal: He exhibits  no edema.  Able to move all extremities  Is presently laying in fetal position   Lymphadenopathy:    He has no cervical adenopathy.  Neurological:  Aware   Skin: Skin is warm and dry. He is not diaphoretic.  Psychiatric: He has a normal mood and affect.   ASSESSMENT/ PLAN:  TODAY:   1. Protein calorie malnutrition: no significant change in status:  weight is 127  pounds; albumin 3.0; will continue prostat  30 cc prior to supper   2. Chronic pain: no indications of pain present. Will continue tylenol 500 mg every 6 hours as needed and will monitor   3. Vit B deficiency: vit B 12: 2655;is stable  will monitor    PREVIOUS:     4.Depression with anxiety: is stable  is presently on celexa 20 mg daily. Will continue depakote 125 mg three times daily to help stabilize mood and will continue xanax 0.25 mg twice daily   5. Gerd: has history of balloon dilatation: is stable  will continue prilosec 20 mg daily  6.  Dementia: no significant change in his status: his weight is 129  Pounds: is currently off medications; will monitor .   MD is aware of resident's narcotic use and is in agreement with current plan of care. We will attempt to wean resident as apropriate     Ok Edwards NP [facility]  Contact [phone] Monday through Friday 8am- 5pm  After hours call [phone]

## 2017-05-07 ENCOUNTER — Other Ambulatory Visit: Payer: Self-pay

## 2017-05-07 MED ORDER — ALPRAZOLAM 0.25 MG PO TABS: 0.2500 mg | ORAL_TABLET | Freq: Two times a day (BID) | ORAL | 0 refills | Status: DC

## 2017-05-07 NOTE — Telephone Encounter (Signed)
RX faxed to [facility] @ [phone], phone number [phone] 

## 2017-05-11 DIAGNOSIS — Z5181 Encounter for therapeutic drug level monitoring: Secondary | ICD-10-CM | POA: Diagnosis not present

## 2017-05-11 DIAGNOSIS — Z79899 Other long term (current) drug therapy: Secondary | ICD-10-CM | POA: Diagnosis not present

## 2017-05-11 DIAGNOSIS — A0472 Enterocolitis due to Clostridium difficile, not specified as recurrent: Secondary | ICD-10-CM | POA: Diagnosis not present

## 2017-05-13 ENCOUNTER — Emergency Department (HOSPITAL_COMMUNITY): Payer: Medicare Other

## 2017-05-13 ENCOUNTER — Encounter (HOSPITAL_COMMUNITY): Payer: Self-pay | Admitting: Internal Medicine

## 2017-05-13 ENCOUNTER — Emergency Department (HOSPITAL_COMMUNITY)
Admission: EM | Admit: 2017-05-13 | Discharge: 2017-05-13 | Disposition: A | Discharge status: Home / Self Care | Payer: Medicare Other | Attending: Emergency Medicine | Admitting: Emergency Medicine

## 2017-05-13 DIAGNOSIS — Z7982 Long term (current) use of aspirin: Secondary | ICD-10-CM | POA: Diagnosis not present

## 2017-05-13 DIAGNOSIS — R0902 Hypoxemia: Secondary | ICD-10-CM | POA: Diagnosis not present

## 2017-05-13 DIAGNOSIS — G309 Alzheimer's disease, unspecified: Secondary | ICD-10-CM | POA: Diagnosis not present

## 2017-05-13 DIAGNOSIS — J690 Pneumonitis due to inhalation of food and vomit: Secondary | ICD-10-CM

## 2017-05-13 DIAGNOSIS — Z8546 Personal history of malignant neoplasm of prostate: Secondary | ICD-10-CM | POA: Insufficient documentation

## 2017-05-13 DIAGNOSIS — R1111 Vomiting without nausea: Secondary | ICD-10-CM | POA: Diagnosis not present

## 2017-05-13 DIAGNOSIS — Z87891 Personal history of nicotine dependence: Secondary | ICD-10-CM | POA: Diagnosis not present

## 2017-05-13 DIAGNOSIS — R1110 Vomiting, unspecified: Secondary | ICD-10-CM | POA: Diagnosis not present

## 2017-05-13 DIAGNOSIS — Z79899 Other long term (current) drug therapy: Secondary | ICD-10-CM | POA: Insufficient documentation

## 2017-05-13 DIAGNOSIS — R4182 Altered mental status, unspecified: Secondary | ICD-10-CM | POA: Diagnosis not present

## 2017-05-13 DIAGNOSIS — R0602 Shortness of breath: Secondary | ICD-10-CM | POA: Diagnosis not present

## 2017-05-13 LAB — COMPREHENSIVE METABOLIC PANEL
ALBUMIN: 3 g/dL — AB (ref 3.5–5.0)
ALK PHOS: 69 U/L (ref 38–126)
ALT: 21 U/L (ref 17–63)
AST: 20 U/L (ref 15–41)
Anion gap: 7 (ref 5–15)
BUN: 23 mg/dL — ABNORMAL HIGH (ref 6–20)
CHLORIDE: 111 mmol/L (ref 101–111)
CO2: 26 mmol/L (ref 22–32)
Calcium: 9.2 mg/dL (ref 8.9–10.3)
Creatinine, Ser: 0.86 mg/dL (ref 0.61–1.24)
GFR calc non Af Amer: >60 mL/min (ref >60)
Glucose, Bld: 152 mg/dL — ABNORMAL HIGH (ref 65–99)
Potassium: 3.7 mmol/L (ref 3.5–5.1)
SODIUM: 144 mmol/L (ref 135–145)
Total Bilirubin: 0.6 mg/dL (ref 0.3–1.2)
Total Protein: 6.2 g/dL — ABNORMAL LOW (ref 6.5–8.1)

## 2017-05-13 LAB — I-STAT CHEM 8, ED
BUN: 24 mg/dL — AB (ref 6–20)
CHLORIDE: 110 mmol/L (ref 101–111)
Calcium, Ion: 1.15 mmol/L (ref 1.15–1.40)
Creatinine, Ser: 0.8 mg/dL (ref 0.61–1.24)
Glucose, Bld: 149 mg/dL — ABNORMAL HIGH (ref 65–99)
HCT: 45 % (ref 39.0–52.0)
Hemoglobin: 15.3 g/dL (ref 13.0–17.0)
POTASSIUM: 3.7 mmol/L (ref 3.5–5.1)
SODIUM: 147 mmol/L — AB (ref 135–145)
TCO2: 26 mmol/L (ref 22–32)

## 2017-05-13 LAB — CBC
HCT: 47.4 % (ref 39.0–52.0)
HEMOGLOBIN: 15.6 g/dL (ref 13.0–17.0)
MCH: 30.5 pg (ref 26.0–34.0)
MCHC: 32.9 g/dL (ref 30.0–36.0)
MCV: 92.8 fL (ref 78.0–100.0)
Platelets: 263 10*3/uL (ref 150–400)
RBC: 5.11 MIL/uL (ref 4.22–5.81)
RDW: 14.1 % (ref 11.5–15.5)
WBC: 12.8 10*3/uL — ABNORMAL HIGH (ref 4.0–10.5)

## 2017-05-13 LAB — TYPE AND SCREEN
ABO/RH(D): A POS
Antibody Screen: NEGATIVE

## 2017-05-13 LAB — ABO/RH: ABO/RH(D): A POS

## 2017-05-13 LAB — LIPASE, BLOOD: LIPASE: 15 U/L (ref 11–51)

## 2017-05-13 MED ORDER — SODIUM CHLORIDE 0.9 % IV BOLUS (SEPSIS)
1000.0000 mL | Freq: Once | INTRAVENOUS | Status: AC
  Administered 2017-05-13: 1000 mL via INTRAVENOUS

## 2017-05-13 MED ORDER — AMOXICILLIN-POT CLAVULANATE 875-125 MG PO TABS: 1.0000 | ORAL_TABLET | Freq: Two times a day (BID) | ORAL | 0 refills | Status: AC

## 2017-05-13 MED ORDER — SODIUM CHLORIDE 0.9 % IV SOLN
1.5000 g | Freq: Once | INTRAVENOUS | Status: AC
  Administered 2017-05-13: 1.5 g via INTRAVENOUS
  Filled 2017-05-13: qty 1.5

## 2017-05-13 MED ORDER — FENTANYL CITRATE (PF) 100 MCG/2ML IJ SOLN
50.0000 ug | Freq: Once | INTRAMUSCULAR | Status: AC
  Administered 2017-05-13: 50 ug via INTRAVENOUS
  Filled 2017-05-13: qty 2

## 2017-05-13 MED ORDER — ONDANSETRON HCL 4 MG/2ML IJ SOLN
4.0000 mg | Freq: Once | INTRAMUSCULAR | Status: AC
  Administered 2017-05-13: 4 mg via INTRAVENOUS

## 2017-05-13 MED ORDER — ONDANSETRON HCL 4 MG/2ML IJ SOLN
4.0000 mg | Freq: Once | INTRAMUSCULAR | Status: AC | PRN
  Administered 2017-05-13: 4 mg via INTRAVENOUS
  Filled 2017-05-13 (×2): qty 2

## 2017-05-13 MED ORDER — AMOXICILLIN-POT CLAVULANATE 875-125 MG PO TABS: 1.0000 | ORAL_TABLET | Freq: Once | ORAL | Status: DC

## 2017-05-13 NOTE — ED Notes (Signed)
Per Tyrone Nine keep pt on 6 lpm [place]; pt looks comfortable. Also verbal to give total of 8 mg of zofran IV.

## 2017-05-13 NOTE — ED Provider Notes (Signed)
[facility] DEPT Provider Note   CSN: [id number] Arrival date & time: 05/13/17  0277     History   Chief Complaint Chief Complaint  Patient presents with  . Emesis   Level V caveat due to dementia HPI [name] is a 83 y.o. male with history of prostate cancer, vascular dementia, GERD, and pulmonary nodule who presents today with chief complaint hematemesis. He was brought in by nursing home [facility]. Per nursing, patient was found with coffee-ground emesis "all over him ", approximately 15 minute after his morning medications were given. He then developed projectile vomiting. They state that until this point, patient was tolerating food and drink without difficulty. They state he is at his mental status baseline, which is a GCS 9. He is nonverbal and does not answer questions. Spoke with the patient's sister (who is his POA) who states that the patient "when he was in his right mind "did not want any extraneous measures and only comfort care.  The history is provided by the patient.    Past Medical History:  Diagnosis Date  . Cancer [facility])    Prostate  . Dementia    Alzheimers  . GERD (gastroesophageal reflux disease)   . Heart murmur    systolic murmur no further eval d/t Alzheimers dx  . Hematuria   . Pulmonary nodule 08/17/2014  . Stroke [facility])    TIA    Patient Active Problem List   Diagnosis Date Noted  . Vascular dementia with behavioral disturbance 03/23/2017  . Fall at nursing home 03/23/2017  . Vitamin B 12 deficiency 11/06/2016  . Chronic pain 11/06/2016  . GERD without esophagitis 11/06/2016  . Depression with anxiety 11/06/2016  . Protein-calorie malnutrition, severe ([place]) 08/18/2014    Past Surgical History:  Procedure Laterality Date  . BALLOON DILATION [name] 07/03/2014   Procedure: BALLOON DILATION;  Surgeon: Garlan Fair, MD;  Location: [facility];  Service: Endoscopy;  Laterality: [name];  . BALLOON DILATION [name] 06/03/2015   Procedure:  BALLOON DILATION;  Surgeon: Garlan Fair, MD;  Location: [facility] ENDOSCOPY;  Service: Endoscopy;  Laterality: [name];  . CYSTOSCOPY [name] 11/07/2014   Procedure: CYSTOSCOPY;  Surgeon: Alexis Frock, MD;  Location: [facility] ORS;  Service: Urology;  Laterality: [name];  . ESOPHAGOGASTRODUODENOSCOPY [name] 07/03/2014   Procedure: ESOPHAGOGASTRODUODENOSCOPY (EGD);  Surgeon: Garlan Fair, MD;  Location: [facility];  Service: Endoscopy;  Laterality: [name];  . ESOPHAGOGASTRODUODENOSCOPY (EGD) WITH PROPOFOL [name] 06/03/2015   Procedure: ESOPHAGOGASTRODUODENOSCOPY (EGD) WITH PROPOFOL;  Surgeon: Garlan Fair, MD;  Location: [facility] ENDOS[facility];  Service: Endoscopy;  Laterality: [name];  . HERNIA REPAIR  1970   double  . seed implant for prostate cancer  2006  . TRANSURETHRAL RESECTION OF PROSTATE [name] 11/07/2014   Procedure: TRANSURETHRAL VAPORIZATION OF THE PROSTATE AND BLADDER LESIONS WITH ERBE (NEW GYRUS) REMOVAL OF BLADDER STONE; PROSTATIC URETHRA BIOPSY;  Surgeon: Alexis Frock, MD;  Location: [facility] ORS;  Service: Urology;  Laterality: [name];       Home Medications    Prior to Admission medications   Medication Sig Start Date End Date Taking? Authorizing Provider  acetaminophen (TYLENOL) 500 MG tablet Take 500 mg by mouth every 6 (six) hours as needed for moderate pain.    Yes [provider]  ALPRAZolam (XANAX) 0.25 MG tablet Take 1 tablet (0.25 mg total) by mouth 2 (two) times daily. Give at 6am and 6pm 05/07/17  Yes Gerlene Fee, NP  Amino Acids-Protein Hydrolys (FEEDING SUPPLEMENT, PRO-STAT SUGAR FREE 64,)  LIQD Take 30 mLs by mouth every evening.    Yes [provider]  aspirin 81 MG tablet Take 81 mg by mouth daily with breakfast.   Yes [provider]  calcium-vitamin D (OSCAL WITH D) 500-200 MG-UNIT tablet Take 1 tablet by mouth 2 (two) times daily.   Yes [provider]  citalopram (CELEXA) 20 MG tablet Take 20 mg by mouth at bedtime.   Yes [provider]  divalproex  (DEPAKOTE SPRINKLE) 125 MG capsule Take 125 mg by mouth 3 (three) times daily.    Yes [provider]  loperamide (IMODIUM A-D) 2 MG tablet Take 2 mg by mouth every 2 (two) hours as needed for diarrhea or loose stools.   Yes [provider]  NUTRITIONAL SUPPLEMENT LIQD Take 120 mLs by mouth 2 (two) times daily. House 2.0 - [facility]   Yes [provider]  omeprazole (PRILOSEC OTC) 20 MG tablet Take 20 mg by mouth daily with breakfast.   Yes [provider]  Ondansetron 4 MG FILM Take 4 mg by mouth every 8 (eight) hours as needed (nausea and vomiting).    Yes [provider]  OXYGEN Place 2 L/min into the nose as needed (for shortness of breath). 2L/min via nasal cannula as needed for O2 sat less than or equal to 90%    Yes [provider]  amoxicillin-clavulanate (AUGMENTIN) 875-125 MG tablet Take 1 tablet by mouth every 12 (twelve) hours. 05/13/17 05/23/17  Renita Papa, PA-C    Family History Family History  Problem Relation Age of Onset  . Heart disease Mother     Social History Social History  Substance Use Topics  . Smoking status: Former Smoker    Packs/day: 1.00    Years: 10.00    Types: Cigarettes    Quit date: 08/31/1968  . Smokeless tobacco: Never Used  . Alcohol use No     Allergies   Patient has no known allergies.   Review of Systems Review of Systems  Unable to perform ROS: Dementia     Physical Exam Updated Vital Signs BP 133/69   Pulse 84   Temp 98.1 F (36.7 C)   Resp 16   Ht 5\' 3"  (1.6 m)   Wt 57.6 kg (127 lb)   SpO2 99%   BMI 22.50 kg/m   Physical Exam  Constitutional: He appears well-developed and well-nourished. No distress.  Resting on his back leaning towards the left, does not make eye contact  HENT:  Head: Normocephalic and atraumatic.  Eyes: Conjunctivae are normal. Right eye exhibits no discharge. Left eye exhibits no discharge.  Neck: No JVD present. No tracheal deviation present.    Cardiovascular: Normal rate and regular rhythm.   Pulmonary/Chest: Effort normal. No respiratory distress. He has no wheezes. He has no rales. He exhibits no tenderness.  Abdominal: Soft. Bowel sounds are normal. He exhibits no distension. There is no tenderness. There is no guarding.  Occasionally belches  Musculoskeletal: He exhibits no edema.  Patient keeps himself in a generally fetal position leaning towards the left. He will not cooperate for straightening his legs or arms. No obvious deformity, swelling, or crepitus on palpation.   Neurological: He is alert. GCS eye subscore is 3. GCS verbal subscore is 1. GCS motor subscore is 5.  Does not make eye contact, no verbal response, occasionally moves extremities spontaneously.  Skin: Skin is warm and dry. No erythema.  Psychiatric: He has a normal mood and affect.  His behavior is normal.  Nursing note and vitals reviewed.    ED Treatments / Results  Labs (all labs ordered are listed, but only abnormal results are displayed) Labs Reviewed  COMPREHENSIVE METABOLIC PANEL - Abnormal; Notable for the following:       Result Value   Glucose, Bld 152 (*)    BUN 23 (*)    Total Protein 6.2 (*)    Albumin 3.0 (*)    All other components within normal limits  CBC - Abnormal; Notable for the following:    WBC 12.8 (*)    All other components within normal limits  I-STAT CHEM 8, ED - Abnormal; Notable for the following:    Sodium 147 (*)    BUN 24 (*)    Glucose, Bld 149 (*)    All other components within normal limits  LIPASE, BLOOD  TYPE AND SCREEN  ABO/RH    EKG  EKG Interpretation None       Radiology Dg Chest Port 1 View  Result Date: 05/13/2017 CLINICAL DATA:  Shortness of breath, vomiting EXAM: PORTABLE CHEST 1 VIEW COMPARISON:  11/01/2016 FINDINGS: Patchy opacity in the left lung base. Right lung is clear. No effusions or pneumothorax. Heart is normal size. Aortic atherosclerosis. IMPRESSION: Patchy opacity at the  left base, atelectasis versus infiltrate. Electronically Signed   By: Rolm Baptise M.D.   On: 05/13/2017 10:32    Procedures Procedures (including critical care time)  Medications Ordered in ED Medications  sodium chloride 0.9 % bolus 1,000 mL (not administered)  ondansetron (ZOFRAN) injection 4 mg (4 mg Intravenous Given 05/13/17 1018)  ondansetron (ZOFRAN) injection 4 mg (4 mg Intravenous Given 05/13/17 1018)  fentaNYL (SUBLIMAZE) injection 50 mcg (50 mcg Intravenous Given 05/13/17 1117)  ampicillin-sulbactam (UNASYN) 1.5 g in sodium chloride 0.9 % 50 mL IVPB (0 g Intravenous Stopped 05/13/17 1400)     Initial Impression / Assessment and Plan / ED Course  I have reviewed the triage vital signs and the nursing notes.  Pertinent labs & imaging results that were available during my care of the patient were reviewed by me and considered in my medical decision making (see chart for details).     Patient with dementia presents from assisted living facility with concern for coffee-ground emesis. He is DNR and only wants comfort measures. He is at his mental status baseline. Afebrile, but slightly hypotensive and hypoxic on initial presentation. SPO2 88% on 6 L/m via nasal cannula initially with improvement after repositioning to 99% on 4 L/m nasal cannula. He did not have oxygen requirements prior to arrival. Chest x-ray shows a patchy opacity at the left base suggesting aspiration pneumonia after emesis this morning. He is not significantly anemic and does not require blood transfusion. No significant abnormalities or changes to his CMP from last. Remainder of lab work is reassuring. Unasyn given in the ED for aspiration pneumonia. Oxygen ordered for discharge to have for comfort on return to assisted living facility. On reevaluation, patient's breathing is stable and he is in no apparent distress. He has not vomited once while in the ED and airway appears patent with no compromise. Spoke with  patient's sister and power of attorney Army Chaco who states that she would like palliative care consult for further resources. Case manager Levada Dy made aware, and she will be in contact with patient's sister for follow-up. Patient is stable for discharge back to the palliative care facility with a course of Augmentin and oxygen when necessary.  Patient seen and evaluated by Dr. Tyrone Nine who agrees with assessment and plan at this time. Final Clinical Impressions(s) / ED Diagnoses   Final diagnoses:  Aspiration pneumonia of left lower lobe due to vomit [facility])    New Prescriptions New Prescriptions   AMOXICILLIN-CLAVULANATE (AUGMENTIN) 875-125 MG TABLET    Take 1 tablet by mouth every 12 (twelve) hours.     Renita Papa, PA-C 05/13/17 [place], [place], DO 05/13/17 1559

## 2017-05-13 NOTE — ED Notes (Signed)
Per PA pt oxygenation improved. Placed on 4L nasal cannula. PA at bedside.

## 2017-05-13 NOTE — ED Notes (Signed)
Portable DG at bedside. Will administer medications post completion.

## 2017-05-13 NOTE — ED Notes (Signed)
Spoke with nursing home. Pt does not use oxygen at home. Pt usually has O2 sats around 96 or 97% on room air. Pt was given meds this morning by nurse, then was found 15 minutes later covered in emesis. Pt continued to projectile vomit "coffee ground" emesis.

## 2017-05-13 NOTE — ED Triage Notes (Signed)
Per EMS pt from [facility] with coffee ground vomiting noted this morning. Pt alert per normal; hx of dementia.

## 2017-05-13 NOTE — Care Management Note (Signed)
Case Management Note  Patient Details  Name: [name] MRN: [id number] Date of Birth: 1926-07-12  Subjective/Objective:  83 y.o. male with history of prostate cancer, vascular dementia, GERD, and pulmonary nodule who presents today with chief complaint hematemesis. ED dx as aspiration pneumonia.                Action/Plan: CM was advised pt was comfort care but it was unclear if pt was active with any Hospice/Palliative agencies.  Additionally CM noted that pt was to go back to the facility on O2 which is not baseline.  Spoke with pt's RN, stephanie and the SNF liaison RN, Cleon Dew, who both confirmed that it should be ok for the pt to return without a hospital admission for the acute O2 DME need for insurance approval.  They stated the facility has their own O2 concentrators.  Pt's sister/PO, [name], stated to [place], [place] that they would like [facility] to come in for services in the facility.  Notified Audrea Muscat of pt's sister's request.  No further CM needs noted at this time.  Expected Discharge Date:   05/13/2017               Expected Discharge Plan:  [facility]  In-House Referral:  Clinical Social Work  Discharge planning Services  CM Consult  Post Acute Care Choice:  Durable Medical Equipment Choice offered to:   (POA and sister)  DME Arranged:  Oxygen  Status of Service:  Completed, signed off  Corky Crafts, RN 05/13/2017, 1:05 PM

## 2017-05-13 NOTE — ED Notes (Signed)
Bed: WA20 Expected date:  Expected time:  Means of arrival:  Comments: EMS- 83yo M, vomiting/GI Bleed

## 2017-05-13 NOTE — NC FL2 (Signed)
[place] LEVEL OF CARE SCREENING TOOL     IDENTIFICATION  Patient Name: [name] Birthdate: [birth year] Sex: male Admission Date (Current Location): 05/13/2017  [facility] and [place] Number:  Herbalist and Address:  [facility],  [address], [place]      Provider Number: [id number]  Attending Physician Name and Address:  Deno Etienne, DO  Relative Name and Phone Number:       Current Level of Care: Hospital Recommended Level of Care: [place] Prior Approval Number:    Date Approved/Denied:   PASRR Number: [id number]  Discharge Plan: SNF    Current Diagnoses: Patient Active Problem List   Diagnosis Date Noted  . Vascular dementia with behavioral disturbance 03/23/2017  . Fall at nursing home 03/23/2017  . Vitamin B 12 deficiency 11/06/2016  . Chronic pain 11/06/2016  . GERD without esophagitis 11/06/2016  . Depression with anxiety 11/06/2016  . Protein-calorie malnutrition, severe ([place]) 08/18/2014    Orientation RESPIRATION BLADDER Height & Weight     Self  O2 Incontinent Weight: 127 lb (57.6 kg) Height:  5\' 3"  (160 cm)  BEHAVIORAL SYMPTOMS/MOOD NEUROLOGICAL BOWEL NUTRITION STATUS      Incontinent Diet  AMBULATORY STATUS COMMUNICATION OF NEEDS Skin   Limited Assist   Normal                       Personal Care Assistance Level of Assistance  Bathing, Feeding, Dressing Bathing Assistance: Limited assistance Feeding assistance: Independent Dressing Assistance: Limited assistance     Functional Limitations Info             SPECIAL CARE FACTORS FREQUENCY  PT (By licensed PT), OT (By licensed OT)     PT Frequency: 5 OT Frequency: 5            Contractures      Additional Factors Info  Code Status, Allergies Code Status Info: DNR Allergies Info: NKA            Current Medications (05/13/2017):  This is the current hospital active medication list No current  facility-administered medications for this encounter.    Current Outpatient Prescriptions  Medication Sig Dispense Refill  . acetaminophen (TYLENOL) 500 MG tablet Take 500 mg by mouth every 6 (six) hours as needed for moderate pain.     [name] ALPRAZolam (XANAX) 0.25 MG tablet Take 1 tablet (0.25 mg total) by mouth 2 (two) times daily. Give at 6am and 6pm 60 tablet 0  . Amino Acids-Protein Hydrolys (FEEDING SUPPLEMENT, PRO-STAT SUGAR FREE 64,) LIQD Take 30 mLs by mouth every evening.     [name] aspirin 81 MG tablet Take 81 mg by mouth daily with breakfast.    . calcium-vitamin D (OSCAL WITH D) 500-200 MG-UNIT tablet Take 1 tablet by mouth 2 (two) times daily.    . citalopram (CELEXA) 20 MG tablet Take 20 mg by mouth at bedtime.    . divalproex (DEPAKOTE SPRINKLE) 125 MG capsule Take 125 mg by mouth 3 (three) times daily.     [name] loperamide (IMODIUM A-D) 2 MG tablet Take 2 mg by mouth every 2 (two) hours as needed for diarrhea or loose stools.    . NUTRITIONAL SUPPLEMENT LIQD Take 120 mLs by mouth 2 (two) times daily. House 2.0 - Med [facility]    . omeprazole (PRILOSEC OTC) 20 MG tablet Take 20 mg by mouth daily with breakfast.    . Ondansetron 4  MG FILM Take 4 mg by mouth every 8 (eight) hours as needed (nausea and vomiting).     . OXYGEN Place 2 L/min into the nose as needed (for shortness of breath). 2L/min via nasal cannula as needed for O2 sat less than or equal to 90%        Discharge Medications: Please see discharge summary for a list of discharge medications.  Relevant Imaging Results:  Relevant Lab Results:   Additional Information ssn#[id number]  Weston Anna, LCSW

## 2017-05-13 NOTE — Discharge Instructions (Signed)
Please take all of your antibiotics until finished!   You may develop abdominal discomfort or diarrhea from the antibiotic.  You may help offset this with probiotics which you can buy or get in yogurt. Do not eat  or take the probiotics until 2 hours after your antibiotic. Oxygen through nasal cannula as needed. Follow up with a primary care physician in one week for reevaluation and repeat chest x-ray. Return to the ED immediately if any concerning signs or symptoms develop.

## 2017-05-13 NOTE — ED Notes (Signed)
Pt no longer dry heaving. Pt repositioned and appears to be comfortable.

## 2017-05-13 NOTE — Progress Notes (Signed)
Palliative Medicine RN Note: Due to high consult volume and pending weather concerns, there will be a delay in seeing the pt, possibly as long as several days. If the attending team feels it it appropriate and if the patient is d/c before we can see him, PMT recommends including in the d/c orders/notes a referral to outpatient palliative care or hospice as appropriate for facility to initiate upon his return.   Marjie Skiff Stephonie Wilcoxen, RN, BSN, [facility] 05/13/2017 1:37 PM Cell [phone] 8:00-4:00 Monday-Friday Office [phone]

## 2017-05-13 NOTE — Progress Notes (Signed)
CSW received consult for patient needing O2 to return back to [facility]. Patient will need orders and CSW will complete updated Fl2.   Kingsley Spittle, [facility] Emergency Room Clinical Social Worker [phone]

## 2017-05-14 ENCOUNTER — Non-Acute Institutional Stay (SKILLED_NURSING_FACILITY): Payer: Medicare Other | Admitting: Adult Health

## 2017-05-14 ENCOUNTER — Encounter: Payer: Self-pay | Admitting: Adult Health

## 2017-05-14 DIAGNOSIS — J189 Pneumonia, unspecified organism: Secondary | ICD-10-CM

## 2017-05-14 DIAGNOSIS — R627 Adult failure to thrive: Secondary | ICD-10-CM | POA: Diagnosis not present

## 2017-05-14 DIAGNOSIS — F01518 Vascular dementia, unspecified severity, with other behavioral disturbance: Secondary | ICD-10-CM

## 2017-05-14 DIAGNOSIS — F0151 Vascular dementia with behavioral disturbance: Secondary | ICD-10-CM

## 2017-05-14 DIAGNOSIS — J181 Lobar pneumonia, unspecified organism: Secondary | ICD-10-CM

## 2017-05-14 NOTE — Progress Notes (Signed)
Location:    [facility] Home Room Number: 102 A Place of Service:  SNF (31)   CODE STATUS: dnr  No Known Allergies  Chief Complaint  Patient presents with  . Acute Visit    ER Follow up - LLL Pneumonia    HPI:  Yesterday morning he was found to have significant coffee ground emesis. He was then sent to the ED for further evaluation and treatment options. He is comfort care and his family desired for him to return back to this facility. He was given zofran for his nausea and was found to have pneumonia in the left lower lung thought to be aspiration. He is on 2 L/[place]. He is unable to participate in the hpi or ros; but he did tell me"hello". There are no reports of further vomiting; fevers; or signs of pain. There are no nursing concerns this AM.   Past Medical History:  Diagnosis Date  . Cancer [facility])    Prostate  . Dementia    Alzheimers  . GERD (gastroesophageal reflux disease)   . Heart murmur    systolic murmur no further eval d/t Alzheimers dx  . Hematuria   . Pulmonary nodule 08/17/2014  . Stroke [facility])    TIA    Past Surgical History:  Procedure Laterality Date  . BALLOON DILATION [name] 07/03/2014   Procedure: BALLOON DILATION;  Surgeon: Garlan Fair, MD;  Location: [facility];  Service: Endoscopy;  Laterality: [name];  . BALLOON DILATION [name] 06/03/2015   Procedure: BALLOON DILATION;  Surgeon: Garlan Fair, MD;  [facility]: [facility];  Service: Endoscopy;  Laterality: [name];  . CYSTOSCOPY [name] 11/07/2014   Procedure: CYSTOSCOPY;  Surgeon: Alexis Frock, MD;  [facility]S;  Service: Urology;  Laterality: [name];  . ESOPHAGOGASTRODUODENOSCOPY [name] 07/03/2014   Procedure: ESOPHAGOGASTRODUODENOSCOPY (EGD);  Surgeon: Garlan Fair, MD;  Location: [facility];  Service: Endoscopy;  Laterality: [name];  . ESOPHAGOGASTRODUODENOSCOPY (EGD) WITH PROPOFOL [name] 06/03/2015   Procedure: ESOPHAGOGASTRODUODENOSCOPY (EGD) WITH PROPOFOL;  Surgeon: Garlan Fair, MD;  [facility]  ENDOS[facility];  Service: Endoscopy;  Laterality: [name];  . HERNIA REPAIR  1970   double  . seed implant for prostate cancer  2006  . TRANSURETHRAL RESECTION OF PROSTATE [name] 11/07/2014   Procedure: TRANSURETHRAL VAPORIZATION OF THE PROSTATE AND BLADDER LESIONS WITH ERBE (NEW GYRUS) REMOVAL OF BLADDER STONE; PROSTATIC URETHRA BIOPSY;  Surgeon: Alexis Frock, MD;  [facility]S;  Service: Urology;  Laterality: [name];    Social History   Social History  . Marital status: Married    Spouse name: [name]  . Number of children: [name]  . Years of education: [name]   Occupational History  . Not on file.   Social History Main Topics  . Smoking status: Former Smoker    Packs/day: 1.00    Years: 10.00    Types: Cigarettes    Quit date: 08/31/1968  . Smokeless tobacco: Never Used  . Alcohol use No  . Drug use: No  . Sexual activity: No   Other Topics Concern  . Not on file   Social History Narrative  . No narrative on file   Family History  Problem Relation Age of Onset  . Heart disease Mother       VITAL SIGNS BP 133/83   Pulse 64   Temp 98.8 F (37.1 C)   Resp 18   Ht 5\' 6"  (1.676 m)   Wt 125 lb 14.4 oz (57.1 kg)   SpO2 95% Comment: on  2 l/m  BMI 20.32 kg/m   Patient's Medications  New Prescriptions   No medications on file  Previous Medications   ACETAMINOPHEN (TYLENOL) 500 MG TABLET    Take 500 mg by mouth every 6 (six) hours as needed for moderate pain.    ALPRAZOLAM (XANAX) 0.25 MG TABLET    Take 1 tablet (0.25 mg total) by mouth 2 (two) times daily. Give at 6am and 6pm   AMINO ACIDS-PROTEIN HYDROLYS (FEEDING SUPPLEMENT, PRO-STAT SUGAR FREE 64,) LIQD    Take 30 mLs by mouth every evening.    AMOXICILLIN-CLAVULANATE (AUGMENTIN) 875-125 MG TABLET    Take 1 tablet by mouth every 12 (twelve) hours.   ASPIRIN 81 MG TABLET    Take 81 mg by mouth daily with breakfast.   CALCIUM-VITAMIN D (OSCAL WITH D) 500-200 MG-UNIT TABLET    Take 1 tablet by mouth 2 (two) times daily.    CITALOPRAM (CELEXA) 20 MG TABLET    Take 20 mg by mouth at bedtime.   DIVALPROEX (DEPAKOTE SPRINKLE) 125 MG CAPSULE    Take 125 mg by mouth 3 (three) times daily.    LOPERAMIDE (IMODIUM A-D) 2 MG TABLET    Take 2 mg by mouth every 2 (two) hours as needed for diarrhea or loose stools.   NUTRITIONAL SUPPLEMENT LIQD    Take 120 mLs by mouth 2 (two) times daily. House 2.0 - [facility]   OMEPRAZOLE (PRILOSEC OTC) 20 MG TABLET    Take 20 mg by mouth daily with breakfast.   ONDANSETRON 4 MG FILM    Take 4 mg by mouth every 8 (eight) hours as needed (nausea and vomiting).    OXYGEN    Place 2 L/min into the nose as needed (for shortness of breath). 2L/min via nasal cannula as needed for O2 sat less than or equal to 90%   Modified Medications   No medications on file  Discontinued Medications   No medications on file     SIGNIFICANT DIAGNOSTIC EXAMS  PREVIOUS  10-18-16: ct of head and cervical spine: 1. No evidence of significant acute traumatic injury to the skull, brain or cervical spine. 2. Mild cerebral atrophy with extensive chronic microvascular ischemic changes in the cerebral white matter redemonstrated. 3. Multilevel degenerative disc disease and cervical spondylosis, as above.  11-01-16: chest x-ray: Right lower lobe infiltrate.  03-09-17: chest x-ray: no hyperexpansion and clear lungs   TODAY:   05-13-17:  Patchy opacity at the left base, atelectasis versus infiltrate    LABS REVIEWED: PREVIOUS  11-01-16: wbc 12.6; hgb 15.9; hct 48.5; mcv 89.5; plt 437; glucose 122; bun 40; creat 1.28; k+ 3.8; na++ 155; blood culture no growth; urine culture: no growth; HIV: nr; tsh 1.980 11-02-16: wbc 9.2; hgb 15.4; hct 46.5; mcv 89.4; plt 338; glucose 124; bun 32; creat 1.25; k+ 3.4; na++ 154; ast 48; total bili 1.7; albumin 2.8 11-03-16: wbc 7.1; hgb 13.1; hct 41.3; mcv 90.4; plt 288; glucose 99; bun 19; creat 1.18; k+ 3.0; na++ 146 mag 2.0; ammonia 49; vit B 12: 2265 11-04-16: wbc 9.1; hgb 12.7; hct 39.2;  mcv 87.3; plt 294; glucose 93; bun 15; creat 0.82; k+ 3.8; na++ 140; ammonia 31 11-10-16: glucose 86; bun 13.3; creat 0.69; k+ 4.6; na++ 141; liver normal albumin 3.0  03-10-17: wbc 6.5; hgb 14.6; hct 44.6; mcv 92.5; plt 192;glucose 90; bun 15.0; creat 0.70; k+ 4.3; na++ 145; ca 91. 03-18-17: depakote: 12   TODAY:   05-13-17: wbc 12.8; hgb  15.6; hct 47.4; mcv 92.8; plt 263; glucose 152; bun 23; creat 0.86; k+ 3.7; na++ 144; ca 9.2; liver normal albumin 3.0; lipase 15.   Review of Systems  Unable to perform ROS: Dementia (unable to answer )   Physical Exam  Constitutional: No distress.  Eyes: Conjunctivae are normal.  Neck: Neck supple. No JVD present. No thyromegaly present.  Carotid bruit  Cardiovascular: Normal rate, regular rhythm and intact distal pulses.   Murmur heard. Respiratory: Effort normal. No respiratory distress. He has no wheezes. He has rales.  02 at 2L/[place]  GI: Soft. Bowel sounds are normal. He exhibits no distension. There is no tenderness.  Musculoskeletal: He exhibits no edema.  Is laying in fetal position Did not voluntarily move   Lymphadenopathy:    He has no cervical adenopathy.  Neurological:  Is aware   Skin: Skin is warm and dry. He is not diaphoretic.  Psychiatric: He has a normal mood and affect.     ASSESSMENT/ PLAN:  TODAY:   1. Dementia 2. Failure to thrive 3. Left lower lobe pneumonia  Will continue augmentin for total of 10 days Will continue 02 to maintain 02 sats >90 The goals of his care is comfort only   Will continue to monitor her status.      MD is aware of resident's narcotic use and is in agreement with current plan of care. We will attempt to wean resident as apropriate     Ok Edwards NP [facility]  Contact [phone] Monday through Friday 8am- 5pm  After hours call [phone]

## 2017-05-18 ENCOUNTER — Non-Acute Institutional Stay (SKILLED_NURSING_FACILITY): Payer: Medicare Other | Admitting: Adult Health

## 2017-05-18 ENCOUNTER — Encounter: Payer: Self-pay | Admitting: Adult Health

## 2017-05-18 DIAGNOSIS — F01518 Vascular dementia, unspecified severity, with other behavioral disturbance: Secondary | ICD-10-CM

## 2017-05-18 DIAGNOSIS — J189 Pneumonia, unspecified organism: Secondary | ICD-10-CM

## 2017-05-18 DIAGNOSIS — F0151 Vascular dementia with behavioral disturbance: Secondary | ICD-10-CM | POA: Diagnosis not present

## 2017-05-18 DIAGNOSIS — J181 Lobar pneumonia, unspecified organism: Secondary | ICD-10-CM | POA: Diagnosis not present

## 2017-05-18 DIAGNOSIS — R627 Adult failure to thrive: Secondary | ICD-10-CM | POA: Diagnosis not present

## 2017-05-18 NOTE — Progress Notes (Signed)
Location:   [place] Room Number: 102 A Place of Service:  SNF (31)   CODE STATUS: DNR  No Known Allergies  Chief Complaint  Patient presents with  . Acute Visit    Weight Loss    HPI:  He does have end stage dementia. Unfortunately weight loss is expected in the last stages of this disease. His weight in June was 128 pounds; his current weight is 122 pounds.  He has recently been treated for pneumonia. He has mottling present in his lower extremities. He will open eyes but no verbal response. I have had a prolonged discussion with his wife regarding his overall status. His status is poor. More than likely he will not see thanksgiving. His wife did verbalize understanding and desires for comfort only. She has agreed to hospice care.   Past Medical History:  Diagnosis Date  . Cancer [facility])    Prostate  . Dementia    Alzheimers  . GERD (gastroesophageal reflux disease)   . Heart murmur    systolic murmur no further eval d/t Alzheimers dx  . Hematuria   . Pulmonary nodule 08/17/2014  . Stroke [facility])    TIA    Past Surgical History:  Procedure Laterality Date  . BALLOON DILATION [name] 07/03/2014   Procedure: BALLOON DILATION;  Surgeon: Garlan Fair, MD;  Location: [facility];  Service: Endoscopy;  Laterality: [name];  . BALLOON DILATION [name] 06/03/2015   Procedure: BALLOON DILATION;  Surgeon: Garlan Fair, MD;  [facility];  Service: Endoscopy;  Laterality: [name];  . CYSTOSCOPY [name] 11/07/2014   Procedure: CYSTOSCOPY;  Surgeon: Alexis Frock, MD;  [facility];  Service: Urology;  Laterality: [name];  . ESOPHAGOGASTRODUODENOSCOPY [name] 07/03/2014   Procedure: ESOPHAGOGASTRODUODENOSCOPY (EGD);  Surgeon: Garlan Fair, MD;  Location: [facility];  Service: Endoscopy;  Laterality: [name];  . ESOPHAGOGASTRODUODENOSCOPY (EGD) WITH PROPOFOL [name] 06/03/2015   Procedure: ESOPHAGOGASTRODUODENOSCOPY (EGD) WITH PROPOFOL;  Surgeon: Garlan Fair, MD;  [facility];  Service: Endoscopy;  Laterality: [name];  . HERNIA REPAIR  1970   double  . seed implant for prostate cancer  2006  . TRANSURETHRAL RESECTION OF PROSTATE [name] 11/07/2014   Procedure: TRANSURETHRAL VAPORIZATION OF THE PROSTATE AND BLADDER LESIONS WITH ERBE (NEW GYRUS) REMOVAL OF BLADDER STONE; PROSTATIC URETHRA BIOPSY;  Surgeon: Alexis Frock, MD;  [facility];  Service: Urology;  Laterality: [name];    Social History   Social History  . Marital status: Married    Spouse name: [name]  . Number of children: [name]  . Years of education: [name]   Occupational History  . Not on file.   Social History Main Topics  . Smoking status: Former Smoker    Packs/day: 1.00    Years: 10.00    Types: Cigarettes    Quit date: 08/31/1968  . Smokeless tobacco: Never Used  . Alcohol use No  . Drug use: No  . Sexual activity: No   Other Topics Concern  . Not on file   Social History Narrative  . No narrative on file   Family History  Problem Relation Age of Onset  . Heart disease Mother       VITAL SIGNS BP 132/78   Pulse (!) 58   Temp 97.6 F (36.4 C)   Resp 16   Ht 5\' 6"  (1.676 m)   Wt 122 lb 11.2 oz (55.7 kg)   SpO2 (!) 89% Comment: 2l/m  BMI 19.80 kg/m   Patient's  Medications  New Prescriptions   No medications on file  Previous Medications   ACETAMINOPHEN (TYLENOL) 500 MG TABLET    Take 500 mg by mouth every 6 (six) hours as needed for moderate pain.    ALPRAZOLAM (XANAX) 0.25 MG TABLET    Take 1 tablet (0.25 mg total) by mouth 2 (two) times daily. Give at 6am and 6pm   AMINO ACIDS-PROTEIN HYDROLYS (FEEDING SUPPLEMENT, PRO-STAT SUGAR FREE 64,) LIQD    Take 30 mLs by mouth every evening.    AMOXICILLIN-CLAVULANATE (AUGMENTIN) 875-125 MG TABLET    Take 1 tablet by mouth every 12 (twelve) hours.   ASPIRIN 81 MG TABLET    Take 81 mg by mouth daily with breakfast.   CALCIUM-VITAMIN D (OSCAL WITH D) 500-200 MG-UNIT TABLET    Take 1 tablet by mouth 2 (two) times daily.    CITALOPRAM (CELEXA) 20 MG TABLET    Take 20 mg by mouth at bedtime.   DIVALPROEX (DEPAKOTE SPRINKLE) 125 MG CAPSULE    Take 125 mg by mouth 3 (three) times daily.    LOPERAMIDE (IMODIUM A-D) 2 MG TABLET    Take 2 mg by mouth every 2 (two) hours as needed for diarrhea or loose stools.   NUTRITIONAL SUPPLEMENT LIQD    Take 120 mLs by mouth 2 (two) times daily. House 2.0 - [facility]   OMEPRAZOLE (PRILOSEC OTC) 20 MG TABLET    Take 20 mg by mouth daily with breakfast.   ONDANSETRON 4 MG FILM    Take 4 mg by mouth every 8 (eight) hours as needed (nausea and vomiting).    OXYGEN    Place 2 L/min into the nose as needed (for shortness of breath). 2L/min via nasal cannula as needed for O2 sat less than or equal to 90%   Modified Medications   No medications on file  Discontinued Medications   No medications on file     SIGNIFICANT DIAGNOSTIC EXAMS  PREVIOUS  10-18-16: ct of head and cervical spine: 1. No evidence of significant acute traumatic injury to the skull, brain or cervical spine. 2. Mild cerebral atrophy with extensive chronic microvascular ischemic changes in the cerebral white matter redemonstrated. 3. Multilevel degenerative disc disease and cervical spondylosis, as above.  11-01-16: chest x-ray: Right lower lobe infiltrate.  03-09-17: chest x-ray: no hyperexpansion and clear lungs   05-13-17:  Patchy opacity at the left base, atelectasis versus infiltrate  NO NEW EXAMS     LABS REVIEWED: PREVIOUS  11-01-16: wbc 12.6; hgb 15.9; hct 48.5; mcv 89.5; plt 437; glucose 122; bun 40; creat 1.28; k+ 3.8; na++ 155; blood culture no growth; urine culture: no growth; HIV: nr; tsh 1.980 11-02-16: wbc 9.2; hgb 15.4; hct 46.5; mcv 89.4; plt 338; glucose 124; bun 32; creat 1.25; k+ 3.4; na++ 154; ast 48; total bili 1.7; albumin 2.8 11-03-16: wbc 7.1; hgb 13.1; hct 41.3; mcv 90.4; plt 288; glucose 99; bun 19; creat 1.18; k+ 3.0; na++ 146 mag 2.0; ammonia 49; vit B 12: 2265 11-04-16: wbc 9.1; hgb 12.7; hct  39.2; mcv 87.3; plt 294; glucose 93; bun 15; creat 0.82; k+ 3.8; na++ 140; ammonia 31 11-10-16: glucose 86; bun 13.3; creat 0.69; k+ 4.6; na++ 141; liver normal albumin 3.0  03-10-17: wbc 6.5; hgb 14.6; hct 44.6; mcv 92.5; plt 192;glucose 90; bun 15.0; creat 0.70; k+ 4.3; na++ 145; ca 91. 03-18-17: depakote: 12  05-13-17: wbc 12.8; hgb 15.6; hct 47.4; mcv 92.8; plt 263; glucose 152; bun 23;  creat 0.86; k+ 3.7; na++ 144; ca 9.2; liver normal albumin 3.0; lipase 15.  NO NEW LABS   Review of Systems  Unable to perform ROS: Dementia (nonverbal )    Physical Exam  Constitutional: No distress.  Frail   Neck: Neck supple.  Cardiovascular: Normal rate, regular rhythm and normal heart sounds.   Carotid bruit Pedal pulses faint   Pulmonary/Chest: Effort normal. No respiratory distress.  Rhonchi present   Abdominal: Soft. Bowel sounds are normal. He exhibits no distension. There is no tenderness.  Musculoskeletal: He exhibits no edema.  Has contractures  Lymphadenopathy:    He has no cervical adenopathy.  Neurological:  Is not aware   Skin: Skin is warm and dry. He is not diaphoretic.  Mottling present     ASSESSMENT/ PLAN:  TODAY:   1. Dementia 2. Failure to thrive 3. Left lower lobe pneumonia  Will complete abt;  Will focus his care on comfort Will setup a hospice care consult    MD is aware of resident's narcotic use and is in agreement with current plan of care. We will attempt to wean resident as apropriate     Ok Edwards NP [facility]  Contact [phone] Monday through Friday 8am- 5pm  After hours call [phone]

## 2017-06-01 ENCOUNTER — Encounter: Payer: Self-pay | Admitting: Adult Health

## 2017-06-01 ENCOUNTER — Non-Acute Institutional Stay (SKILLED_NURSING_FACILITY): Payer: Medicare Other | Admitting: Adult Health

## 2017-06-01 DIAGNOSIS — K219 Gastro-esophageal reflux disease without esophagitis: Secondary | ICD-10-CM

## 2017-06-01 DIAGNOSIS — E43 Unspecified severe protein-calorie malnutrition: Secondary | ICD-10-CM | POA: Diagnosis not present

## 2017-06-01 DIAGNOSIS — F0151 Vascular dementia with behavioral disturbance: Secondary | ICD-10-CM

## 2017-06-01 DIAGNOSIS — F01518 Vascular dementia, unspecified severity, with other behavioral disturbance: Secondary | ICD-10-CM

## 2017-06-01 NOTE — Progress Notes (Signed)
Location:   [place] Room Number: [place] of Service:  SNF (31)   CODE STATUS: DNR  No Known Allergies  Chief Complaint  Patient presents with  . Medical Management of Chronic Issues    Depression; gerd and dementia.     HPI:  He is a 83 year old long term resident of this facility being seen for the management of his chronic illnesses: depression; gerd and dementia. He is unable to participate in the hpi or ros. His weight is 122 pounds; previous weight 127 pounds. He has been treated for pneumonia this past month; which is contributing to his weight loss. There are no reports of aspiration present; there are no indications of pain present. There are no nursing concerns at this time.   Past Medical History:  Diagnosis Date  . Cancer [facility])    Prostate  . Dementia    Alzheimers  . GERD (gastroesophageal reflux disease)   . Heart murmur    systolic murmur no further eval d/t Alzheimers dx  . Hematuria   . Pulmonary nodule 08/17/2014  . Stroke [facility])    TIA    Past Surgical History:  Procedure Laterality Date  . BALLOON DILATION [name] 07/03/2014   Procedure: BALLOON DILATION;  Surgeon: Garlan Fair, MD;  Location[facility];  Service: Endoscopy;  Laterality: [name];  . BALLOON DILATION [name] 06/03/2015   Procedure: BALLOON DILATION;  Surgeon: Garlan Fair, MD;  [facility];  Service: Endoscopy;  Laterality: [name];  . CYSTOSCOPY [name] 11/07/2014   Procedure: CYSTOSCOPY;  Surgeon: Alexis Frock, MD;  [facility];  Service: Urology;  Laterality: [name];  . ESOPHAGOGASTRODUODENOSCOPY [name] 07/03/2014   Procedure: ESOPHAGOGASTRODUODENOSCOPY (EGD);  Surgeon: Garlan Fair, MD;  Location[facility];  Service: Endoscopy;  Laterality: [name];  . ESOPHAGOGASTRODUODENOSCOPY (EGD) WITH PROPOFOL [name] 06/03/2015   Procedure: ESOPHAGOGASTRODUODENOSCOPY (EGD) WITH PROPOFOL;  Surgeon: Garlan Fair, MD;  [facility];  Service: Endoscopy;  Laterality:  [name];  . HERNIA REPAIR  1970   double  . seed implant for prostate cancer  2006  . TRANSURETHRAL RESECTION OF PROSTATE [name] 11/07/2014   Procedure: TRANSURETHRAL VAPORIZATION OF THE PROSTATE AND BLADDER LESIONS WITH ERBE (NEW GYRUS) REMOVAL OF BLADDER STONE; PROSTATIC URETHRA BIOPSY;  Surgeon: Alexis Frock, MD;  [facility];  Service: Urology;  Laterality: [name];    Social History   Social History  . Marital status: Married    Spouse name: [name]  . Number of children: [name]  . Years of education: [name]   Occupational History  . Not on file.   Social History Main Topics  . Smoking status: Former Smoker    Packs/day: 1.00    Years: 10.00    Types: Cigarettes    Quit date: 08/31/1968  . Smokeless tobacco: Never Used  . Alcohol use No  . Drug use: No  . Sexual activity: No   Other Topics Concern  . Not on file   Social History Narrative  . No narrative on file   Family History  Problem Relation Age of Onset  . Heart disease Mother       VITAL SIGNS BP 122/66   Pulse (!) 104   Temp (!) 97.1 F (36.2 C)   Resp 18   Ht 5\' 6"  (1.676 m)   Wt 122 lb 11.2 oz (55.7 kg)   SpO2 96%   BMI 19.80 kg/m   Patient's Medications  New Prescriptions   No medications on  file  Previous Medications   ACETAMINOPHEN (TYLENOL) 500 MG TABLET    Take 500 mg by mouth every 6 (six) hours as needed for moderate pain.    ALPRAZOLAM (XANAX) 0.25 MG TABLET    Take 1 tablet (0.25 mg total) by mouth 2 (two) times daily. Give at 6am and 6pm   AMINO ACIDS-PROTEIN HYDROLYS (FEEDING SUPPLEMENT, PRO-STAT SUGAR FREE 64,) LIQD    Take 30 mLs by mouth every evening.    ASPIRIN 81 MG TABLET    Take 81 mg by mouth daily with breakfast.   CALCIUM-VITAMIN D (OSCAL WITH D) 500-200 MG-UNIT TABLET    Take 1 tablet by mouth 2 (two) times daily.   CITALOPRAM (CELEXA) 20 MG TABLET    Take 20 mg by mouth at bedtime.   DIVALPROEX (DEPAKOTE SPRINKLE) 125 MG CAPSULE    Take 125 mg by mouth 3 (three) times daily.     LOPERAMIDE (IMODIUM A-D) 2 MG TABLET    Take 2 mg by mouth every 2 (two) hours as needed for diarrhea or loose stools.   NUTRITIONAL SUPPLEMENT LIQD    Take 120 mLs by mouth 2 (two) times daily. House 2.0 - [facility]   OMEPRAZOLE (PRILOSEC OTC) 20 MG TABLET    Take 20 mg by mouth daily with breakfast.   ONDANSETRON 4 MG FILM    Take 4 mg by mouth every 8 (eight) hours as needed (nausea and vomiting).    OXYGEN    Place 2 L/min into the nose as needed (for shortness of breath). 2L/min via nasal cannula as needed for O2 sat less than or equal to 90%   Modified Medications   No medications on file  Discontinued Medications   No medications on file     SIGNIFICANT DIAGNOSTIC EXAMS  PREVIOUS  10-18-16: ct of head and cervical spine: 1. No evidence of significant acute traumatic injury to the skull, brain or cervical spine. 2. Mild cerebral atrophy with extensive chronic microvascular ischemic changes in the cerebral white matter redemonstrated. 3. Multilevel degenerative disc disease and cervical spondylosis, as above.  11-01-16: chest x-ray: Right lower lobe infiltrate.  03-09-17: chest x-ray: no hyperexpansion and clear lungs   05-13-17:  Patchy opacity at the left base, atelectasis versus infiltrate  NO NEW EXAMS     LABS REVIEWED: PREVIOUS  11-01-16: wbc 12.6; hgb 15.9; hct 48.5; mcv 89.5; plt 437; glucose 122; bun 40; creat 1.28; k+ 3.8; na++ 155; blood culture no growth; urine culture: no growth; HIV: nr; tsh 1.980 11-02-16: wbc 9.2; hgb 15.4; hct 46.5; mcv 89.4; plt 338; glucose 124; bun 32; creat 1.25; k+ 3.4; na++ 154; ast 48; total bili 1.7; albumin 2.8 11-03-16: wbc 7.1; hgb 13.1; hct 41.3; mcv 90.4; plt 288; glucose 99; bun 19; creat 1.18; k+ 3.0; na++ 146 mag 2.0; ammonia 49; vit B 12: 2265 11-04-16: wbc 9.1; hgb 12.7; hct 39.2; mcv 87.3; plt 294; glucose 93; bun 15; creat 0.82; k+ 3.8; na++ 140; ammonia 31 11-10-16: glucose 86; bun 13.3; creat 0.69; k+ 4.6; na++ 141; liver normal  albumin 3.0  03-10-17: wbc 6.5; hgb 14.6; hct 44.6; mcv 92.5; plt 192;glucose 90; bun 15.0; creat 0.70; k+ 4.3; na++ 145; ca 91. 03-18-17: depakote: 12  05-13-17: wbc 12.8; hgb 15.6; hct 47.4; mcv 92.8; plt 263; glucose 152; bun 23; creat 0.86; k+ 3.7; na++ 144; ca 9.2; liver normal albumin 3.0; lipase 15.  NO NEW LABS   Review of Systems  Unable to perform ROS:  Dementia (is nonverbal )     Physical Exam  Constitutional: No distress.  Frail   Neck: No thyromegaly present.  Carotid bruit   Cardiovascular: Normal rate, regular rhythm, normal heart sounds and intact distal pulses.   Pedal pulses faint   Pulmonary/Chest: Effort normal. No respiratory distress.  Breath sounds diminished   Abdominal: Soft. Bowel sounds are normal. He exhibits no distension. There is no tenderness.  Musculoskeletal: He exhibits no edema.  Has contractures   Lymphadenopathy:    He has no cervical adenopathy.  Neurological:  Is aware   Skin: Skin is warm and dry. He is not diaphoretic.   ASSESSMENT/ PLAN:  TODAY:   1.Depression with anxiety: is stable  is presently on celexa 20 mg daily. Will continue depakote 125 mg three times daily to help stabilize mood and will continue xanax 0.25 mg twice daily   2. Gerd: has history of balloon dilatation: is stable  will continue prilosec 20 mg daily  3. Dementia: no significant change in his status: his weight is 122 Pounds: is currently off medications; will monitor .  PREVIOUS:     4. Protein calorie malnutrition: no significant change in status:  weight is 122  pounds; albumin 3.0; will continue prostat  30 cc prior to supper   5. Chronic pain: no indications of pain present. Will continue tylenol 500 mg every 6 hours as needed and will monitor   6. Vit B deficiency: vit B 12: 2655;is stable  will monitor    MD is aware of resident's narcotic use and is in agreement with current plan of care. We will attempt to wean resident as apropriate      Ok Edwards NP [facility] Adult Medicine  Contact [phone] Monday through Friday 8am- 5pm  After hours call [phone]

## 2017-06-03 ENCOUNTER — Other Ambulatory Visit: Payer: Self-pay

## 2017-06-03 MED ORDER — ALPRAZOLAM 0.25 MG PO TABS: 0.2500 mg | ORAL_TABLET | Freq: Two times a day (BID) | ORAL | 0 refills | Status: DC

## 2017-06-03 NOTE — Telephone Encounter (Signed)
RX faxed to [facility] @ [phone], phone number [phone] 

## 2017-06-09 DIAGNOSIS — R627 Adult failure to thrive: Secondary | ICD-10-CM | POA: Diagnosis not present

## 2017-06-11 DIAGNOSIS — Z8546 Personal history of malignant neoplasm of prostate: Secondary | ICD-10-CM | POA: Diagnosis not present

## 2017-06-11 DIAGNOSIS — R1310 Dysphagia, unspecified: Secondary | ICD-10-CM | POA: Diagnosis not present

## 2017-06-11 DIAGNOSIS — F411 Generalized anxiety disorder: Secondary | ICD-10-CM | POA: Diagnosis not present

## 2017-06-11 DIAGNOSIS — F339 Major depressive disorder, recurrent, unspecified: Secondary | ICD-10-CM | POA: Diagnosis not present

## 2017-06-11 DIAGNOSIS — F0281 Dementia in other diseases classified elsewhere with behavioral disturbance: Secondary | ICD-10-CM | POA: Diagnosis not present

## 2017-06-11 DIAGNOSIS — I679 Cerebrovascular disease, unspecified: Secondary | ICD-10-CM | POA: Diagnosis not present

## 2017-06-11 DIAGNOSIS — K219 Gastro-esophageal reflux disease without esophagitis: Secondary | ICD-10-CM | POA: Diagnosis not present

## 2017-06-11 DIAGNOSIS — M2450 Contracture, unspecified joint: Secondary | ICD-10-CM | POA: Diagnosis not present

## 2017-06-11 DIAGNOSIS — G309 Alzheimer's disease, unspecified: Secondary | ICD-10-CM | POA: Diagnosis not present

## 2017-06-11 DIAGNOSIS — F062 Psychotic disorder with delusions due to known physiological condition: Secondary | ICD-10-CM | POA: Diagnosis not present

## 2017-06-15 DIAGNOSIS — I679 Cerebrovascular disease, unspecified: Secondary | ICD-10-CM | POA: Diagnosis not present

## 2017-06-15 DIAGNOSIS — F339 Major depressive disorder, recurrent, unspecified: Secondary | ICD-10-CM | POA: Diagnosis not present

## 2017-06-15 DIAGNOSIS — M2450 Contracture, unspecified joint: Secondary | ICD-10-CM | POA: Diagnosis not present

## 2017-06-15 DIAGNOSIS — G309 Alzheimer's disease, unspecified: Secondary | ICD-10-CM | POA: Diagnosis not present

## 2017-06-15 DIAGNOSIS — F0281 Dementia in other diseases classified elsewhere with behavioral disturbance: Secondary | ICD-10-CM | POA: Diagnosis not present

## 2017-06-15 DIAGNOSIS — R1310 Dysphagia, unspecified: Secondary | ICD-10-CM | POA: Diagnosis not present

## 2017-06-22 DIAGNOSIS — F0281 Dementia in other diseases classified elsewhere with behavioral disturbance: Secondary | ICD-10-CM | POA: Diagnosis not present

## 2017-06-22 DIAGNOSIS — M2450 Contracture, unspecified joint: Secondary | ICD-10-CM | POA: Diagnosis not present

## 2017-06-22 DIAGNOSIS — R1310 Dysphagia, unspecified: Secondary | ICD-10-CM | POA: Diagnosis not present

## 2017-06-22 DIAGNOSIS — I679 Cerebrovascular disease, unspecified: Secondary | ICD-10-CM | POA: Diagnosis not present

## 2017-06-22 DIAGNOSIS — F339 Major depressive disorder, recurrent, unspecified: Secondary | ICD-10-CM | POA: Diagnosis not present

## 2017-06-22 DIAGNOSIS — G309 Alzheimer's disease, unspecified: Secondary | ICD-10-CM | POA: Diagnosis not present

## 2017-06-28 DIAGNOSIS — I679 Cerebrovascular disease, unspecified: Secondary | ICD-10-CM | POA: Diagnosis not present

## 2017-06-28 DIAGNOSIS — F339 Major depressive disorder, recurrent, unspecified: Secondary | ICD-10-CM | POA: Diagnosis not present

## 2017-06-28 DIAGNOSIS — M2450 Contracture, unspecified joint: Secondary | ICD-10-CM | POA: Diagnosis not present

## 2017-06-28 DIAGNOSIS — G309 Alzheimer's disease, unspecified: Secondary | ICD-10-CM | POA: Diagnosis not present

## 2017-06-28 DIAGNOSIS — R1310 Dysphagia, unspecified: Secondary | ICD-10-CM | POA: Diagnosis not present

## 2017-06-28 DIAGNOSIS — F0281 Dementia in other diseases classified elsewhere with behavioral disturbance: Secondary | ICD-10-CM | POA: Diagnosis not present

## 2017-06-29 ENCOUNTER — Non-Acute Institutional Stay (SKILLED_NURSING_FACILITY): Payer: Medicare Other | Admitting: Adult Health

## 2017-06-29 ENCOUNTER — Encounter: Payer: Self-pay | Admitting: Adult Health

## 2017-06-29 DIAGNOSIS — M2450 Contracture, unspecified joint: Secondary | ICD-10-CM | POA: Diagnosis not present

## 2017-06-29 DIAGNOSIS — F0151 Vascular dementia with behavioral disturbance: Secondary | ICD-10-CM

## 2017-06-29 DIAGNOSIS — F339 Major depressive disorder, recurrent, unspecified: Secondary | ICD-10-CM | POA: Diagnosis not present

## 2017-06-29 DIAGNOSIS — R1310 Dysphagia, unspecified: Secondary | ICD-10-CM | POA: Diagnosis not present

## 2017-06-29 DIAGNOSIS — G309 Alzheimer's disease, unspecified: Secondary | ICD-10-CM | POA: Diagnosis not present

## 2017-06-29 DIAGNOSIS — R627 Adult failure to thrive: Secondary | ICD-10-CM

## 2017-06-29 DIAGNOSIS — I679 Cerebrovascular disease, unspecified: Secondary | ICD-10-CM | POA: Diagnosis not present

## 2017-06-29 DIAGNOSIS — F0281 Dementia in other diseases classified elsewhere with behavioral disturbance: Secondary | ICD-10-CM | POA: Diagnosis not present

## 2017-06-29 DIAGNOSIS — F01518 Vascular dementia, unspecified severity, with other behavioral disturbance: Secondary | ICD-10-CM

## 2017-07-01 NOTE — Progress Notes (Signed)
Location:   [place] Room Number: [place] of Service:  SNF (31)   CODE STATUS: DNR  No Known Allergies  Chief Complaint  Patient presents with  . Acute Visit    End of Life    HPI:  He is at end of life. He continues to be followed by hospice care. He is unresponsive to stimuli; is having periods of apnea present. He is slightly diaphoretic; has mottling in his feet.  Obviously unable to participate in the hpi or ros. The goal of his care is comfort only; his status is terminal.    Past Medical History:  Diagnosis Date  . Cancer [facility])    Prostate  . Dementia    Alzheimers  . GERD (gastroesophageal reflux disease)   . Heart murmur    systolic murmur no further eval d/t Alzheimers dx  . Hematuria   . Pulmonary nodule 08/17/2014  . Stroke [facility])    TIA    Past Surgical History:  Procedure Laterality Date  . BALLOON DILATION [name] 07/03/2014   Procedure: BALLOON DILATION;  Surgeon: Garlan Fair, MD;  Location: [facility];  Service: Endoscopy;  Laterality: [name];  . BALLOON DILATION [name] 06/03/2015   Procedure: BALLOON DILATION;  Surgeon: Garlan Fair, MD;  Location: [facility] ENDOSCOPY;  Service: Endoscopy;  Laterality: [name];  . CYSTOSCOPY [name] 11/07/2014   Procedure: CYSTOSCOPY;  Surgeon: Alexis Frock, MD;  Location: [facility] ORS;  Service: Urology;  Laterality: [name];  . ESOPHAGOGASTRODUODENOSCOPY [name] 07/03/2014   Procedure: ESOPHAGOGASTRODUODENOSCOPY (EGD);  Surgeon: Garlan Fair, MD;  Location: [facility];  Service: Endoscopy;  Laterality: [name];  . ESOPHAGOGASTRODUODENOSCOPY (EGD) WITH PROPOFOL [name] 06/03/2015   Procedure: ESOPHAGOGASTRODUODENOSCOPY (EGD) WITH PROPOFOL;  Surgeon: Garlan Fair, MD;  Location: [facility] ENDOSCOPY;  Service: Endoscopy;  Laterality: [name];  . HERNIA REPAIR  1970   double  . seed implant for prostate cancer  2006  . TRANSURETHRAL RESECTION OF PROSTATE [name] 11/07/2014   Procedure: TRANSURETHRAL VAPORIZATION OF THE PROSTATE AND BLADDER  LESIONS WITH ERBE (NEW GYRUS) REMOVAL OF BLADDER STONE; PROSTATIC URETHRA BIOPSY;  Surgeon: Alexis Frock, MD;  Location: [facility] ORS;  Service: Urology;  Laterality: [name];    Social History   Social History  . Marital status: Married    Spouse name: [name]  . Number of children: [name]  . Years of education: [name]   Occupational History  . Not on file.   Social History Main Topics  . Smoking status: Former Smoker    Packs/day: 1.00    Years: 10.00    Types: Cigarettes    Quit date: 08/31/1968  . Smokeless tobacco: Never Used  . Alcohol use No  . Drug use: No  . Sexual activity: No   Other Topics Concern  . Not on file   Social History Narrative  . No narrative on file   Family History  Problem Relation Age of Onset  . Heart disease Mother       VITAL SIGNS BP (!) 82/58   Pulse (!) 107   Temp (!) 101.4 F (38.6 C)   Resp (!) 44   Ht 5\' 6"  (1.676 m)   Wt 124 lb 12.8 oz (56.6 kg)   SpO2 (!) 81%   BMI 20.14 kg/m   Patient's Medications  New Prescriptions   No medications on file  Previous Medications   ACETAMINOPHEN (TYLENOL) 500 MG TABLET    Take 500 mg by mouth every 6 (six) hours as needed for moderate  pain.    LORAZEPAM (ATIVAN) 1 MG TABLET    Take 1 mg by mouth every 2 (two) hours as needed for anxiety.   LORAZEPAM (ATIVAN) 1 MG TABLET    Take 1 mg by mouth every 4 (four) hours as needed for anxiety.   MORPHINE SULFATE (MORPHINE CONCENTRATE) 10 MG / 0.5 ML CONCENTRATED SOLUTION    Give 5 mg by mouth every 1-2 hours as needed for pain and respiratory distess   ONDANSETRON 4 MG FILM    Take 4 mg by mouth every 8 (eight) hours as needed (nausea and vomiting).    OXYGEN    Place 2 L/min into the nose as needed (for shortness of breath). 2L/min via nasal cannula as needed for O2 sat less than or equal to 90%   Modified Medications   No medications on file  Discontinued Medications   ALPRAZOLAM (XANAX) 0.25 MG TABLET    Take 1 tablet (0.25 mg total) by mouth 2 (two) times  daily. Give at 6am and 6pm   AMINO ACIDS-PROTEIN HYDROLYS (FEEDING SUPPLEMENT, PRO-STAT SUGAR FREE 64,) LIQD    Take 30 mLs by mouth every evening.    ASPIRIN 81 MG TABLET    Take 81 mg by mouth daily with breakfast.   CALCIUM-VITAMIN D (OSCAL WITH D) 500-200 MG-UNIT TABLET    Take 1 tablet by mouth 2 (two) times daily.   CITALOPRAM (CELEXA) 20 MG TABLET    Take 20 mg by mouth at bedtime.   DIVALPROEX (DEPAKOTE SPRINKLE) 125 MG CAPSULE    Take 125 mg by mouth 3 (three) times daily.    LOPERAMIDE (IMODIUM A-D) 2 MG TABLET    Take 2 mg by mouth every 2 (two) hours as needed for diarrhea or loose stools.   NUTRITIONAL SUPPLEMENT LIQD    Take 120 mLs by mouth 2 (two) times daily. House 2.0 - [employer]   OMEPRAZOLE (PRILOSEC OTC) 20 MG TABLET    Take 20 mg by mouth daily with breakfast.     SIGNIFICANT DIAGNOSTIC EXAMS  PREVIOUS  10-18-16: ct of head and cervical spine: 1. No evidence of significant acute traumatic injury to the skull, brain or cervical spine. 2. Mild cerebral atrophy with extensive chronic microvascular ischemic changes in the cerebral white matter redemonstrated. 3. Multilevel degenerative disc disease and cervical spondylosis, as above.  11-01-16: chest x-ray: Right lower lobe infiltrate.  03-09-17: chest x-ray: no hyperexpansion and clear lungs   05-13-17:  Patchy opacity at the left base, atelectasis versus infiltrate  NO NEW EXAMS     LABS REVIEWED: PREVIOUS  11-01-16: wbc 12.6; hgb 15.9; hct 48.5; mcv 89.5; plt 437; glucose 122; bun 40; creat 1.28; k+ 3.8; na++ 155; blood culture no growth; urine culture: no growth; HIV: nr; tsh 1.980 11-02-16: wbc 9.2; hgb 15.4; hct 46.5; mcv 89.4; plt 338; glucose 124; bun 32; creat 1.25; k+ 3.4; na++ 154; ast 48; total bili 1.7; albumin 2.8 11-03-16: wbc 7.1; hgb 13.1; hct 41.3; mcv 90.4; plt 288; glucose 99; bun 19; creat 1.18; k+ 3.0; na++ 146 mag 2.0; ammonia 49; vit B 12: 2265 11-04-16: wbc 9.1; hgb 12.7; hct 39.2; mcv 87.3; plt 294;  glucose 93; bun 15; creat 0.82; k+ 3.8; na++ 140; ammonia 31 11-10-16: glucose 86; bun 13.3; creat 0.69; k+ 4.6; na++ 141; liver normal albumin 3.0  03-10-17: wbc 6.5; hgb 14.6; hct 44.6; mcv 92.5; plt 192;glucose 90; bun 15.0; creat 0.70; k+ 4.3; na++ 145; ca 91. 03-18-17: depakote: 12  05-13-17: wbc 12.8; hgb 15.6; hct 47.4; mcv 92.8; plt 263; glucose 152; bun 23; creat 0.86; k+ 3.7; na++ 144; ca 9.2; liver normal albumin 3.0; lipase 15.  NO NEW LABS   Review of Systems  Unable to perform ROS: Dementia (unresponsive )   Physical Exam  Constitutional: No distress.  Frail   Neck: Carotid bruit is present.  Cardiovascular: Normal rate, regular rhythm, normal heart sounds and intact distal pulses.   Pulmonary/Chest: Effort normal and breath sounds normal. No respiratory distress.  Abdominal: Soft. Bowel sounds are normal. He exhibits no distension. There is no tenderness.  Musculoskeletal: He exhibits no edema.  Has contractures   Lymphadenopathy:    He has no cervical adenopathy.  Neurological:  unresponsive   Skin: Skin is warm. He is diaphoretic.        ASSESSMENT/ PLAN:  TODAY:  1. Vascular dementia  2. Failure to thrive in adult  His routine medications have been stopped He has roxanol 5 mg every 4 hours as needed Will use tylenol supp every 6 hours routinely Liquid tears four times daily   Will continue to focus upon his comfort    MD is aware of resident's narcotic use and is in agreement with current plan of care. We will attempt to wean resident as apropriate     Jacob Edwards NP [facility]  Contact [phone] Monday through Friday 8am- 5pm  After hours call [phone]

## 2017-07-01 DEATH — deceased
